# Patient Record
Sex: Female | Born: 1975 | Race: Black or African American | Hispanic: No | Marital: Married | Smoking: Former smoker
Health system: Southern US, Community
[De-identification: ages and names within clinical notes are randomized; demographics above are authoritative.]

## PROBLEM LIST (undated history)

## (undated) DIAGNOSIS — F329 Major depressive disorder, single episode, unspecified: Secondary | ICD-10-CM

## (undated) DIAGNOSIS — Z87440 Personal history of urinary (tract) infections: Secondary | ICD-10-CM

## (undated) DIAGNOSIS — J45909 Unspecified asthma, uncomplicated: Secondary | ICD-10-CM

## (undated) DIAGNOSIS — G4700 Insomnia, unspecified: Secondary | ICD-10-CM

## (undated) DIAGNOSIS — F419 Anxiety disorder, unspecified: Secondary | ICD-10-CM

## (undated) DIAGNOSIS — E559 Vitamin D deficiency, unspecified: Secondary | ICD-10-CM

## (undated) DIAGNOSIS — F411 Generalized anxiety disorder: Secondary | ICD-10-CM

## (undated) DIAGNOSIS — B009 Herpesviral infection, unspecified: Secondary | ICD-10-CM

## (undated) DIAGNOSIS — F32A Depression, unspecified: Secondary | ICD-10-CM

## (undated) DIAGNOSIS — Z8619 Personal history of other infectious and parasitic diseases: Secondary | ICD-10-CM

## (undated) DIAGNOSIS — E611 Iron deficiency: Secondary | ICD-10-CM

## (undated) DIAGNOSIS — D649 Anemia, unspecified: Secondary | ICD-10-CM

## (undated) DIAGNOSIS — E119 Type 2 diabetes mellitus without complications: Secondary | ICD-10-CM

## (undated) DIAGNOSIS — F3160 Bipolar disorder, current episode mixed, unspecified: Secondary | ICD-10-CM

## (undated) HISTORY — DX: Type 2 diabetes mellitus without complications: E11.9

## (undated) HISTORY — DX: Major depressive disorder, single episode, unspecified: F32.9

## (undated) HISTORY — DX: Personal history of other infectious and parasitic diseases: Z86.19

## (undated) HISTORY — DX: Herpesviral infection, unspecified: B00.9

## (undated) HISTORY — DX: Insomnia, unspecified: G47.00

## (undated) HISTORY — PX: BELPHAROPTOSIS REPAIR: SHX369

## (undated) HISTORY — DX: Vitamin D deficiency, unspecified: E55.9

## (undated) HISTORY — DX: Anxiety disorder, unspecified: F41.9

## (undated) HISTORY — DX: Iron deficiency: E61.1

## (undated) HISTORY — DX: Depression, unspecified: F32.A

## (undated) HISTORY — DX: Personal history of urinary (tract) infections: Z87.440

## (undated) HISTORY — DX: Anemia, unspecified: D64.9

---
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
## 1999-01-15 HISTORY — PX: DILATION AND CURETTAGE OF UTERUS: SHX78

## 2004-01-15 HISTORY — PX: TUBAL LIGATION: SHX77

## 2004-01-15 HISTORY — PX: ENDOMETRIAL ABLATION: SHX621

## 2013-05-05 ENCOUNTER — Ambulatory Visit (INDEPENDENT_AMBULATORY_CARE_PROVIDER_SITE_OTHER): Payer: 59 | Admitting: Adult Health

## 2013-05-05 ENCOUNTER — Telehealth: Payer: Self-pay | Admitting: Adult Health

## 2013-05-05 ENCOUNTER — Encounter: Payer: Self-pay | Admitting: Adult Health

## 2013-05-05 VITALS — BP 106/78 | HR 68 | Temp 98.0°F | Resp 14 | Ht 61.8 in | Wt 143.0 lb

## 2013-05-05 DIAGNOSIS — Z716 Tobacco abuse counseling: Secondary | ICD-10-CM | POA: Insufficient documentation

## 2013-05-05 DIAGNOSIS — F17200 Nicotine dependence, unspecified, uncomplicated: Secondary | ICD-10-CM

## 2013-05-05 DIAGNOSIS — Z7189 Other specified counseling: Secondary | ICD-10-CM

## 2013-05-05 DIAGNOSIS — Z8742 Personal history of other diseases of the female genital tract: Secondary | ICD-10-CM | POA: Insufficient documentation

## 2013-05-05 NOTE — Progress Notes (Signed)
Pre visit review using our clinic review tool, if applicable. No additional management support is needed unless otherwise documented below in the visit note. 

## 2013-05-05 NOTE — Progress Notes (Signed)
Patient ID: Jacqueline Padilla, female   DOB: Aug 20, 1975, 38 y.o.   MRN: [id number]   Subjective:    Patient ID: Jacqueline Padilla, female    DOB: Aug 20, 1975, 38 y.o.   MRN: [id number]  HPI Pt is a pleasant 38 y/o female who presents to clinic to establish care. She has moved to the [place] area approximately 1 year ago from [place]. Will request her records. Reports hx of abnormal PAP s/p endometrial ablation. She has had normal PAPs since. Last one was approximately 2012. She will schedule her complete physical exam. Ongoing tobacco abuse and is ready to quit. She is joining a group at her employer to stop smoking.    Past Medical History  Diagnosis Date  . History of chicken pox   . History of urinary tract infection      Past Surgical History  Procedure Laterality Date  . Dilation and curettage of uterus  2001    s/p miscarriage  . Tonsillectomy and adenoidectomy  1994  . Tubal ligation  2006  . Endometrial ablation  2006    hx of abnormal pap     Family History  Problem Relation Age of Onset  . Diabetes Mother   . Hypertension Father   . Stroke Maternal Grandmother   . Cancer Maternal Grandfather     Lung cancer - non smoker  . Cancer Paternal Grandmother     Breast cancer     History   Social History  . Marital Status: Married    Spouse Name: N/A    Number of Children: 1  . Years of Education: 15   Occupational History  . Unit [employer]     [employer]  . Residential Assistant     [employer]   Social History Main Topics  . Smoking status: Light Tobacco Smoker -- 0.25 packs/day for 18 years  . Smokeless tobacco: Not on file     Comment: She is going to [facility] "commit [facility] quit" [facility]  . Alcohol Use: Yes     Comment: Occassional glass of wine  . Drug Use: No  . Sexual Activity: Yes    Birth Control/ Protection: Surgical   Other Topics Concern  . Not on file   Social History Narrative   Jacqueline Padilla grew up in [place]. She lives in  [place] with her Husband and their son. Her husband has 3 daughters that live with them as well. 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 She enjoys spending time with the family, outdoor activities, shopping.      Exercise - not currently   Caffeine - 1 cup daily    Review of Systems  Constitutional: Negative.   HENT: Negative.   Eyes: Negative.   Respiratory: Negative.   Cardiovascular: Negative.   Gastrointestinal: Negative.   Endocrine: Negative.   Genitourinary: Negative.   Musculoskeletal: Negative.   Skin: Negative.   Allergic/Immunologic: Negative.   Neurological: Negative.   Hematological: Negative.   Psychiatric/Behavioral: Negative.        Objective:  BP 106/78  Pulse 68  Temp(Src) 98 F (36.7 C) (Oral)  Resp 14  Ht 5' 1.8" (1.57 m)  Wt 143 lb (64.864 kg)  BMI 26.32 kg/m2  SpO2 99%  LMP 03/25/2013   Physical Exam  Constitutional: She is oriented to person, place, and time. She appears well-developed and well-nourished. No distress.  HENT:  Head: Normocephalic and  atraumatic.  Right Ear: External ear normal.  Left Ear: External ear normal.  Nose: Nose normal.  Mouth/Throat: Oropharynx is clear and moist.  Eyes: Conjunctivae and EOM are normal. Pupils are equal, round, and reactive to light.  Neck: Normal range of motion. Neck supple.  Cardiovascular: Normal rate, regular rhythm, normal heart sounds and intact distal pulses.  Exam reveals no gallop and no friction rub.   No murmur heard. Pulmonary/Chest: Effort normal and breath sounds normal. No respiratory distress. She has no wheezes. She has no rales.  Musculoskeletal: Normal range of motion.  Neurological: She is alert and oriented to person, place, and time. Coordination normal.  Skin: Skin is warm and dry.  Psychiatric: She has a normal mood and affect. Her behavior is normal. Judgment and thought content normal.        Assessment & Plan:   1. Tobacco abuse counseling She has been a light smoker for 18 years. She has considered Chantix in the past but never followed through. Discussed importance of smoking cessation for her overall health and disease prevention. 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 Continue to encourage. She can consider trying chantix if she decides.  2. Hx of abnormal cervical Pap smear Hx of abnormal PAP s/p endometrial ablation. Will request records. She reports normal PAP following diagnosis. Last PAP 2012. Jacqueline Padilla will schedule a complete physical including PAP.

## 2013-05-05 NOTE — Telephone Encounter (Signed)
Relevant patient education mailed to patient.  

## 2013-05-05 NOTE — Patient Instructions (Signed)
   Thank you for choosing [place] [facility].  Please schedule your complete physical including PAP. I will check labs so you will need to be fasting.  Remember to activate your MyChart Account.

## 2013-05-21 ENCOUNTER — Encounter: Payer: 59 | Admitting: Adult Health

## 2013-11-22 ENCOUNTER — Telehealth: Payer: Self-pay

## 2013-11-22 NOTE — Telephone Encounter (Signed)
Called pt to set up with C.Doss as a new pt.  No answer, lvmom.

## 2014-01-28 ENCOUNTER — Encounter: Payer: 59 | Admitting: Nurse Practitioner

## 2014-02-07 ENCOUNTER — Encounter: Payer: 59 | Admitting: Nurse Practitioner

## 2014-02-14 ENCOUNTER — Encounter: Payer: 59 | Admitting: Nurse Practitioner

## 2014-03-07 ENCOUNTER — Other Ambulatory Visit (HOSPITAL_COMMUNITY)
Admission: RE | Admit: 2014-03-07 | Discharge: 2014-03-07 | Disposition: A | Discharge status: Home / Self Care | Payer: 59 | Source: Ambulatory Visit | Attending: Nurse Practitioner | Admitting: Nurse Practitioner

## 2014-03-07 ENCOUNTER — Encounter: Payer: Self-pay | Admitting: Nurse Practitioner

## 2014-03-07 ENCOUNTER — Ambulatory Visit (INDEPENDENT_AMBULATORY_CARE_PROVIDER_SITE_OTHER): Payer: 59 | Admitting: Nurse Practitioner

## 2014-03-07 VITALS — BP 110/80 | HR 63 | Temp 98.3°F | Resp 12 | Ht 61.8 in | Wt 151.8 lb

## 2014-03-07 DIAGNOSIS — Z01419 Encounter for gynecological examination (general) (routine) without abnormal findings: Secondary | ICD-10-CM | POA: Diagnosis not present

## 2014-03-07 DIAGNOSIS — Z716 Tobacco abuse counseling: Secondary | ICD-10-CM

## 2014-03-07 DIAGNOSIS — Z0000 Encounter for general adult medical examination without abnormal findings: Secondary | ICD-10-CM

## 2014-03-07 DIAGNOSIS — Z1151 Encounter for screening for human papillomavirus (HPV): Secondary | ICD-10-CM | POA: Diagnosis present

## 2014-03-07 DIAGNOSIS — N390 Urinary tract infection, site not specified: Secondary | ICD-10-CM

## 2014-03-07 LAB — POCT URINALYSIS DIPSTICK
BILIRUBIN UA: NEGATIVE
Blood, UA: NEGATIVE
Glucose, UA: NEGATIVE
Ketones, UA: NEGATIVE
Nitrite, UA: NEGATIVE
Spec Grav, UA: 1.01
Urobilinogen, UA: 0.2
pH, UA: 6

## 2014-03-07 MED ORDER — SULFAMETHOXAZOLE-TRIMETHOPRIM 800-160 MG PO TABS: 1.0000 | ORAL_TABLET | Freq: Two times a day (BID) | ORAL | Status: DC

## 2014-03-07 NOTE — Patient Instructions (Signed)

## 2014-03-07 NOTE — Progress Notes (Signed)
Subjective:    Patient ID: Jacqueline Padilla, female    DOB: [birth year], 39 y.o.   MRN: [id number]  HPI  Jacqueline Padilla is a 39 yo female here for an annual physical.   1) Health Maintenance-   Diet- No change  Exercise- Have not been to [employer] this month   Immunizations- Up to date  Pap- 4 years ago was normal, will do today   Eye Exam- Last year 2015  Dental Exam- Up to date   LMP- 02/13/2014 5 days   2) Chronic Problems-  Tobacco use- wants to stop smoking completely. Smokes in the car. When she gets really stressed, discussed going cold [place]key or using gum   3) Acute problems- Feels she may have a UTI, frequency and dysuria.   Review of Systems  Constitutional: Negative for fever, chills, diaphoresis, fatigue and unexpected weight change.  HENT: Negative for tinnitus and trouble swallowing.   Eyes: Negative for visual disturbance.  Respiratory: Negative for chest tightness, shortness of breath and wheezing.   Cardiovascular: Negative for chest pain, palpitations and leg swelling.  Gastrointestinal: Negative for nausea, vomiting, abdominal pain, diarrhea, constipation and blood in stool.  Endocrine: Negative for polydipsia, polyphagia and polyuria.  Genitourinary: Positive for dysuria. Negative for hematuria, vaginal discharge and vaginal pain.  Musculoskeletal: Negative for myalgias, back pain, arthralgias and gait problem.  Skin: Negative for color change and rash.  Neurological: Negative for dizziness, weakness, numbness and headaches.  Hematological: Does not bruise/bleed easily.  Psychiatric/Behavioral: Negative for suicidal ideas and sleep disturbance. The patient is not nervous/anxious.    Past Medical History  Diagnosis Date  . History of chicken pox   . History of urinary tract infection     History   Social History  . Marital Status: Married    Spouse Name: N/A  . Number of Children: 1  . Years of Education: 15   Occupational History  . Unit [employer]    [employer]  . Residential Assistant     [employer]   Social History Main Topics  . Smoking status: Light Tobacco Smoker -- 0.25 packs/day for 18 years  . Smokeless tobacco: Not on file     Comment: She is going to join "commit [facility] quit" at [facility]  . Alcohol Use: 0.0 oz/week    0 Standard drinks or equivalent per week     Comment: Occassional glass of wine  . Drug Use: No  . Sexual Activity:    Partners: Male    Birth Control/ Protection: Surgical     Comment: Husband   Other Topics Concern  . Not on file   Social History Narrative   [name] grew up [place]. She lives in [place] with her Husband and their son. Her husband has 3 daughters that live with them as well. 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 She enjoys spending time with the family, outdoor activities, shopping.      Exercise - not currently   Caffeine - 1 cup daily    Past Surgical History  Procedure Laterality Date  . Dilation and curettage of uterus  2001    s/p miscarriage  . Tonsillectomy and adenoidectomy  1994  . Tubal ligation  2006  . Endometrial ablation  2006    hx of abnormal pap    Family History  Problem Relation Age of Onset  . Diabetes Mother   .  Hypertension Father   . Stroke Maternal Grandmother   . Cancer Maternal Grandfather     Lung cancer - non smoker  . Cancer Paternal Grandmother     Breast cancer    No Known Allergies  Current Outpatient Prescriptions on File Prior to Visit  Medication Sig Dispense Refill  . cyclobenzaprine (FLEXERIL) 10 MG tablet Take 10 mg by mouth 3 (three) times daily as needed for muscle spasms.    . Multiple Vitamins-Minerals (MULTIVITAMIN GUMMIES ADULT PO) Take by mouth.     No current facility-administered medications on file prior to visit.      Objective:   Physical Exam  Constitutional: She is oriented to person, place, and time. She appears  well-developed and well-nourished. No distress.  BP 110/80 mmHg  Pulse 63  Temp(Src) 98.3 F (36.8 C) (Oral)  Resp 12  Ht 5' 1.8" (1.57 m)  Wt 151 lb 12.8 oz (68.856 kg)  BMI 27.93 kg/m2  SpO2 98%  LMP  (Approximate)   HENT:  Head: Normocephalic and atraumatic.  Right Ear: External ear normal.  Left Ear: External ear normal.  Nose: Nose normal.  Mouth/Throat: Oropharynx is clear and moist. No oropharyngeal exudate.  TMs and canals clear bilaterally  Eyes: Conjunctivae and EOM are normal. Pupils are equal, round, and reactive to light. Right eye exhibits no discharge. Left eye exhibits no discharge. No scleral icterus.  Neck: Normal range of motion. Neck supple. No thyromegaly present.  Cardiovascular: Normal rate, regular rhythm, normal heart sounds and intact distal pulses.  Exam reveals no gallop and no friction rub.   No murmur heard. Pulmonary/Chest: Effort normal and breath sounds normal. No respiratory distress. She has no wheezes. She has no rales. She exhibits no tenderness.  Abdominal: Soft. Bowel sounds are normal. She exhibits no distension and no mass. There is no tenderness. There is no rebound and no guarding.  Musculoskeletal: Normal range of motion. She exhibits no edema or tenderness.  Lymphadenopathy:    She has no cervical adenopathy.  Neurological: She is alert and oriented to person, place, and time. She has normal reflexes. No cranial nerve deficit. She exhibits normal muscle tone. Coordination normal.  Skin: Skin is warm and dry. No rash noted. She is not diaphoretic. No erythema. No pallor.  Psychiatric: She has a normal mood and affect. Her behavior is normal. Judgment and thought content normal.      Assessment & Plan:

## 2014-03-07 NOTE — Progress Notes (Signed)
Pre visit review using our clinic review tool, if applicable. No additional management support is needed unless otherwise documented below in the visit note. 

## 2014-03-08 DIAGNOSIS — N390 Urinary tract infection, site not specified: Secondary | ICD-10-CM | POA: Insufficient documentation

## 2014-03-08 DIAGNOSIS — Z0000 Encounter for general adult medical examination without abnormal findings: Secondary | ICD-10-CM | POA: Insufficient documentation

## 2014-03-08 DIAGNOSIS — E119 Type 2 diabetes mellitus without complications: Secondary | ICD-10-CM | POA: Insufficient documentation

## 2014-03-08 LAB — CBC WITH DIFFERENTIAL/PLATELET
BASOS PCT: 0.7 % (ref 0.0–3.0)
Basophils Absolute: 0 10*3/uL (ref 0.0–0.1)
Eosinophils Absolute: 0.1 10*3/uL (ref 0.0–0.7)
Eosinophils Relative: 2 % (ref 0.0–5.0)
HEMATOCRIT: 33.7 % — AB (ref 36.0–46.0)
Hemoglobin: 10.7 g/dL — ABNORMAL LOW (ref 12.0–15.0)
Lymphocytes Relative: 32.8 % (ref 12.0–46.0)
Lymphs Abs: 1.7 10*3/uL (ref 0.7–4.0)
MCHC: 31.7 g/dL (ref 30.0–36.0)
MCV: 65.5 fl — ABNORMAL LOW (ref 78.0–100.0)
MONOS PCT: 5.8 % (ref 3.0–12.0)
Monocytes Absolute: 0.3 10*3/uL (ref 0.1–1.0)
NEUTROS ABS: 3.1 10*3/uL (ref 1.4–7.7)
Neutrophils Relative %: 58.7 % (ref 43.0–77.0)
PLATELETS: 263 10*3/uL (ref 150.0–400.0)
RBC: 5.14 Mil/uL — ABNORMAL HIGH (ref 3.87–5.11)
RDW: 19.8 % — ABNORMAL HIGH (ref 11.5–15.5)
WBC: 5.3 10*3/uL (ref 4.0–10.5)

## 2014-03-08 LAB — LIPID PANEL
Cholesterol: 202 mg/dL — ABNORMAL HIGH (ref 0–200)
HDL: 57 mg/dL (ref >39.00)
LDL Cholesterol: 118 mg/dL — ABNORMAL HIGH (ref 0–99)
NONHDL: 145
TRIGLYCERIDES: 134 mg/dL (ref 0.0–149.0)
Total CHOL/HDL Ratio: 4
VLDL: 26.8 mg/dL (ref 0.0–40.0)

## 2014-03-08 LAB — COMPREHENSIVE METABOLIC PANEL
ALBUMIN: 4.4 g/dL (ref 3.5–5.2)
ALT: 13 U/L (ref 0–35)
AST: 22 U/L (ref 0–37)
Alkaline Phosphatase: 49 U/L (ref 39–117)
BUN: 7 mg/dL (ref 6–23)
CHLORIDE: 105 meq/L (ref 96–112)
CO2: 26 mEq/L (ref 19–32)
Calcium: 10.1 mg/dL (ref 8.4–10.5)
Creatinine, Ser: 0.79 mg/dL (ref 0.40–1.20)
GFR: 105.06 mL/min (ref >60.00)
Glucose, Bld: 90 mg/dL (ref 70–99)
POTASSIUM: 4.2 meq/L (ref 3.5–5.1)
SODIUM: 139 meq/L (ref 135–145)
Total Bilirubin: 0.4 mg/dL (ref 0.2–1.2)
Total Protein: 8.1 g/dL (ref 6.0–8.3)

## 2014-03-08 LAB — HEMOGLOBIN A1C: HEMOGLOBIN A1C: 6 % (ref 4.6–6.5)

## 2014-03-08 LAB — TSH: TSH: 1.45 u[IU]/mL (ref 0.35–4.50)

## 2014-03-08 NOTE — Assessment & Plan Note (Signed)
Discussed acute and chronic issues. Reviewed health maintenance measures, PFSHx, and immunizations. Obtain routine labs TSH, Lipid panel, CBC w/ diff, A1c, and CMET. Pap and breast exam performed today.

## 2014-03-08 NOTE — Assessment & Plan Note (Signed)
Pt interested in quitting smoking. Would like to try nicorette gum.

## 2014-03-08 NOTE — Assessment & Plan Note (Signed)
POCT urine probable for infection. Will start Bactrim twice daily for 5 days and obtain culture. Okay to take azo still. FU worsening/failure to improve.

## 2014-03-09 LAB — CULTURE, URINE COMPREHENSIVE
COLONY COUNT: NO GROWTH
Organism ID, Bacteria: NO GROWTH

## 2014-03-10 LAB — CYTOLOGY - PAP

## 2014-03-14 ENCOUNTER — Other Ambulatory Visit: Payer: Self-pay | Admitting: Nurse Practitioner

## 2014-03-14 ENCOUNTER — Telehealth: Payer: Self-pay | Admitting: Nurse Practitioner

## 2014-03-14 MED ORDER — METRONIDAZOLE 500 MG PO TABS: ORAL_TABLET | ORAL | Status: DC

## 2014-03-14 NOTE — Telephone Encounter (Signed)
See telephone note.

## 2015-04-27 ENCOUNTER — Encounter: Payer: 59 | Admitting: Nurse Practitioner

## 2015-05-17 ENCOUNTER — Encounter: Payer: 59 | Admitting: Nurse Practitioner

## 2015-05-25 ENCOUNTER — Encounter: Payer: Self-pay | Admitting: Physician Assistant

## 2015-05-25 ENCOUNTER — Ambulatory Visit: Payer: Self-pay | Admitting: Physician Assistant

## 2015-05-25 VITALS — BP 105/70 | HR 80 | Temp 98.6°F

## 2015-05-25 DIAGNOSIS — J029 Acute pharyngitis, unspecified: Secondary | ICD-10-CM

## 2015-05-25 MED ORDER — AZITHROMYCIN 250 MG PO TABS: ORAL_TABLET | ORAL | Status: DC

## 2015-05-25 NOTE — Progress Notes (Signed)
S: C/o sore throat and congestion for 3 days, no fever, chills, cp/sob, v/d; c/o of facial and dental pain. Mucus is clear, husband had same sx  Using otc meds:   O: PE: vitasl wnl, nad, perrl eomi, normocephalic, tms dull, nasal mucosa boggy and swollen, throat injected, neck supple no lymph, lungs c t a, cv rrr, neuro intact  A:  Acute pnd, pharyngitis   P: drink fluids, continue regular meds , use otc meds of choice, return if not improving in 5 days, return earlier if worsening , zpack if worsening over the weekend

## 2015-06-01 ENCOUNTER — Other Ambulatory Visit (HOSPITAL_COMMUNITY)
Admission: RE | Admit: 2015-06-01 | Discharge: 2015-06-01 | Disposition: A | Discharge status: Home / Self Care | Payer: 59 | Source: Ambulatory Visit | Attending: Family Medicine | Admitting: Family Medicine

## 2015-06-01 ENCOUNTER — Encounter: Payer: Self-pay | Admitting: Family Medicine

## 2015-06-01 ENCOUNTER — Ambulatory Visit (INDEPENDENT_AMBULATORY_CARE_PROVIDER_SITE_OTHER): Payer: 59 | Admitting: Family Medicine

## 2015-06-01 VITALS — BP 128/82 | HR 109 | Temp 98.6°F | Ht 61.8 in | Wt 157.8 lb

## 2015-06-01 DIAGNOSIS — N760 Acute vaginitis: Secondary | ICD-10-CM | POA: Diagnosis not present

## 2015-06-01 DIAGNOSIS — Z01419 Encounter for gynecological examination (general) (routine) without abnormal findings: Secondary | ICD-10-CM | POA: Insufficient documentation

## 2015-06-01 DIAGNOSIS — R7303 Prediabetes: Secondary | ICD-10-CM

## 2015-06-01 DIAGNOSIS — Z113 Encounter for screening for infections with a predominantly sexual mode of transmission: Secondary | ICD-10-CM | POA: Insufficient documentation

## 2015-06-01 DIAGNOSIS — Z1151 Encounter for screening for human papillomavirus (HPV): Secondary | ICD-10-CM | POA: Insufficient documentation

## 2015-06-01 DIAGNOSIS — E119 Type 2 diabetes mellitus without complications: Secondary | ICD-10-CM | POA: Insufficient documentation

## 2015-06-01 DIAGNOSIS — E1169 Type 2 diabetes mellitus with other specified complication: Secondary | ICD-10-CM | POA: Insufficient documentation

## 2015-06-01 DIAGNOSIS — E785 Hyperlipidemia, unspecified: Secondary | ICD-10-CM

## 2015-06-01 DIAGNOSIS — Z130 Encounter for screening for diseases of the blood and blood-forming organs and certain disorders involving the immune mechanism: Secondary | ICD-10-CM | POA: Diagnosis not present

## 2015-06-01 DIAGNOSIS — E782 Mixed hyperlipidemia: Secondary | ICD-10-CM | POA: Insufficient documentation

## 2015-06-01 DIAGNOSIS — Z0000 Encounter for general adult medical examination without abnormal findings: Secondary | ICD-10-CM | POA: Diagnosis not present

## 2015-06-01 DIAGNOSIS — Z124 Encounter for screening for malignant neoplasm of cervix: Secondary | ICD-10-CM | POA: Diagnosis not present

## 2015-06-01 DIAGNOSIS — E663 Overweight: Secondary | ICD-10-CM

## 2015-06-01 DIAGNOSIS — E118 Type 2 diabetes mellitus with unspecified complications: Secondary | ICD-10-CM | POA: Insufficient documentation

## 2015-06-01 MED ORDER — BUPROPION HCL ER (SR) 150 MG PO TB12: ORAL_TABLET | ORAL | Status: DC

## 2015-06-01 NOTE — Progress Notes (Signed)
Subjective:  Patient ID: Jacqueline Padilla, female    DOB: October 11, 1975  Age: 40 y.o. MRN: [id number]  CC: Annual exam w/ pap  HPI Jacqueline Padilla is a 40 y.o. female presents to the clinic today for an annual exam.  Preventative Healthcare  Pap smear: Pap smear last year was inadequate; needs pap today.  Immunizations - Up to date except for pneumovax given that she is a smoker.  Labs: Screening labs today.  Exercise: Exercising regularly.  Alcohol use: Occasional.  Smoking/tobacco use: Current smoker. States that she is ready to quit. Will discuss treatment options today.  STD/HIV testing: She and her husband separated previously and she had trich on pap smear. She is concerned about infidelity and potential STD. She desires screening today.  Regular dental exams: Yes.  PMH, Surgical Hx, Family Hx, Social History reviewed and updated as below.  Past Medical History  Diagnosis Date  . History of chicken pox   . History of urinary tract infection    Past Surgical History  Procedure Laterality Date  . Dilation and curettage of uterus  2001    s/p miscarriage  . Tonsillectomy and adenoidectomy  1994  . Tubal ligation  2006  . Endometrial ablation  2006    hx of abnormal pap   Family History  Problem Relation Age of Onset  . Diabetes Mother   . Hypertension Father   . Stroke Maternal Grandmother   . Cancer Maternal Grandfather     Lung cancer - non smoker  . Cancer Paternal Grandmother     Breast cancer   Social History  Substance Use Topics  . Smoking status: Light Tobacco Smoker -- 0.25 packs/day for 18 years  . Smokeless tobacco: Not on file  . Alcohol Use: 0.0 oz/week    0 Standard drinks or equivalent per week     Comment: Occassional glass of wine   Review of Systems  Genitourinary:       Concern about possible STD.  All other systems reviewed and are negative.  Objective:   Today's Vitals: BP 128/82 mmHg  Pulse 109  Temp(Src) 98.6 F (37  C) (Oral)  Ht 5' 1.8" (1.57 m)  Wt 157 lb 12 oz (71.555 kg)  BMI 29.03 kg/m2  SpO2 98%  Physical Exam  Constitutional: She is oriented to person, place, and time. She appears well-developed and well-nourished. No distress.  HENT:  Head: Normocephalic and atraumatic.  Nose: Nose normal.  Mouth/Throat: Oropharynx is clear and moist. No oropharyngeal exudate.  Normal TM's bilaterally.   Eyes: Conjunctivae are normal. No scleral icterus.  Neck: Neck supple. No thyromegaly present.  Cardiovascular: Normal rate and regular rhythm.   No murmur heard. Pulmonary/Chest: Effort normal and breath sounds normal. She has no wheezes. She has no rales.  Breasts: breasts appear normal, no appreciable masses, no skin or nipple changes or axillary nodes.   Abdominal: Soft. She exhibits no distension. There is no tenderness. There is no rebound and no guarding.  Genitourinary:  Pelvic Exam: External: normal female genitalia without lesions or masses Vagina: no lesions or masses. Discharge noted. Cervix: normal without lesions or masses Pap smear performed.   Musculoskeletal: Normal range of motion. She exhibits no edema.  Lymphadenopathy:    She has no cervical adenopathy.  Neurological: She is alert and oriented to person, place, and time.  Skin: Skin is warm and dry. No rash noted.  Psychiatric: She has a normal mood and affect.  Vitals reviewed.  Assessment &  Plan:   Problem List Items Addressed This Visit    Well woman exam with routine gynecological exam - Primary    Pap smear performed today. STD testing today. Screening labs today. Counseled on smoking cessation briefly (<3 mins). Discuss options and patient elected to start Wellbutrin.       Prediabetes   Relevant Orders   HgB A1c   Hyperlipidemia   Relevant Orders   Lipid Profile    Other Visit Diagnoses    Cervical cancer screening        Relevant Orders    Cytology - PAP    Screening for STD (sexually transmitted  disease)        Relevant Orders    HIV antibody (with reflex)    Hepatitis C Antibody    RPR    Overweight (BMI 25.0-29.9)        Relevant Orders    Comp Met (CMET)    Screening for deficiency anemia        Relevant Orders    CBC       Outpatient Encounter Prescriptions as of 06/01/2015  Medication Sig  . buPROPion (WELLBUTRIN SR) 150 MG 12 hr tablet 1 tablet daily x 3 days. Then increase to 1 tablet twice daily.  . [DISCONTINUED] azithromycin (ZITHROMAX) 250 MG tablet 2 pills today then 1 pill a day for 4 days   No facility-administered encounter medications on file as of 06/01/2015.    Follow-up: Annually or sooner if needed.   [place]

## 2015-06-01 NOTE — Patient Instructions (Addendum)
Take the Wellbutrin as prescribed. Pick a quit day and stop smoking during the second week of treatment.  We will call the laboratory studies.  Follow up annually or sooner if needed.  Take care  Dr. Adriana Simasook

## 2015-06-01 NOTE — Progress Notes (Signed)
Pre visit review using our clinic review tool, if applicable. No additional management support is needed unless otherwise documented below in the visit note. 

## 2015-06-01 NOTE — Assessment & Plan Note (Signed)
Pap smear performed today. STD testing today. Screening labs today. Counseled on smoking cessation briefly (<3 mins). Discuss options and patient elected to start Wellbutrin.

## 2015-06-02 LAB — LIPID PANEL
CHOL/HDL RATIO: 4
Cholesterol: 173 mg/dL (ref 0–200)
HDL: 41.3 mg/dL (ref >39.00)
LDL CALC: 99 mg/dL (ref 0–99)
NonHDL: 131.55
Triglycerides: 162 mg/dL — ABNORMAL HIGH (ref 0.0–149.0)
VLDL: 32.4 mg/dL (ref 0.0–40.0)

## 2015-06-02 LAB — COMPREHENSIVE METABOLIC PANEL
ALBUMIN: 4.3 g/dL (ref 3.5–5.2)
ALT: 19 U/L (ref 0–35)
AST: 25 U/L (ref 0–37)
Alkaline Phosphatase: 43 U/L (ref 39–117)
BUN: 8 mg/dL (ref 6–23)
CHLORIDE: 103 meq/L (ref 96–112)
CO2: 25 meq/L (ref 19–32)
CREATININE: 0.73 mg/dL (ref 0.40–1.20)
Calcium: 9.5 mg/dL (ref 8.4–10.5)
GFR: 114.33 mL/min (ref >60.00)
Glucose, Bld: 118 mg/dL — ABNORMAL HIGH (ref 70–99)
Potassium: 3.5 mEq/L (ref 3.5–5.1)
Sodium: 136 mEq/L (ref 135–145)
Total Bilirubin: 0.3 mg/dL (ref 0.2–1.2)
Total Protein: 7.1 g/dL (ref 6.0–8.3)

## 2015-06-02 LAB — HEPATITIS C ANTIBODY: HCV Ab: NEGATIVE

## 2015-06-02 LAB — CBC
HCT: 31.1 % — ABNORMAL LOW (ref 36.0–46.0)
Hemoglobin: 9.9 g/dL — ABNORMAL LOW (ref 12.0–15.0)
MCHC: 31.7 g/dL (ref 30.0–36.0)
MCV: 67.4 fl — ABNORMAL LOW (ref 78.0–100.0)
PLATELETS: 314 10*3/uL (ref 150.0–400.0)
RBC: 4.62 Mil/uL (ref 3.87–5.11)
RDW: 20.4 % — ABNORMAL HIGH (ref 11.5–15.5)
WBC: 5.4 10*3/uL (ref 4.0–10.5)

## 2015-06-02 LAB — RPR

## 2015-06-02 LAB — HIV ANTIBODY (ROUTINE TESTING W REFLEX): HIV: NONREACTIVE

## 2015-06-02 LAB — HEMOGLOBIN A1C: HEMOGLOBIN A1C: 6 % (ref 4.6–6.5)

## 2015-06-06 ENCOUNTER — Other Ambulatory Visit: Payer: Self-pay | Admitting: Family Medicine

## 2015-06-06 LAB — CYTOLOGY - PAP

## 2015-06-06 MED ORDER — METRONIDAZOLE 500 MG PO TABS: 2000.0000 mg | ORAL_TABLET | Freq: Once | ORAL | Status: DC

## 2015-06-07 ENCOUNTER — Telehealth: Payer: Self-pay | Admitting: *Deleted

## 2015-06-07 ENCOUNTER — Other Ambulatory Visit: Payer: Self-pay | Admitting: Family Medicine

## 2015-06-07 LAB — CERVICOVAGINAL ANCILLARY ONLY
CANDIDA VAGINITIS: NEGATIVE
HERPES (WINDOWPATH): NEGATIVE

## 2015-06-07 MED ORDER — METRONIDAZOLE 500 MG PO TABS: 500.0000 mg | ORAL_TABLET | Freq: Two times a day (BID) | ORAL | Status: DC

## 2015-06-07 NOTE — Telephone Encounter (Signed)
Spoke to patient. Gave lab results and instructions. Patient verbalized understanding.  

## 2015-06-07 NOTE — Telephone Encounter (Signed)
Patient has requested a call in return in regards to labs from 05/18

## 2015-06-29 ENCOUNTER — Ambulatory Visit: Payer: Self-pay | Admitting: Family Medicine

## 2016-06-05 ENCOUNTER — Encounter: Payer: Self-pay | Admitting: Family Medicine

## 2016-06-19 ENCOUNTER — Encounter: Payer: Self-pay | Admitting: Family Medicine

## 2016-10-24 ENCOUNTER — Encounter: Payer: Self-pay | Admitting: Physician Assistant

## 2016-10-24 ENCOUNTER — Ambulatory Visit: Payer: Self-pay | Admitting: Physician Assistant

## 2016-10-24 VITALS — BP 132/80 | HR 76 | Temp 98.3°F

## 2016-10-24 DIAGNOSIS — H6980 Other specified disorders of Eustachian tube, unspecified ear: Secondary | ICD-10-CM

## 2016-10-24 MED ORDER — FLUTICASONE PROPIONATE 50 MCG/ACT NA SUSP
2.0000 | Freq: Every day | NASAL | 6 refills | Status: DC
Start: 2016-10-24 — End: 2017-05-30

## 2016-10-24 NOTE — Progress Notes (Signed)
S:  C/o ears popping and being stopped up, no drainage from ears, no fever/chills, no cough or congestion, some sinus pressure, remainder ros neg, used nyquil Using otc meds without relief  O:  Vitals wnl, nad, tms dull b/l, nasal mucosa swollen, throat wnl, neck supple no lymph, lungs c t a, cv rrr, neuro intact  A: acute eustachean tube dysfunction  P: flonase, sudafed, return if not improving in 3 to 5 days, return earlier if worsening

## 2017-03-26 DIAGNOSIS — J028 Acute pharyngitis due to other specified organisms: Secondary | ICD-10-CM | POA: Diagnosis not present

## 2017-03-26 DIAGNOSIS — J029 Acute pharyngitis, unspecified: Secondary | ICD-10-CM | POA: Diagnosis not present

## 2017-04-01 DIAGNOSIS — J209 Acute bronchitis, unspecified: Secondary | ICD-10-CM | POA: Diagnosis not present

## 2017-04-01 DIAGNOSIS — J0100 Acute maxillary sinusitis, unspecified: Secondary | ICD-10-CM | POA: Diagnosis not present

## 2017-04-23 ENCOUNTER — Ambulatory Visit: Payer: Self-pay | Admitting: Family

## 2017-04-23 ENCOUNTER — Telehealth: Payer: Self-pay | Admitting: *Deleted

## 2017-04-23 DIAGNOSIS — Z0289 Encounter for other administrative examinations: Secondary | ICD-10-CM

## 2017-04-23 NOTE — Telephone Encounter (Signed)
Copied from CRM [phone]. Topic: Quick Communication - Appointment Cancellation >> Apr 23, 2017  8:40 AM Diana EvesHoyt, Maryann B wrote: Patient called to cancel appointment scheduled for 04/23/17. Patient has rescheduled their appointment.  Route to department's PEC pool.

## 2017-04-25 NOTE — Telephone Encounter (Signed)
Noted 04/23/17

## 2017-05-14 ENCOUNTER — Ambulatory Visit: Payer: Self-pay | Admitting: Family

## 2017-05-30 ENCOUNTER — Ambulatory Visit (INDEPENDENT_AMBULATORY_CARE_PROVIDER_SITE_OTHER): Payer: 59 | Admitting: Family

## 2017-05-30 ENCOUNTER — Encounter: Payer: Self-pay | Admitting: Family

## 2017-05-30 VITALS — BP 134/80 | HR 85 | Temp 98.1°F | Resp 18 | Wt 156.6 lb

## 2017-05-30 DIAGNOSIS — Z716 Tobacco abuse counseling: Secondary | ICD-10-CM | POA: Diagnosis not present

## 2017-05-30 DIAGNOSIS — R7303 Prediabetes: Secondary | ICD-10-CM | POA: Diagnosis not present

## 2017-05-30 DIAGNOSIS — Z202 Contact with and (suspected) exposure to infections with a predominantly sexual mode of transmission: Secondary | ICD-10-CM

## 2017-05-30 DIAGNOSIS — E782 Mixed hyperlipidemia: Secondary | ICD-10-CM

## 2017-05-30 DIAGNOSIS — M62838 Other muscle spasm: Secondary | ICD-10-CM | POA: Diagnosis not present

## 2017-05-30 MED ORDER — VARENICLINE TARTRATE 0.5 MG X 11 & 1 MG X 42 PO MISC: ORAL | 0 refills | Status: DC

## 2017-05-30 MED ORDER — CYCLOBENZAPRINE HCL 5 MG PO TABS: 5.0000 mg | ORAL_TABLET | Freq: Every evening | ORAL | 1 refills | Status: DC | PRN

## 2017-05-30 NOTE — Patient Instructions (Addendum)
Come back and see if muscle spasm recurs as would to do further evaluation.   Labs when fasting  Pleasure meeting you!   The below is how you take Chantix.   Select a quit date within 7 days of starting Chantix. Goal is you should stop smoking within 8 to 35 days of starting chantix  Initial: Days 1 to 3: 0.5 mg by mouth once daily Days 4 to 7: 0.5 mg by mouth twice daily   Maintenance (? Day 8): 1 mg by mouth twice daily for 11 weeks. We may consider a temporary or permanent dose reduction if usual dose of 1 mg twice per day is not tolerated.  If you are not able or willing to quit abruptly, begin treatment with vareniciline ( Chantix) and reduce smoking by 50% from baseline within the first 4 weeks, by an additional 50% in the next 4 weeks, and continue reducing with the goal of complete abstinence by 12 weeks. If successfully quits smoking at the end of the 12 weeks, may continue for another 12 weeks to help maintain success. If you are motivated to quit and do not succeed in stopping smoking during prior therapy, or relapse after treatment, I would encourage you to make another attempt with varenicline ( Chantix) once factors contributing to the failed attempt have been identified and addressed.

## 2017-05-30 NOTE — Assessment & Plan Note (Signed)
Has done well on chantix in the past. This was her preferred medication and the strange dreams were not bothersome. No si/hi.  Refilled for patient.

## 2017-05-30 NOTE — Assessment & Plan Note (Signed)
Chronic. Intermittent. Refilled flexeril for patient. No formal work up; advised if recurs to let me know. She verbalized understanding.

## 2017-05-30 NOTE — Progress Notes (Signed)
Subjective:    Patient ID: Jacqueline Padilla, female    DOB: 1975-06-11, 42 y.o.   MRN: [id number]  CC: Jacqueline Padilla is a 42 y.o. female who presents today for follow up.   HPI: Feels well. No complaints today.   Continues to smoke and would like to quit. Tried wellbutrin, chantix in the past.  Preferred chantix however started to get vivid dreams. No si/hi. Would like to try again.   Muscle spasm in upper back and arms from time to time. No injury at work, perhaps typing at computer. No numbness. 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 H/o of spouse having an affair. No change in vaginal discharge or new partner.   Would like CPE labs today including labs for thyroid, anemia, vitamin D      HISTORY:  Past Medical History:  Diagnosis Date  . History of chicken pox   . History of urinary tract infection    Past Surgical History:  Procedure Laterality Date  . DILATION AND CURETTAGE OF UTERUS  2001   s/p miscarriage  . ENDOMETRIAL ABLATION  2006   hx of abnormal pap  . TONSILLECTOMY AND ADENOIDECTOMY  1994  . TUBAL LIGATION  2006   Family History  Problem Relation Age of Onset  . Diabetes Mother   . Hypertension Father   . Cancer Maternal Grandfather        Lung cancer - non smoker  . Cancer Paternal Grandmother        Breast cancer  . Stroke Maternal Grandmother     Allergies: Patient has no known allergies. No current outpatient medications on file prior to visit.   No current facility-administered medications on file prior to visit.     Social History   Tobacco Use  . Smoking status: Light Tobacco Smoker    Packs/day: 0.25    Years: 18.00    Pack years: 4.50  . Smokeless tobacco: Never Used  Substance Use Topics  . Alcohol use: Yes    Alcohol/week: 0.0 oz    Comment: Occassional glass of wine  . Drug use: No    Review of Systems  Constitutional: Negative for chills and fever.  Respiratory:  Negative for cough.   Cardiovascular: Negative for chest pain and palpitations.  Gastrointestinal: Negative for nausea and vomiting.  Musculoskeletal: Positive for myalgias (upper arms and back).      Objective:    BP 134/80 (BP Location: Left Arm, Patient Position: Sitting, Cuff Size: Normal)   Pulse 85   Temp 98.1 F (36.7 C) (Oral)   Resp 18   Wt 156 lb 9.6 oz (71 kg)   SpO2 99%   BMI 28.83 kg/m  BP Readings from Last 3 Encounters:  05/30/17 134/80  10/24/16 132/80  06/01/15 128/82   Wt Readings from Last 3 Encounters:  05/30/17 156 lb 9.6 oz (71 kg)  06/01/15 157 lb 12 oz (71.6 kg)  03/07/14 151 lb 12.8 oz (68.9 kg)    Physical Exam  Constitutional: She appears well-developed and well-nourished.  Eyes: Conjunctivae are normal.  Cardiovascular: Normal rate, regular rhythm, normal heart sounds and normal pulses.  Pulmonary/Chest: Effort normal and breath sounds normal. She has no wheezes. She has no rhonchi. She has no rales.  Neurological: She is alert.  Skin: Skin is warm and dry.  Psychiatric: She has a normal mood and affect. Her speech is normal and behavior is normal. Thought content normal.  Vitals reviewed.      Assessment & Plan:   Problem List Items Addressed This Visit      Other   Encounter for smoking cessation counseling - Primary    Has done well on chantix in the past. This was her preferred medication and the strange dreams were not bothersome. No si/hi.  Refilled for patient.       Relevant Medications   varenicline (CHANTIX STARTING MONTH PAK) 0.5 MG X 11 & 1 MG X 42 tablet   Hyperlipidemia   Prediabetes   Relevant Orders   CBC with Differential/Platelet   Comprehensive metabolic panel   Hemoglobin A1c   Lipid panel   TSH   VITAMIN D 25 Hydroxy (Vit-D Deficiency, Fractures)   STD exposure    Labs ordered.       Relevant Orders   Urine cytology ancillary only   Acute Hep Panel & Hep B Surface Ab   RPR   HIV antibody   Muscle  spasm    Chronic. Intermittent. Refilled flexeril for patient. No formal work up; advised if recurs to let me know. She verbalized understanding.       Relevant Medications   cyclobenzaprine (FLEXERIL) 5 MG tablet       I have discontinued Rhia Rozas's buPROPion, metroNIDAZOLE, metroNIDAZOLE, and fluticasone. I am also having her start on cyclobenzaprine and varenicline.   Meds ordered this encounter  Medications  . cyclobenzaprine (FLEXERIL) 5 MG tablet    Sig: Take 1 tablet (5 mg total) by mouth at bedtime as needed for muscle spasms.    Dispense:  30 tablet    Refill:  1    Order Specific Question:   Supervising Provider    Answer:   Duncan Dull L [[id number]]  . varenicline (CHANTIX STARTING MONTH PAK) 0.5 MG X 11 & 1 MG X 42 tablet    Sig: Take one 0.5 mg tablet by mouth once daily for 3 days, then increase to one 0.5 mg tablet twice daily for 4 days, then increase to one 1 mg tablet twice daily.    Dispense:  53 tablet    Refill:  0    Order Specific Question:   Supervising Provider    Answer:   Sherlene Shams [[id number]]    Return precautions given.   Risks, benefits, and alternatives of the medications and treatment plan prescribed today were discussed, and patient expressed understanding.   Education regarding symptom management and diagnosis given to patient on AVS.  Continue to follow with McLean-Scocuzza, Pasty Spillers, MD for routine health maintenance.   Jacqueline Padilla and I agreed with plan.   Rennie Plowman, FNP

## 2017-05-30 NOTE — Assessment & Plan Note (Signed)
Labs ordered.

## 2017-06-16 ENCOUNTER — Ambulatory Visit: Payer: Self-pay

## 2017-06-16 NOTE — Telephone Encounter (Signed)
Pt c/o anxiety and feeling keyed up, tightness in stomach and trouble concentrating. Her BP was elevated and feels she has been having the sx for the past 6 months but just noted it lately. Pt states she feels like she takes on too much responsibilities at work and at home. Denies suicidal or homicidal ideation. Pt drinks 1 cup of coffee per day and drinks a beer or wine occasionally. Care advice given and appt made for pt tomorrow.  Reason for Disposition . [1] Symptoms of anxiety or panic AND [2] has not been evaluated for this by physician  Answer Assessment - Initial Assessment Questions 1. CONCERN: "What happened that made you call today?"     anxiety 2. ANXIETY SYMPTOM SCREENING: "Can you describe how you have been feeling?"  (e.g., tense, restless, panicky, anxious, keyed up, trouble sleeping, trouble concentrating)     Tense, anxious, keyed up, tightness in stomach, trouble concentrating, eye twitching 3. ONSET: "How long have you been feeling this way?"     6 months or more initially didn't notice sx 4. RECURRENT: "Have you felt this way before?"  If yes: "What happened that time?" "What helped these feelings go away in the past?"      Yes-felt heart rate increasing- pt stated that it would go away 5. RISK OF HARM - SUICIDAL IDEATION:  "Do you ever have thoughts of hurting or killing yourself?"  (e.g., yes, no, no but preoccupation with thoughts about death)   - INTENT:  "Do you have thoughts of hurting or killing yourself right NOW?" (e.g., yes, no, N/A)   - PLAN: "Do you have a specific plan for how you would do this?" (e.g., gun, knife, overdose, no plan, N/A)     no 6. RISK OF HARM - HOMICIDAL IDEATION:  "Do you ever have thoughts of hurting or killing someone else?"  (e.g., yes, no, no but preoccupation with thoughts about death)   - INTENT:  "Do you have thoughts of hurting or killing someone right NOW?" (e.g., yes, no, N/A)   - PLAN: "Do you have a specific plan for how you would  do this?" (e.g., gun, knife, no plan, N/A)      no 7. FUNCTIONAL IMPAIRMENT: "How have things been going for you overall in your life? Have you had any more difficulties than usual doing your normal daily activities?"  (e.g., better, same, worse; self-care, school, work, interactions)     More stress at work and increased responsibilities at work and home--no more difficulties that usual 8. SUPPORT: "Who is with you now?" "Who do you live with?" "Do you have family or friends nearby who you can talk to?"      At work- lives with husband and son  77. THERAPIST: "Do you have a counselor or therapist? Name?"     no 10. STRESSORS: "Has there been any new stress or recent changes in your life?"       no 11. CAFFEINE ABUSE: "Do you drink caffeinated beverages, and how much each day?" (e.g., coffee, tea, colas)       1 cup coffee 12. SUBSTANCE ABUSE: "Do you use any illegal drugs or alcohol?"       No - 1 beer or wine occasionally 13. OTHER SYMPTOMS: "Do you have any other physical symptoms right now?" (e.g., chest pain, palpitations, difficulty breathing, fever)       Elevated BP, feels heart rate increasing and tightness to stomach and heart pounding, feels nervous at the time  14. PREGNANCY: "Is there any chance you are pregnant?" "When was your last menstrual period?"       Tube tied-May 16 th 2019  Protocols used: ANXIETY AND PANIC ATTACK-A-AH

## 2017-06-17 ENCOUNTER — Ambulatory Visit: Payer: Self-pay | Admitting: Family Medicine

## 2017-06-18 ENCOUNTER — Encounter: Payer: Self-pay | Admitting: Internal Medicine

## 2017-06-18 ENCOUNTER — Ambulatory Visit (INDEPENDENT_AMBULATORY_CARE_PROVIDER_SITE_OTHER): Payer: 59 | Admitting: Internal Medicine

## 2017-06-18 VITALS — BP 142/92 | HR 99 | Temp 98.4°F | Ht 61.8 in | Wt 157.0 lb

## 2017-06-18 DIAGNOSIS — F419 Anxiety disorder, unspecified: Secondary | ICD-10-CM | POA: Insufficient documentation

## 2017-06-18 DIAGNOSIS — Z1329 Encounter for screening for other suspected endocrine disorder: Secondary | ICD-10-CM

## 2017-06-18 DIAGNOSIS — L659 Nonscarring hair loss, unspecified: Secondary | ICD-10-CM | POA: Diagnosis not present

## 2017-06-18 DIAGNOSIS — R030 Elevated blood-pressure reading, without diagnosis of hypertension: Secondary | ICD-10-CM

## 2017-06-18 DIAGNOSIS — F329 Major depressive disorder, single episode, unspecified: Secondary | ICD-10-CM

## 2017-06-18 DIAGNOSIS — Z0184 Encounter for antibody response examination: Secondary | ICD-10-CM

## 2017-06-18 DIAGNOSIS — R7303 Prediabetes: Secondary | ICD-10-CM | POA: Diagnosis not present

## 2017-06-18 DIAGNOSIS — E559 Vitamin D deficiency, unspecified: Secondary | ICD-10-CM

## 2017-06-18 DIAGNOSIS — D649 Anemia, unspecified: Secondary | ICD-10-CM | POA: Diagnosis not present

## 2017-06-18 DIAGNOSIS — Z720 Tobacco use: Secondary | ICD-10-CM

## 2017-06-18 DIAGNOSIS — E785 Hyperlipidemia, unspecified: Secondary | ICD-10-CM

## 2017-06-18 DIAGNOSIS — Z1231 Encounter for screening mammogram for malignant neoplasm of breast: Secondary | ICD-10-CM

## 2017-06-18 DIAGNOSIS — Z1159 Encounter for screening for other viral diseases: Secondary | ICD-10-CM

## 2017-06-18 DIAGNOSIS — Z113 Encounter for screening for infections with a predominantly sexual mode of transmission: Secondary | ICD-10-CM | POA: Diagnosis not present

## 2017-06-18 NOTE — Patient Instructions (Addendum)
Mediation apps insight timer, calm or headspace  Next consider therapy or medications  sch fasting labs Try Biotin Iron ferrous sulfate 325 mg 1-2 xper day with stool softner, warm prune juice  Try Giovanni tea tree shampoo  Please sch mammogram    Living With Anxiety After being diagnosed with an anxiety disorder, you may be relieved to know why you have felt or behaved a certain way. It is natural to also feel overwhelmed about the treatment ahead and what it will mean for your life. With care and support, you can manage this condition and recover from it. How to cope with anxiety Dealing with stress Stress is your body's reaction to life changes and events, both good and bad. Stress can last just a few hours or it can be ongoing. Stress can play a major role in anxiety, so it is important to learn both how to cope with stress and how to think about it differently. Talk with your health care provider or a counselor to learn more about stress reduction. He or she may suggest some stress reduction techniques, such as:  Music therapy. This can include creating or listening to music that you enjoy and that inspires you.  Mindfulness-based meditation. This involves being aware of your normal breaths, rather than trying to control your breathing. It can be done while sitting or walking.  Centering prayer. This is a kind of meditation that involves focusing on a word, phrase, or sacred image that is meaningful to you and that brings you peace.  Deep breathing. To do this, expand your stomach and inhale slowly through your nose. Hold your breath for 3-5 seconds. Then exhale slowly, allowing your stomach muscles to relax.  Self-talk. This is a skill where you identify thought patterns that lead to anxiety reactions and correct those thoughts.  Muscle relaxation. This involves tensing muscles then relaxing them.  Choose a stress reduction technique that fits your lifestyle and personality. Stress  reduction techniques take time and practice. Set aside 5-15 minutes a day to do them. Therapists can offer training in these techniques. The training may be covered by some insurance plans. Other things you can do to manage stress include:  Keeping a stress diary. This can help you learn what triggers your stress and ways to control your response.  Thinking about how you respond to certain situations. You may not be able to control everything, but you can control your reaction.  Making time for activities that help you relax, and not feeling guilty about spending your time in this way.  Therapy combined with coping and stress-reduction skills provides the best chance for successful treatment. Medicines Medicines can help ease symptoms. Medicines for anxiety include:  Anti-anxiety drugs.  Antidepressants.  Beta-blockers.  Medicines may be used as the main treatment for anxiety disorder, along with therapy, or if other treatments are not working. Medicines should be prescribed by a health care provider. Relationships Relationships can play a big part in helping you recover. Try to spend more time connecting with trusted friends and family members. Consider going to couples counseling, taking family education classes, or going to family therapy. Therapy can help you and others better understand the condition. How to recognize changes in your condition Everyone has a different response to treatment for anxiety. Recovery from anxiety happens when symptoms decrease and stop interfering with your daily activities at home or work. This may mean that you will start to:  Have better concentration and focus.  Sleep  better.  Be less irritable.  Have more energy.  Have improved memory.  It is important to recognize when your condition is getting worse. Contact your health care provider if your symptoms interfere with home or work and you do not feel like your condition is improving. Where to  find help and support: You can get help and support from these sources:  Self-help groups.  Online and [employer]community organizations.  A trusted spiritual leader.  Couples counseling.  Family education classes.  Family therapy.  Follow these instructions at home:  Eat a healthy diet that includes plenty of vegetables, fruits, whole grains, low-fat dairy products, and lean protein. Do not eat a lot of foods that are high in solid fats, added sugars, or salt.  Exercise. Most adults should do the following: ? Exercise for at least 150 minutes each week. The exercise should increase your heart rate and make you sweat (moderate-intensity exercise). ? Strengthening exercises at least twice a week.  Cut down on caffeine, tobacco, alcohol, and other potentially harmful substances.  Get the right amount and quality of sleep. Most adults need 7-9 hours of sleep each night.  Make choices that simplify your life.  Take over-the-counter and prescription medicines only as told by your health care provider.  Avoid caffeine, alcohol, and certain over-the-counter cold medicines. These may make you feel worse. Ask your pharmacist which medicines to avoid.  Keep all follow-up visits as told by your health care provider. This is important. Questions to ask your health care provider  Would I benefit from therapy?  How often should I follow up with a health care provider?  How long do I need to take medicine?  Are there any long-term side effects of my medicine?  Are there any alternatives to taking medicine? Contact a health care provider if:  You have a hard time staying focused or finishing daily tasks.  You spend many hours a day feeling worried about everyday life.  You become exhausted by worry.  You start to have headaches, feel tense, or have nausea.  You urinate more than normal.  You have diarrhea. Get help right away if:  You have a racing heart and shortness of  breath.  You have thoughts of hurting yourself or others. If you ever feel like you may hurt yourself or others, or have thoughts about taking your own life, get help right away. You can go to your nearest emergency department or call:  Your local emergency services (911 in the [place]  A suicide crisis helpline, such as the National Suicide Prevention Lifeline at 914-814-74681-206-549-4442. This is open 24-hours a day.  Summary  Taking steps to deal with stress can help calm you.  Medicines cannot cure anxiety disorders, but they can help ease symptoms.  Family, friends, and partners can play a big part in helping you recover from an anxiety disorder. This information is not intended to replace advice given to you by your health care provider. Make sure you discuss any questions you have with your health care provider. Document Released: 12/26/2015 Document Revised: 12/26/2015 Document Reviewed: 12/26/2015 Elsevier Interactive Patient Education  [employer]2018 Elsevier Inc.

## 2017-06-18 NOTE — Addendum Note (Signed)
Addended by: Quentin OreMCLEAN-SCOCUZZA, Katherin Ramey on: 06/18/2017 01:34 PM   Modules accepted: Orders

## 2017-06-18 NOTE — Progress Notes (Signed)
Chief Complaint  Patient presents with  . Anxiety   F/u  1. C/o anxiety and depression FH of mood d/o not on meds and wants to think about medications phq 9 score 18 today. C/o reduced focus, panic, being nervous Sunday before visit, SOB and heart racing  2. Elevated BP today checking x 2 weeks and dbp 99 highest. She had cup of coffee qd  3. Tobacco abuse smoking 3 cig qd she has not started Chantix  4. H/o anemia  5. C/o hair loss   Review of Systems  Constitutional: Negative for weight loss.  HENT: Negative.  Negative for hearing loss.   Eyes: Negative for blurred vision.  Respiratory: Negative for shortness of breath.   Cardiovascular: Positive for palpitations.  Gastrointestinal: Negative for abdominal pain.  Musculoskeletal: Negative for falls.  Skin: Negative for rash.       +hair loss  Neurological: Negative for headaches.  Psychiatric/Behavioral: Positive for depression. Negative for suicidal ideas. The patient is nervous/anxious and has insomnia.    Past Medical History:  Diagnosis Date  . History of chicken pox   . History of urinary tract infection    Past Surgical History:  Procedure Laterality Date  . DILATION AND CURETTAGE OF UTERUS  2001   s/p miscarriage  . ENDOMETRIAL ABLATION  2006   hx of abnormal pap  . TONSILLECTOMY AND ADENOIDECTOMY  1994  . TUBAL LIGATION  2006   Family History  Problem Relation Age of Onset  . Diabetes Mother   . Hypertension Father   . Cancer Maternal Grandfather        Lung cancer - non smoker  . Cancer Paternal Grandmother        Breast cancer  . Stroke Maternal Grandmother    Social History   Socioeconomic History  . Marital status: Married    Spouse name: Not on file  . Number of children: 1  . Years of education: 7615  . Highest education level: Not on file  Occupational History  . 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 Occupation: Advertising copywriteresidential Assistant    Comment: [employer]  Social Needs  .  Financial resource strain: Not on file  . Food insecurity:    Worry: Not on file    Inability: Not on file  . Transportation needs:    Medical: Not on file    Non-medical: Not on file  Tobacco Use  . Smoking status: Light Tobacco Smoker    Packs/day: 0.25    Years: 18.00    Pack years: 4.50  . Smokeless tobacco: Never Used  Substance and Sexual Activity  . Alcohol use: Yes    Alcohol/week: 0.0 oz    Comment: Occassional glass of wine  . Drug use: No  . Sexual activity: Yes    Partners: Male    Birth control/protection: Surgical    Comment: Husband  Lifestyle  . Physical activity:    Days per week: Not on file    Minutes per session: Not on file  . Stress: Not on file  Relationships  . Social connections:    Talks on phone: Not on file    Gets together: Not on file    Attends religious service: Not on file    Active member of club or organization: Not on file    Attends meetings of clubs or organizations: Not on file    Relationship status: Not on file  . Intimate partner violence:  Fear of current or ex partner: Not on file    Emotionally abused: Not on file    Physically abused: Not on file    Forced sexual activity: Not on file  Other Topics Concern  . 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 She lives in [place] with her Husband and their son. Her husband has 3 daughters that live with them as well. She works at [employer] as a [employer] on [employer]. 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 She enjoys spending time with the family, outdoor activities, shopping.      Exercise - not currently   Caffeine - 1 cup daily   Current Meds  Medication Sig  . cyclobenzaprine (FLEXERIL) 5 MG tablet Take 1 tablet (5 mg total) by mouth at bedtime as needed for muscle spasms.  . varenicline (CHANTIX STARTING MONTH PAK) 0.5 MG X 11 & 1 MG X 42 tablet Take one 0.5 mg tablet by mouth once daily for 3 days, then increase to  one 0.5 mg tablet twice daily for 4 days, then increase to one 1 mg tablet twice daily.   No Known Allergies No results found for this or any previous visit (from the past 2160 hour(s)). Objective  Body mass index is 28.9 kg/m. Wt Readings from Last 3 Encounters:  06/18/17 157 lb (71.2 kg)  05/30/17 156 lb 9.6 oz (71 kg)  06/01/15 157 lb 12 oz (71.6 kg)   Temp Readings from Last 3 Encounters:  06/18/17 98.4 F (36.9 C) (Oral)  05/30/17 98.1 F (36.7 C) (Oral)  10/24/16 98.3 F (36.8 C)   BP Readings from Last 3 Encounters:  06/18/17 (!) 142/92  05/30/17 134/80  10/24/16 132/80   Pulse Readings from Last 3 Encounters:  06/18/17 99  05/30/17 85  10/24/16 76    Physical Exam  Constitutional: She is oriented to person, place, and time. She appears well-developed and well-nourished. She is cooperative.  HENT:  Head: Normocephalic and atraumatic.  Mouth/Throat: Oropharynx is clear and moist and mucous membranes are normal.  Eyes: Pupils are equal, round, and reactive to light. Conjunctivae are normal.  Cardiovascular: Normal rate, regular rhythm and normal heart sounds.  Pulmonary/Chest: Effort normal and breath sounds normal.  Neurological: She is alert and oriented to person, place, and time. Gait normal.  Skin: Skin is warm, dry and intact.  Psychiatric: She has a normal mood and affect. Her speech is normal and behavior is normal. Judgment and thought content normal. Cognition and memory are normal.  Nursing note and vitals reviewed.   Assessment   1. Anxiety and depression PHq 9 score 18 today 2. Elevated BP  3. Tobacco abuse  4. Anemia  5. Hair loss  6. HM Plan   1.  Referral to [facility] hold on meds for now  2. Recheck at f/u  3. rec cessation smoking 3 cig per day  Has not started chantix  4. Check labs b/f next visit  5. Given supplement recs and shampoo  6.  Had flu shot  Tdap had  Check labs before next visit  Pap at f/u last 06/01/15 neg neg hpv   Referred mammo rec smoking cessation  Records Dr Lenice Pressman pap and endometrial bx   Provider: Dr. French Ana McLean-Scocuzza-[facility]

## 2017-06-18 NOTE — Progress Notes (Signed)
Pre visit review using our clinic review tool, if applicable. No additional management support is needed unless otherwise documented below in the visit note. 

## 2017-06-19 ENCOUNTER — Other Ambulatory Visit (HOSPITAL_COMMUNITY)
Admission: RE | Admit: 2017-06-19 | Discharge: 2017-06-19 | Disposition: A | Discharge status: Home / Self Care | Payer: 59 | Source: Ambulatory Visit | Attending: Internal Medicine | Admitting: Internal Medicine

## 2017-06-19 ENCOUNTER — Other Ambulatory Visit (INDEPENDENT_AMBULATORY_CARE_PROVIDER_SITE_OTHER): Payer: 59

## 2017-06-19 DIAGNOSIS — D649 Anemia, unspecified: Secondary | ICD-10-CM | POA: Diagnosis not present

## 2017-06-19 DIAGNOSIS — Z113 Encounter for screening for infections with a predominantly sexual mode of transmission: Secondary | ICD-10-CM

## 2017-06-19 DIAGNOSIS — R7303 Prediabetes: Secondary | ICD-10-CM

## 2017-06-19 DIAGNOSIS — Z0184 Encounter for antibody response examination: Secondary | ICD-10-CM

## 2017-06-19 DIAGNOSIS — R030 Elevated blood-pressure reading, without diagnosis of hypertension: Secondary | ICD-10-CM | POA: Diagnosis not present

## 2017-06-19 DIAGNOSIS — E785 Hyperlipidemia, unspecified: Secondary | ICD-10-CM

## 2017-06-19 DIAGNOSIS — F329 Major depressive disorder, single episode, unspecified: Secondary | ICD-10-CM

## 2017-06-19 DIAGNOSIS — E559 Vitamin D deficiency, unspecified: Secondary | ICD-10-CM

## 2017-06-19 DIAGNOSIS — Z1159 Encounter for screening for other viral diseases: Secondary | ICD-10-CM

## 2017-06-19 DIAGNOSIS — F419 Anxiety disorder, unspecified: Secondary | ICD-10-CM | POA: Diagnosis not present

## 2017-06-19 DIAGNOSIS — Z1329 Encounter for screening for other suspected endocrine disorder: Secondary | ICD-10-CM

## 2017-06-19 NOTE — Addendum Note (Signed)
Addended by: Penne LashWIGGINS, Dailon Sheeran N on: 06/19/2017 10:07 AM   Modules accepted: Orders

## 2017-06-19 NOTE — Addendum Note (Signed)
Addended by: Penne LashWIGGINS, Karson Chicas N on: 06/19/2017 10:08 AM   Modules accepted: Orders

## 2017-06-20 LAB — URINALYSIS, ROUTINE W REFLEX MICROSCOPIC
BILIRUBIN UA: NEGATIVE
Glucose, UA: NEGATIVE
KETONES UA: NEGATIVE
LEUKOCYTES UA: NEGATIVE
Nitrite, UA: NEGATIVE
PROTEIN UA: NEGATIVE
RBC UA: NEGATIVE
SPEC GRAV UA: 1.008 (ref 1.005–1.030)
Urobilinogen, Ur: 0.2 mg/dL (ref 0.2–1.0)
pH, UA: 7 (ref 5.0–7.5)

## 2017-06-20 LAB — COMPREHENSIVE METABOLIC PANEL
AG Ratio: 1.4 (calc) (ref 1.0–2.5)
ALT: 13 U/L (ref 6–29)
AST: 19 U/L (ref 10–30)
Albumin: 4.2 g/dL (ref 3.6–5.1)
Alkaline phosphatase (APISO): 48 U/L (ref 33–115)
BUN: 7 mg/dL (ref 7–25)
CO2: 24 mmol/L (ref 20–32)
CREATININE: 0.7 mg/dL (ref 0.50–1.10)
Calcium: 8.9 mg/dL (ref 8.6–10.2)
Chloride: 104 mmol/L (ref 98–110)
GLUCOSE: 97 mg/dL (ref 65–99)
Globulin: 3 g/dL (calc) (ref 1.9–3.7)
Potassium: 3.9 mmol/L (ref 3.5–5.3)
Sodium: 137 mmol/L (ref 135–146)
Total Bilirubin: 0.5 mg/dL (ref 0.2–1.2)
Total Protein: 7.2 g/dL (ref 6.1–8.1)

## 2017-06-20 LAB — HEPATITIS C ANTIBODY
HEP C AB: NONREACTIVE
SIGNAL TO CUT-OFF: 0.02 (ref <1.00)

## 2017-06-20 LAB — CBC WITH DIFFERENTIAL/PLATELET
Basophils Absolute: 72 cells/uL (ref 0–200)
Basophils Relative: 1.5 %
Eosinophils Absolute: 163 cells/uL (ref 15–500)
Eosinophils Relative: 3.4 %
HEMATOCRIT: 28.1 % — AB (ref 35.0–45.0)
Hemoglobin: 8.4 g/dL — ABNORMAL LOW (ref 11.7–15.5)
Lymphs Abs: 1642 cells/uL (ref 850–3900)
MCH: 18.3 pg — ABNORMAL LOW (ref 27.0–33.0)
MCHC: 29.9 g/dL — AB (ref 32.0–36.0)
MCV: 61.1 fL — ABNORMAL LOW (ref 80.0–100.0)
MONOS PCT: 6.5 %
MPV: 9.5 fL (ref 7.5–12.5)
NEUTROS PCT: 54.4 %
Neutro Abs: 2611 cells/uL (ref 1500–7800)
Platelets: 375 10*3/uL (ref 140–400)
RBC: 4.6 10*6/uL (ref 3.80–5.10)
RDW: 19.6 % — AB (ref 11.0–15.0)
TOTAL LYMPHOCYTE: 34.2 %
WBC: 4.8 10*3/uL (ref 3.8–10.8)
WBCMIX: 312 {cells}/uL (ref 200–950)

## 2017-06-20 LAB — HEMOGLOBIN A1C
Hgb A1c MFr Bld: 6 % of total Hgb — ABNORMAL HIGH (ref <5.7)
Mean Plasma Glucose: 126 (calc)
eAG (mmol/L): 7 (calc)

## 2017-06-20 LAB — T4, FREE: Free T4: 0.9 ng/dL (ref 0.8–1.8)

## 2017-06-20 LAB — IRON,TIBC AND FERRITIN PANEL
%SAT: 6 % — AB (ref 11–50)
Ferritin: 4 ng/mL — ABNORMAL LOW (ref 10–232)
Iron: 26 ug/dL — ABNORMAL LOW (ref 40–190)
TIBC: 469 mcg/dL (calc) — ABNORMAL HIGH (ref 250–450)

## 2017-06-20 LAB — URINE CYTOLOGY ANCILLARY ONLY
CHLAMYDIA, DNA PROBE: NEGATIVE
NEISSERIA GONORRHEA: NEGATIVE
Trichomonas: NEGATIVE

## 2017-06-20 LAB — LIPID PANEL
CHOL/HDL RATIO: 4 (calc) (ref <5.0)
CHOLESTEROL: 189 mg/dL (ref <200)
HDL: 47 mg/dL — AB (ref >50)
LDL CHOLESTEROL (CALC): 126 mg/dL — AB
Non-HDL Cholesterol (Calc): 142 mg/dL (calc) — ABNORMAL HIGH (ref <130)
Triglycerides: 68 mg/dL (ref <150)

## 2017-06-20 LAB — MEASLES/MUMPS/RUBELLA IMMUNITY
MUMPS IGG: 107 [AU]/ml
RUBELLA: 4.35 {index}
RUBEOLA IGG: 149 [AU]/ml

## 2017-06-20 LAB — VITAMIN D 25 HYDROXY (VIT D DEFICIENCY, FRACTURES): Vit D, 25-Hydroxy: 9 ng/mL — ABNORMAL LOW (ref 30–100)

## 2017-06-20 LAB — RPR: RPR: NONREACTIVE

## 2017-06-20 LAB — HEPATITIS B SURFACE ANTIGEN: Hepatitis B Surface Ag: NONREACTIVE

## 2017-06-20 LAB — HSV 2 ANTIBODY, IGG: HSV 2 Glycoprotein G Ab, IgG: 14.6 index — ABNORMAL HIGH

## 2017-06-20 LAB — TSH: TSH: 1.15 mIU/L

## 2017-06-20 LAB — HSV 1 ANTIBODY, IGG

## 2017-06-20 LAB — HIV ANTIBODY (ROUTINE TESTING W REFLEX): HIV 1&2 Ab, 4th Generation: NONREACTIVE

## 2017-06-20 LAB — HEPATITIS B SURFACE ANTIBODY, QUANTITATIVE: Hep B S AB Quant (Post): 799 m[IU]/mL (ref >=10)

## 2017-06-23 ENCOUNTER — Other Ambulatory Visit: Payer: Self-pay | Admitting: Internal Medicine

## 2017-06-23 DIAGNOSIS — B9689 Other specified bacterial agents as the cause of diseases classified elsewhere: Secondary | ICD-10-CM

## 2017-06-23 DIAGNOSIS — E559 Vitamin D deficiency, unspecified: Secondary | ICD-10-CM | POA: Insufficient documentation

## 2017-06-23 DIAGNOSIS — N760 Acute vaginitis: Secondary | ICD-10-CM

## 2017-06-23 LAB — URINE CYTOLOGY ANCILLARY ONLY: Candida vaginitis: NEGATIVE

## 2017-06-23 MED ORDER — METRONIDAZOLE 500 MG PO TABS
500.0000 mg | ORAL_TABLET | Freq: Two times a day (BID) | ORAL | 0 refills | Status: DC
Start: 2017-06-23 — End: 2017-07-10

## 2017-06-23 MED ORDER — CHOLECALCIFEROL 1.25 MG (50000 UT) PO CAPS: 50000.0000 [IU] | ORAL_CAPSULE | ORAL | 1 refills | Status: DC

## 2017-07-10 ENCOUNTER — Ambulatory Visit (INDEPENDENT_AMBULATORY_CARE_PROVIDER_SITE_OTHER): Payer: 59 | Admitting: Internal Medicine

## 2017-07-10 ENCOUNTER — Encounter: Payer: Self-pay | Admitting: Internal Medicine

## 2017-07-10 VITALS — BP 110/84 | HR 91 | Temp 98.2°F | Ht 61.8 in | Wt 155.8 lb

## 2017-07-10 DIAGNOSIS — E559 Vitamin D deficiency, unspecified: Secondary | ICD-10-CM

## 2017-07-10 DIAGNOSIS — B3731 Acute candidiasis of vulva and vagina: Secondary | ICD-10-CM

## 2017-07-10 DIAGNOSIS — F411 Generalized anxiety disorder: Secondary | ICD-10-CM | POA: Diagnosis not present

## 2017-07-10 DIAGNOSIS — M79671 Pain in right foot: Secondary | ICD-10-CM

## 2017-07-10 DIAGNOSIS — B373 Candidiasis of vulva and vagina: Secondary | ICD-10-CM | POA: Diagnosis not present

## 2017-07-10 DIAGNOSIS — Z0000 Encounter for general adult medical examination without abnormal findings: Secondary | ICD-10-CM | POA: Diagnosis not present

## 2017-07-10 DIAGNOSIS — B009 Herpesviral infection, unspecified: Secondary | ICD-10-CM

## 2017-07-10 DIAGNOSIS — D509 Iron deficiency anemia, unspecified: Secondary | ICD-10-CM | POA: Diagnosis not present

## 2017-07-10 MED ORDER — FLUCONAZOLE 150 MG PO TABS: 150.0000 mg | ORAL_TABLET | Freq: Once | ORAL | 0 refills | Status: AC

## 2017-07-10 MED ORDER — SERTRALINE HCL 25 MG PO TABS: 25.0000 mg | ORAL_TABLET | Freq: Every day | ORAL | 1 refills | Status: DC

## 2017-07-10 NOTE — Progress Notes (Signed)
Pre visit review using our clinic review tool, if applicable. No additional management support is needed unless otherwise documented below in the visit note. 

## 2017-07-10 NOTE — Progress Notes (Signed)
Chief Complaint  Patient presents with  . Annual Exam   Annual  1. Anxiety last visit did not want meds today wants meds and has not made appt with [facility]  2. C/o right heel pain worse wearing flat shoes  3. 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 C/o vaginal itching after taking flagyl and being at the beach  5. She is taking chantix and causing reduced cravings cig   Review of Systems  Constitutional: Negative for weight loss.  HENT: Negative for hearing loss.   Eyes: Negative for blurred vision.  Respiratory: Negative for shortness of breath.   Cardiovascular: Negative for chest pain.  Gastrointestinal: Positive for constipation and diarrhea.  Genitourinary:       +vag itching  Musculoskeletal: Positive for joint pain.       Right heel pain  Skin: Negative for rash.  Neurological: Negative for headaches.  Psychiatric/Behavioral: The patient is nervous/anxious.    Past Medical History:  Diagnosis Date  . Anxiety   . History of chicken pox   . History of urinary tract infection    Past Surgical History:  Procedure Laterality Date  . DILATION AND CURETTAGE OF UTERUS  2001   s/p miscarriage  . ENDOMETRIAL ABLATION  2006   hx of abnormal pap  . TONSILLECTOMY AND ADENOIDECTOMY  1994  . TUBAL LIGATION  2006   Family History  Problem Relation Age of Onset  . Diabetes Mother   . Hypertension Mother   . Hypertension Father   . Heart disease Father        CABG  . Cancer Maternal Grandfather        Lung cancer - non smoker  . Cancer Paternal Grandmother        Breast cancer  . Bipolar disorder Sister   . Stroke Maternal Grandmother   . Cancer Maternal Grandmother        breast cancer   . Bipolar disorder Cousin    Social History   Socioeconomic History  . Marital status: Married    Spouse name: Not on file  . Number of children: 1  . Years of  education: 85  . Highest education level: Not on file  Occupational History  . Occupation: [employer]    Comment: [employer] [employer]  . Occupation: Associate Professor    Comment: [employer]  . Financial resource strain: Not on file  . Food insecurity:    Worry: Not on file    Inability: Not on file  . Transportation needs:    Medical: Not on file    Non-medical: Not on file  Tobacco Use  . Smoking status: Light Tobacco Smoker    Packs/day: 0.25    Years: 18.00    Pack years: 4.50  . Smokeless tobacco: Never Used  Substance and Sexual Activity  . Alcohol use: Yes    Alcohol/week: 0.0 oz    Comment: Occassional glass of wine  . Drug use: No  . Sexual activity: Yes    Partners: Male    Birth control/protection: Surgical    Comment: Husband  Lifestyle  . Physical activity:    Days per week: Not on file    Minutes per session: Not on file  . Stress: Not on file  Relationships  . Social connections:    Talks on phone: Not on file  Gets together: Not on file    Attends religious service: Not on file    Active member of club or organization: Not on file    Attends meetings of clubs or organizations: Not on file    Relationship status: Not on file  . Intimate partner violence:    Fear of current or ex partner: Not on file    Emotionally abused: Not on file    Physically abused: Not on file    Forced sexual activity: Not on file  Other Topics Concern  . Not on file  Social History Narrative   [name] grew up in [place], [place]. She lives in [place] with her Husband and their son. Her husband has 3 daughters that live with them as well. She has 46 y.o son as of 06/2017. 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 She enjoys spending time with the family, outdoor activities, shopping.   Married x 16 years       Exercise - not currently   Caffeine - 1 cup daily    Current Meds  Medication Sig  . Cholecalciferol 50000 units capsule Take 1 capsule (50,000 Units total) by mouth once a week.  . cyclobenzaprine (FLEXERIL) 5 MG tablet Take 1 tablet (5 mg total) by mouth at bedtime as needed for muscle spasms.  . varenicline (CHANTIX STARTING MONTH PAK) 0.5 MG X 11 & 1 MG X 42 tablet Take one 0.5 mg tablet by mouth once daily for 3 days, then increase to one 0.5 mg tablet twice daily for 4 days, then increase to one 1 mg tablet twice daily.   No Known Allergies Recent Results (from the past 2160 hour(s))  Urine cytology ancillary only     Status: None   Collection Time: 06/19/17 12:00 AM  Result Value Ref Range   Chlamydia Negative     Comment: Normal Reference Range - Negative   Neisseria gonorrhea Negative     Comment: Normal Reference Range - Negative   Trichomonas Negative     Comment: Normal Reference Range - Negative  Urine cytology ancillary only     Status: Abnormal   Collection Time: 06/19/17 12:00 AM  Result Value Ref Range   Bacterial vaginitis (A)     **POSITIVE for Atopobium vaginae, POSITIVE for Megasphaera 2, POSITIVE for Megasphaera 1, POSITIVE for Gardnerella vaginalis, POSITIVE for BVAB2**    Comment: Normal Reference Range - Negative   Candida vaginitis Negative for Candida Vaginitis Microorganisms     Comment: Normal Reference Range - Negative  HSV 2 antibody, IgG     Status: Abnormal   Collection Time: 06/19/17 10:08 AM  Result Value Ref Range   HSV 2 Glycoprotein G Ab, IgG 14.60 (H) index    Comment:                           Index          Interpretation                           -----          --------------                           <0.90          Negative  0.90-1.09      Equivocal                           >1.09          Positive . This assay utilizes recombinant type-specific antigens to differentiate HSV-1 from HSV-2 infections. A positive result cannot distinguish between recent and past  infection. If recent HSV infection is suspected but the results are negative or equivocal, the assay should be repeated in 4-6 weeks. The performance characteristics of the assay have not been established for pediatric populations, immunocompromised patients, or neonatal screening.   HSV 1 antibody, IgG     Status: Abnormal   Collection Time: 06/19/17 10:08 AM  Result Value Ref Range   HSV 1 Glycoprotein G Ab, IgG >58.00 (H) index    Comment:                           Index          Interpretation                           -----          --------------                           <0.90          Negative                           0.90-1.09      Equivocal                           >1.09          Positive . This assay utilizes recombinant type-specific antigens to differentiate HSV-1 from HSV-2 infections. A positive result cannot distinguish between recent and past infection. If recent HSV infection is suspected but the results are negative or equivocal, the assay should be repeated in 4-6 weeks. The performance characteristics of the assay have not been established for pediatric populations, immunocompromised patients, or neonatal screening.   Hepatitis B surface antigen     Status: None   Collection Time: 06/19/17 10:08 AM  Result Value Ref Range   Hepatitis B Surface Ag NON-REACTIVE NON-REACTI  Hepatitis C antibody     Status: None   Collection Time: 06/19/17 10:08 AM  Result Value Ref Range   Hepatitis C Ab NON-REACTIVE NON-REACTI   SIGNAL TO CUT-OFF 0.02 <1.00    Comment: . HCV antibody was non-reactive. There is no laboratory  evidence of HCV infection. . In most cases, no further action is required. However, if recent HCV exposure is suspected, a test for HCV RNA (test code [id number]) is suggested. . For additional information please refer to http://education.[facility].com/faq/FAQ22v1 (This link is being provided for informational/ educational purposes only.) .    Hepatitis B surface antibody     Status: None   Collection Time: 06/19/17 10:08 AM  Result Value Ref Range   Hepatitis B-Post 799 > OR = 10 mIU/mL    Comment: . Patient has immunity to hepatitis B virus. . For additional information, please refer to http://education.[facility].com/faq/FAQ105 (This link is being provided for informational/ educational purposes only).   HIV antibody (with reflex)     Status: None   Collection  Time: 06/19/17 10:08 AM  Result Value Ref Range   HIV 1&2 Ab, 4th Generation NON-REACTIVE NON-REACTI    Comment: HIV-1 antigen and HIV-1/HIV-2 antibodies were not detected. There is no laboratory evidence of HIV infection. Marland Kitchen PLEASE NOTE: This information has been disclosed to you from records whose confidentiality may be protected by state law.  If your state requires such protection, then the state law prohibits you from making any further disclosure of the information without the specific written consent of the person to whom it pertains, or as otherwise permitted by law. A general authorization for the release of medical or other information is NOT sufficient for this purpose. . For additional information please refer to http://education.[facility].com/faq/FAQ106 (This link is being provided for informational/ educational purposes only.) . Marland Kitchen The performance of this assay has not been clinically validated in patients less than 77 years old. .   RPR     Status: None   Collection Time: 06/19/17 10:08 AM  Result Value Ref Range   RPR Ser Ql NON-REACTIVE NON-REACTI  Measles/Mumps/Rubella Immunity     Status: None   Collection Time: 06/19/17 10:08 AM  Result Value Ref Range   Rubeola IgG 149.00 AU/mL    Comment: AU/mL            Interpretation -----            -------------- <25.00           Negative 25.00-29.99      Equivocal >29.99           Positive . A positive result indicates that the patient has antibody to measles virus. It  does not differentiate  between an active or past infection. The clinical  diagnosis must be interpreted in conjunction with  clinical signs and symptoms of the patient.    Mumps IgG 107.00 AU/mL    Comment:  AU/mL           Interpretation -------         ---------------- <9.00             Negative 9.00-10.99        Equivocal >10.99            Positive A positive result indicates that the patient has  antibody to mumps virus. It does not differentiate between an  active or past infection. The clinical diagnosis must be interpreted in conjunction with clinical signs and symptoms of the patient. .    Rubella 4.35 index    Comment:     Index            Interpretation     -----            --------------       <0.90            Not consistent with Immunity     0.90-0.99        Equivocal     > or = 1.00      Consistent with Immunity  . The presence of rubella IgG antibody suggests  immunization or past or current infection with rubella virus.   Comprehensive metabolic panel     Status: None   Collection Time: 06/19/17 10:08 AM  Result Value Ref Range   Glucose, Bld 97 65 - 99 mg/dL    Comment: .            Fasting reference interval .    BUN 7 7 - 25 mg/dL   Creat  0.70 0.50 - 1.10 mg/dL   BUN/Creatinine Ratio NOT APPLICABLE 6 - 22 (calc)   Sodium 137 135 - 146 mmol/L   Potassium 3.9 3.5 - 5.3 mmol/L   Chloride 104 98 - 110 mmol/L   CO2 24 20 - 32 mmol/L   Calcium 8.9 8.6 - 10.2 mg/dL   Total Protein 7.2 6.1 - 8.1 g/dL   Albumin 4.2 3.6 - 5.1 g/dL   Globulin 3.0 1.9 - 3.7 g/dL (calc)   AG Ratio 1.4 1.0 - 2.5 (calc)   Total Bilirubin 0.5 0.2 - 1.2 mg/dL   Alkaline phosphatase (APISO) 48 33 - 115 U/L   AST 19 10 - 30 U/L   ALT 13 6 - 29 U/L  CBC with Differential/Platelet     Status: Abnormal   Collection Time: 06/19/17 10:08 AM  Result Value Ref Range   WBC 4.8 3.8 - 10.8 Thousand/uL   RBC 4.60 3.80 - 5.10 Million/uL   Hemoglobin 8.4 (L) 11.7 - 15.5 g/dL   HCT 28.1  (L) 35.0 - 45.0 %   MCV 61.1 (L) 80.0 - 100.0 fL   MCH 18.3 (L) 27.0 - 33.0 pg   MCHC 29.9 (L) 32.0 - 36.0 g/dL   RDW 19.6 (H) 11.0 - 15.0 %   Platelets 375 140 - 400 Thousand/uL   MPV 9.5 7.5 - 12.5 fL   Neutro Abs 2,611 1,500 - 7,800 cells/uL   Lymphs Abs 1,642 850 - 3,900 cells/uL   WBC mixed population 312 200 - 950 cells/uL   Eosinophils Absolute 163 15 - 500 cells/uL   Basophils Absolute 72 0 - 200 cells/uL   Neutrophils Relative % 54.4 %   Total Lymphocyte 34.2 %   Monocytes Relative 6.5 %   Eosinophils Relative 3.4 %   Basophils Relative 1.5 %   Smear Review      Comment: Review of peripheral smear confirms automated results.   Iron, TIBC and Ferritin Panel     Status: Abnormal   Collection Time: 06/19/17 10:08 AM  Result Value Ref Range   Iron 26 (L) 40 - 190 mcg/dL   TIBC 469 (H) 250 - 450 mcg/dL (calc)   %SAT 6 (L) 11 - 50 % (calc)   Ferritin 4 (L) 10 - 232 ng/mL  Lipid panel     Status: Abnormal   Collection Time: 06/19/17 10:08 AM  Result Value Ref Range   Cholesterol 189 <200 mg/dL   HDL 47 (L) >50 mg/dL   Triglycerides 68 <150 mg/dL   LDL Cholesterol (Calc) 126 (H) mg/dL (calc)    Comment: Reference range: <100 . Desirable range <100 mg/dL for primary prevention;   <70 mg/dL for patients with CHD or diabetic patients  with > or = 2 CHD risk factors. Marland Kitchen LDL-C is now calculated using the Martin-Hopkins  calculation, which is a validated novel method providing  better accuracy than the Friedewald equation in the  estimation of LDL-C.  Cresenciano Genre et al. Annamaria Helling. 0174;944(96): 2061-2068  (http://education.[facility].com/faq/FAQ164)    Total CHOL/HDL Ratio 4.0 <5.0 (calc)   Non-HDL Cholesterol (Calc) 142 (H) <130 mg/dL (calc)    Comment: For patients with diabetes plus 1 major ASCVD risk  factor, treating to a non-HDL-C goal of <100 mg/dL  (LDL-C of <70 mg/dL) is considered a therapeutic  option.   T4, free     Status: None   Collection Time: 06/19/17  10:08 AM  Result Value Ref Range   Free T4 0.9 0.8 - 1.8 ng/dL  TSH     Status: None   Collection Time: 06/19/17 10:08 AM  Result Value Ref Range   TSH 1.15 mIU/L    Comment:           Reference Range .           > or = 20 Years  0.40-4.50 .                Pregnancy Ranges           First trimester    0.26-2.66           Second trimester   0.55-2.73           Third trimester    0.43-2.91   Hemoglobin A1c     Status: Abnormal   Collection Time: 06/19/17 10:08 AM  Result Value Ref Range   Hgb A1c MFr Bld 6.0 (H) <5.7 % of total Hgb    Comment: For someone without known diabetes, a hemoglobin  A1c value between 5.7% and 6.4% is consistent with prediabetes and should be confirmed with a  follow-up test. . For someone with known diabetes, a value <7% indicates that their diabetes is well controlled. A1c targets should be individualized based on duration of diabetes, age, comorbid conditions, and other considerations. . This assay result is consistent with an increased risk of diabetes. . Currently, no consensus exists regarding use of hemoglobin A1c for diagnosis of diabetes for children. .    Mean Plasma Glucose 126 (calc)   eAG (mmol/L) 7.0 (calc)  VITAMIN D 25 Hydroxy (Vit-D Deficiency, Fractures)     Status: Abnormal   Collection Time: 06/19/17 10:08 AM  Result Value Ref Range   Vit D, 25-Hydroxy 9 (L) 30 - 100 ng/mL    Comment: Vitamin D Status         25-OH Vitamin D: . Deficiency:                    <20 ng/mL Insufficiency:             20 - 29 ng/mL Optimal:                 > or = 30 ng/mL . For 25-OH Vitamin D testing on patients on  D2-supplementation and patients for whom quantitation  of D2 and D3 fractions is required, the QuestAssureD(TM) 25-OH VIT D, (D2,D3), LC/MS/MS is recommended: order  code [id number] (patients >43yr). . For more information on this test, go to: http://education.[facility].com/faq/FAQ163 (This link is being provided for   informational/educational purposes only.)   Urinalysis, Routine w reflex microscopic     Status: None   Collection Time: 06/19/17 10:08 AM  Result Value Ref Range   Specific Gravity, UA 1.008 1.005 - 1.030   pH, UA 7.0 5.0 - 7.5   Color, UA Yellow Yellow   Appearance Ur Clear Clear   Leukocytes, UA Negative Negative   Protein, UA Negative Negative/Trace   Glucose, UA Negative Negative   Ketones, UA Negative Negative   RBC, UA Negative Negative   Bilirubin, UA Negative Negative   Urobilinogen, Ur 0.2 0.2 - 1.0 mg/dL   Nitrite, UA Negative Negative   Microscopic Examination Comment     Comment: Microscopic not indicated and not performed.   Objective  Body mass index is 28.68 kg/m. Wt Readings from Last 3 Encounters:  07/10/17 155 lb 12.8 oz (70.7 kg)  06/18/17 157 lb (71.2 kg)  05/30/17 156 lb 9.6 oz (71 kg)  Temp Readings from Last 3 Encounters:  07/10/17 98.2 F (36.8 C) (Oral)  06/18/17 98.4 F (36.9 C) (Oral)  05/30/17 98.1 F (36.7 C) (Oral)   BP Readings from Last 3 Encounters:  07/10/17 110/84  06/18/17 (!) 142/92  05/30/17 134/80   Pulse Readings from Last 3 Encounters:  07/10/17 91  06/18/17 99  05/30/17 85    Physical Exam  Constitutional: She is oriented to person, place, and time. Vital signs are normal. She appears well-developed and well-nourished. She is cooperative.  HENT:  Head: Normocephalic and atraumatic.  Mouth/Throat: Oropharynx is clear and moist and mucous membranes are normal.  Eyes: Pupils are equal, round, and reactive to light. Conjunctivae are normal.  Cardiovascular: Normal rate, regular rhythm and normal heart sounds.  Pulmonary/Chest: Effort normal and breath sounds normal.  Abdominal: Soft. Bowel sounds are normal.  Neurological: She is alert and oriented to person, place, and time. Gait normal.  Skin: Skin is warm, dry and intact.  Psychiatric: She has a normal mood and affect. Her speech is normal and behavior is normal.  Judgment and thought content normal. Cognition and memory are normal.  Nursing note and vitals reviewed.   Assessment   1.annual  2. 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 Vit d def  5. Iron def anemia with pica for ice had anemia since younger age denies heavy menses  6. Vaginal itching  7. anxiety Plan  1. Needs to sch mammo  Pap had 06/01/2015 neg neg HPV will repeat in 3 years  Had flu shot  Tdap had  Hep B and MMR immune  Referred mammo pending appt  rec smoking cessation healthy diet and exercise on chantix helping   2. Ice nsaids consider Xray right foot at f/u  3. Never had outbreak monitor  4. On D3 weekly x 6 months 5. On iron 1x per day will try to increase to bid  Repeat CBC and iron studies at f/u  6. Trial of diflucan  7. Start zoloft 25 mg qd  Has still to sch appt with [facility]  Provider: Dr. Olivia Mackie McLean-Scocuzza-[facility]

## 2017-07-10 NOTE — Patient Instructions (Addendum)
F/u in 3 months  Take iron 2x per day  Please sch mammogram    Exercising to Lose Weight Exercising can help you to lose weight. In order to lose weight through exercise, you need to do vigorous-intensity exercise. You can tell that you are exercising with vigorous intensity if you are breathing very hard and fast and cannot hold a conversation while exercising. Moderate-intensity exercise helps to maintain your current weight. You can tell that you are exercising at a moderate level if you have a higher heart rate and faster breathing, but you are still able to hold a conversation. How often should I exercise? Choose an activity that you enjoy and set realistic goals. Your health care provider can help you to make an activity plan that works for you. Exercise regularly as directed by your health care provider. This may include:  Doing resistance training twice each week, such as: ? Push-ups. ? Sit-ups. ? Lifting weights. ? Using resistance bands.  Doing a given intensity of exercise for a given amount of time. Choose from these options: ? 150 minutes of moderate-intensity exercise every week. ? 75 minutes of vigorous-intensity exercise every week. ? A mix of moderate-intensity and vigorous-intensity exercise every week.  Children, pregnant women, people who are out of shape, people who are overweight, and older adults may need to consult a health care provider for individual recommendations. If you have any sort of medical condition, be sure to consult your health care provider before starting a new exercise program. What are some activities that can help me to lose weight?  Walking at a rate of at least 4.5 miles an hour.  Jogging or running at a rate of 5 miles per hour.  Biking at a rate of at least 10 miles per hour.  Lap swimming.  Roller-skating or in-line skating.  Cross-country skiing.  Vigorous competitive sports, such as football, basketball, and soccer.  Jumping  rope.  Aerobic dancing. How can I be more active in my day-to-day activities?  Use the stairs instead of the elevator.  Take a walk during your lunch break.  If you drive, park your car farther away from work or school.  If you take public transportation, get off one stop early and walk the rest of the way.  Make all of your phone calls while standing up and walking around.  Get up, stretch, and walk around every 30 minutes throughout the day. What guidelines should I follow while exercising?  Do not exercise so much that you hurt yourself, feel dizzy, or get very short of breath.  Consult your health care provider prior to starting a new exercise program.  Wear comfortable clothes and shoes with good support.  Drink plenty of water while you exercise to prevent dehydration or heat stroke. Body water is lost during exercise and must be replaced.  Work out until you breathe faster and your heart beats faster. This information is not intended to replace advice given to you by your health care provider. Make sure you discuss any questions you have with your health care provider. Document Released: 02/02/2010 Document Revised: 06/08/2015 Document Reviewed: 06/03/2013 Elsevier Interactive Patient Education  2018 Elsevier Inc.    Plantar Fasciitis Plantar fasciitis is a painful foot condition that affects the heel. It occurs when the band of tissue that connects the toes to the heel bone (plantar fascia) becomes irritated. This can happen after exercising too much or doing other repetitive activities (overuse injury). The pain from plantar  fasciitis can range from mild irritation to severe pain that makes it difficult for you to walk or move. The pain is usually worse in the morning or after you have been sitting or lying down for a while. What are the causes? This condition may be caused by:  Standing for long periods of time.  Wearing shoes that do not fit.  Doing high-impact  activities, including running, aerobics, and ballet.  Being overweight.  Having an abnormal way of walking (gait).  Having tight calf muscles.  Having high arches in your feet.  Starting a new athletic activity.  What are the signs or symptoms? The main symptom of this condition is heel pain. Other symptoms include:  Pain that gets worse after activity or exercise.  Pain that is worse in the morning or after resting.  Pain that goes away after you walk for a few minutes.  How is this diagnosed? This condition may be diagnosed based on your signs and symptoms. Your health care provider will also do a physical exam to check for:  A tender area on the bottom of your foot.  A high arch in your foot.  Pain when you move your foot.  Difficulty moving your foot.  You may also need to have imaging studies to confirm the diagnosis. These can include:  X-rays.  Ultrasound.  MRI.  How is this treated? Treatment for plantar fasciitis depends on the severity of the condition. Your treatment may include:  Rest, ice, and over-the-counter pain medicines to manage your pain.  Exercises to stretch your calves and your plantar fascia.  A splint that holds your foot in a stretched, upward position while you sleep (night splint).  Physical therapy to relieve symptoms and prevent problems in the future.  Cortisone injections to relieve severe pain.  Extracorporeal shock wave therapy (ESWT) to stimulate damaged plantar fascia with electrical impulses. It is often used as a last resort before surgery.  Surgery, if other treatments have not worked after 12 months.  Follow these instructions at home:  Take medicines only as directed by your health care provider.  Avoid activities that cause pain.  Roll the bottom of your foot over a bag of ice or a bottle of cold water. Do this for 20 minutes, 3-4 times a day.  Perform simple stretches as directed by your health care  provider.  Try wearing athletic shoes with air-sole or gel-sole cushions or soft shoe inserts.  Wear a night splint while sleeping, if directed by your health care provider.  Keep all follow-up appointments with your health care provider. How is this prevented?  Do not perform exercises or activities that cause heel pain.  Consider finding low-impact activities if you continue to have problems.  Lose weight if you need to. The best way to prevent plantar fasciitis is to avoid the activities that aggravate your plantar fascia. Contact a health care provider if:  Your symptoms do not go away after treatment with home care measures.  Your pain gets worse.  Your pain affects your ability to move or do your daily activities. This information is not intended to replace advice given to you by your health care provider. Make sure you discuss any questions you have with your health care provider. Document Released: 09/25/2000 Document Revised: 06/05/2015 Document Reviewed: 11/10/2013 Elsevier Interactive Patient Education  2018 Else[employer] Inc.  Heel Spur A heel spur is a bony growth that forms on the bottom of your heel bone (calcaneus). Heel spurs  are common and do not always cause pain. However, heel spurs often cause inflammation in the strong band of tissue that runs underneath the bone of your foot (plantar fascia). When this happens, you may feel pain on the bottom of your foot, near your heel. What are the causes? The cause of heel spurs is not completely understood. They may be caused by pressure on the heel. Or, they may stem from the muscle attachments (tendons) near the spur pulling on the heel. What increases the risk? You may be at risk for a heel spur if you:  Are older than 40.  Are overweight.  Have wear and tear arthritis (osteoarthritis).  Have plantar fascia inflammation.  What are the signs or symptoms? Some people have heel spurs but no symptoms. If you do have  symptoms, they may include:  Pain in the bottom of your heel.  Pain that is worse when you first get out of bed.  Pain that gets worse after walking or standing.  How is this diagnosed? Your health care provider may diagnose a heel spur based on your symptoms and a physical exam. You may also have an X-ray of your foot to check for a bony growth coming from the calcaneus. How is this treated? Treatment aims to relieve the pain from the heel spur. This may include:  Stretching exercises.  Losing weight.  Wearing specific shoes, inserts, or orthotics for comfort and support.  Wearing splints at night to properly position your feet.  Taking over-the-counter medicine to relieve pain.  Being treated with high-intensity sound waves to break up the heel spur (extracorporeal shock wave therapy).  Getting steroid injections in your heel to reduce swelling and ease pain.  Having surgery if your heel spur causes long-term (chronic) pain.  Follow these instructions at home:  Take medicines only as directed by your health care provider.  Ask your health care provider if you should use ice or cold packs on the painful areas of your heel or foot.  Avoid activities that cause you pain until you recover or as directed by your health care provider.  Stretch before exercising or being physically active.  Wear supportive shoes that fit well as directed by your health care provider. You might need to buy new shoes. Wearing old shoes or shoes that do not fit correctly may not provide the support that you need.  Lose weight if your health care provider thinks you should. This can relieve pressure on your foot that may be causing pain and discomfort. Contact a health care provider if:  Your pain continues or gets worse. This information is not intended to replace advice given to you by your health care provider. Make sure you discuss any questions you have with your health care  provider. Document Released: 02/06/2005 Document Revised: 06/08/2015 Document Reviewed: 03/03/2013 Elsevier Interactive Patient Education  2018 [employer].   Iron Deficiency Anemia, Adult Iron-deficiency anemia is when you have a low amount of red blood cells or hemoglobin. This happens because you have too little iron in your body. Hemoglobin carries oxygen to parts of the body. Anemia can cause your body to not get enough oxygen. It may or may not cause symptoms. Follow these instructions at home: Medicines  Take over-the-counter and prescription medicines only as told by your doctor. This includes iron pills (supplements) and vitamins.  If you cannot handle taking iron pills by mouth, ask your doctor about getting iron through: ? A vein (intravenously). ? A  shot (injection) into a muscle.  Take iron pills when your stomach is empty. If you cannot handle this, take them with food.  Do not drink milk or take antacids at the same time as your iron pills.  To prevent trouble pooping (constipation), eat fiber or take medicine (stool softener) as told by your doctor. Eating and drinking  Talk with your doctor before changing the foods you eat. He or she may tell you to eat foods that have a lot of iron, such as: ? Liver. ? Lowfat (lean) beef. ? Breads and cereals that have iron added to them (fortified breads and cereals). ? Eggs. ? Dried fruit. ? Dark green, leafy vegetables.  Drink enough fluid to keep your pee (urine) clear or pale yellow.  Eat fresh fruits and vegetables that are high in vitamin C. They help your body to use iron. Foods with a lot of vitamin C include: ? Oranges. ? Peppers. ? Tomatoes. ? Mangoes. General instructions  Return to your normal activities as told by your doctor. Ask your doctor what activities are safe for you.  Keep yourself clean, and keep things clean around you (your surroundings). Anemia can make you get sick more easily.  Keep all  follow-up visits as told by your doctor. This is important. Contact a doctor if:  You feel sick to your stomach (nauseous).  You throw up (vomit).  You feel weak.  You are sweating for no clear reason.  You have trouble pooping, such as: ? Pooping (having a bowel movement) less than 3 times a week. ? Straining to poop. ? Having poop that is hard, dry, or larger than normal. ? Feeling full or bloated. ? Pain in the lower belly. ? Not feeling better after pooping. Get help right away if:  You pass out (faint). If this happens, do not drive yourself to the hospital. Call your local emergency services (911 in the [place]  You have chest pain.  You have shortness of breath that: ? Is very bad. ? Gets worse with physical activity.  You have a fast heartbeat.  You get light-headed when getting up from sitting or lying down. This information is not intended to replace advice given to you by your health care provider. Make sure you discuss any questions you have with your health care provider. Document Released: 02/02/2010 Document Revised: 09/20/2015 Document Reviewed: 09/20/2015 Elsevier Interactive Patient Education  2017 Elsevier Inc.  Vitamin D Deficiency Vitamin D deficiency is when your body does not have enough vitamin D. Vitamin D is important to your body for many reasons:  It helps the body to absorb two important minerals, called calcium and phosphorus.  It plays a role in bone health.  It may help to prevent some diseases, such as diabetes and multiple sclerosis.  It plays a role in muscle function, including heart function.  You can get vitamin D by:  Eating foods that naturally contain vitamin D.  Eating or drinking milk or other dairy products that have vitamin D added to them.  Taking a vitamin D supplement or a multivitamin supplement that contains vitamin D.  Being in the sun. Your body naturally makes vitamin D when your skin is exposed to sunlight.  Your body changes the sunlight into a form of the vitamin that the body can use.  If vitamin D deficiency is severe, it can cause a condition in which your bones become soft. In adults, this condition is called osteomalacia. In children, this condition  is called rickets. What are the causes? Vitamin D deficiency may be caused by:  Not eating enough foods that contain vitamin D.  Not getting enough sun exposure.  Having certain digestive system diseases that make it difficult for your body to absorb vitamin D. These diseases include Crohn disease, chronic pancreatitis, and cystic fibrosis.  Having a surgery in which a part of the stomach or a part of the small intestine is removed.  Being obese.  Having chronic kidney disease or liver disease.  What increases the risk? This condition is more likely to develop in:  Older people.  People who do not spend much time outdoors.  People who live in a long-term care facility.  People who have had broken bones.  People with weak or thin bones (osteoporosis).  People who have a disease or condition that changes how the body absorbs vitamin D.  People who have dark skin.  People who take certain medicines, such as steroid medicines or certain seizure medicines.  People who are overweight or obese.  What are the signs or symptoms? In mild cases of vitamin D deficiency, there may not be any symptoms. If the condition is severe, symptoms may include:  Bone pain.  Muscle pain.  Falling often.  Broken bones caused by a minor injury.  How is this diagnosed? This condition is usually diagnosed with a blood test. How is this treated? Treatment for this condition may depend on what caused the condition. Treatment options include:  Taking vitamin D supplements.  Taking a calcium supplement. Your health care provider will suggest what dose is best for you.  Follow these instructions at home:  Take medicines and supplements only  as told by your health care provider.  Eat foods that contain vitamin D. Choices include: ? Fortified dairy products, cereals, or juices. Fortified means that vitamin D has been added to the food. Check the label on the package to be sure. ? Fatty fish, such as salmon or trout. ? Eggs. ? Oysters.  Do not use a tanning bed.  Maintain a healthy weight. Lose weight, if needed.  Keep all follow-up visits as told by your health care provider. This is important. Contact a health care provider if:  Your symptoms do not go away.  You feel like throwing up (nausea) or you throw up (vomit).  You have fewer bowel movements than usual or it is difficult for you to have a bowel movement (constipation). This information is not intended to replace advice given to you by your health care provider. Make sure you discuss any questions you have with your health care provider. Document Released: 03/25/2011 Document Revised: 06/14/2015 Document Reviewed: 05/18/2014 Elsevier Interactive Patient Education  2018 [employer]

## 2017-07-23 ENCOUNTER — Ambulatory Visit
Admission: RE | Admit: 2017-07-23 | Discharge: 2017-07-23 | Disposition: A | Discharge status: Home / Self Care | Payer: 59 | Source: Ambulatory Visit | Attending: Internal Medicine | Admitting: Internal Medicine

## 2017-07-23 DIAGNOSIS — Z1231 Encounter for screening mammogram for malignant neoplasm of breast: Secondary | ICD-10-CM | POA: Diagnosis not present

## 2017-09-02 ENCOUNTER — Encounter: Payer: Self-pay | Admitting: Internal Medicine

## 2017-09-02 NOTE — Telephone Encounter (Signed)
I would suggest she try to take zoloft in AM to see if this makes a difference before adding a med to help sleep.

## 2017-09-12 ENCOUNTER — Encounter

## 2017-09-12 ENCOUNTER — Ambulatory Visit: Payer: Self-pay | Admitting: Psychology

## 2017-10-10 ENCOUNTER — Ambulatory Visit: Payer: Self-pay | Admitting: Internal Medicine

## 2017-10-10 DIAGNOSIS — Z0289 Encounter for other administrative examinations: Secondary | ICD-10-CM

## 2017-10-16 ENCOUNTER — Ambulatory Visit (INDEPENDENT_AMBULATORY_CARE_PROVIDER_SITE_OTHER): Payer: 59 | Admitting: Internal Medicine

## 2017-10-16 ENCOUNTER — Encounter: Payer: Self-pay | Admitting: Internal Medicine

## 2017-10-16 VITALS — BP 122/80 | HR 81 | Temp 98.0°F | Ht 61.8 in | Wt 164.2 lb

## 2017-10-16 DIAGNOSIS — D509 Iron deficiency anemia, unspecified: Secondary | ICD-10-CM

## 2017-10-16 DIAGNOSIS — E785 Hyperlipidemia, unspecified: Secondary | ICD-10-CM | POA: Diagnosis not present

## 2017-10-16 DIAGNOSIS — G4700 Insomnia, unspecified: Secondary | ICD-10-CM

## 2017-10-16 DIAGNOSIS — F419 Anxiety disorder, unspecified: Secondary | ICD-10-CM

## 2017-10-16 DIAGNOSIS — E559 Vitamin D deficiency, unspecified: Secondary | ICD-10-CM | POA: Diagnosis not present

## 2017-10-16 DIAGNOSIS — F329 Major depressive disorder, single episode, unspecified: Secondary | ICD-10-CM

## 2017-10-16 DIAGNOSIS — F411 Generalized anxiety disorder: Secondary | ICD-10-CM

## 2017-10-16 MED ORDER — TEMAZEPAM 15 MG PO CAPS: 15.0000 mg | ORAL_CAPSULE | Freq: Every evening | ORAL | 2 refills | Status: DC | PRN

## 2017-10-16 MED ORDER — SERTRALINE HCL 50 MG PO TABS: 50.0000 mg | ORAL_TABLET | Freq: Every day | ORAL | 3 refills | Status: DC

## 2017-10-16 NOTE — Progress Notes (Signed)
Chief Complaint  Patient presents with  . Follow-up   1. Anxiety/depression/insomnia not controlled on zoloft 25 mg qd mood variable cyring and snapping did not see therapy yet due to 41 y.o son seeing therapist. She is waking up at 3-4 am despite turning tv off tried melatonin w/o help  2. Tobacco abuse quit smoking 07/2017  3. Vitamin D def taking weekly D3   Review of Systems  Constitutional: Negative for weight loss.  HENT: Negative for hearing loss.   Eyes: Negative for blurred vision.  Respiratory: Negative for shortness of breath.   Cardiovascular: Negative for chest pain.  Gastrointestinal: Negative for abdominal pain.  Skin: Negative for rash.  Psychiatric/Behavioral: Positive for depression. The patient is nervous/anxious and has insomnia.    Past Medical History:  Diagnosis Date  . Anemia   . Anxiety   . Depression   . Herpes    1/2 +  . History of chicken pox   . History of urinary tract infection    Past Surgical History:  Procedure Laterality Date  . DILATION AND CURETTAGE OF UTERUS  2001   s/p miscarriage  . ENDOMETRIAL ABLATION  2006   hx of abnormal pap  . TONSILLECTOMY AND ADENOIDECTOMY  1994  . TUBAL LIGATION  2006   Family History  Problem Relation Age of Onset  . Diabetes Mother   . Hypertension Mother   . Hypertension Father   . Heart disease Father        CABG  . Cancer Maternal Grandfather        Lung cancer - non smoker  . Breast cancer Paternal Grandmother   . Bipolar disorder Sister   . Stroke Maternal Grandmother   . Bipolar disorder Cousin    Social History   Socioeconomic History  . Marital status: Married    Spouse name: Not on file  . Number of children: 1  . Years of education: 58  . Highest education level: Not on file  Occupational History  . Occupation: [employer]    Comment: [place]  . Occupation: Associate Professor    Comment: [employer]  . Financial resource strain: Not on file  . Food  insecurity:    Worry: Not on file    Inability: Not on file  . Transportation needs:    Medical: Not on file    Non-medical: Not on file  Tobacco Use  . Smoking status: Light Tobacco Smoker    Packs/day: 0.25    Years: 18.00    Pack years: 4.50  . Smokeless tobacco: Never Used  Substance and Sexual Activity  . Alcohol use: Yes    Alcohol/week: 0.0 standard drinks    Comment: Occassional glass of wine  . Drug use: No  . Sexual activity: Yes    Partners: Male    Birth control/protection: Surgical    Comment: Husband  Lifestyle  . Physical activity:    Days per week: Not on file    Minutes per session: Not on file  . Stress: Not on file  Relationships  . Social connections:    Talks on phone: Not on file    Gets together: Not on file    Attends religious service: Not on file    Active member of club or organization: Not on file    Attends meetings of clubs or organizations: Not on file    Relationship status: Not on file  . Intimate partner violence:    Fear of  current or ex partner: Not on file    Emotionally abused: Not on file    Physically abused: Not on file    Forced sexual activity: Not on file  Other Topics Concern  . Not on file  Social History Narrative   [name] grew up in [place], [place]. She lives in [place] with her Husband and their son. Her husband has 3 daughters that live with them as well. She has 67 y.o son as of 06/2017. 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 She enjoys spending time with the family, outdoor activities, shopping.   Married x 16 years       Exercise - not currently   Caffeine - 1 cup daily   Current Meds  Medication Sig  . Cholecalciferol 50000 units capsule Take 1 capsule (50,000 Units total) by mouth once a week.  . cyclobenzaprine (FLEXERIL) 5 MG tablet Take 1 tablet (5 mg total) by mouth at bedtime as needed for muscle spasms.  Marland Kitchen  sertraline (ZOLOFT) 50 MG tablet Take 1 tablet (50 mg total) by mouth daily.  . varenicline (CHANTIX STARTING MONTH PAK) 0.5 MG X 11 & 1 MG X 42 tablet Take one 0.5 mg tablet by mouth once daily for 3 days, then increase to one 0.5 mg tablet twice daily for 4 days, then increase to one 1 mg tablet twice daily.  . [DISCONTINUED] sertraline (ZOLOFT) 25 MG tablet Take 1 tablet (25 mg total) by mouth daily.   No Known Allergies No results found for this or any previous visit (from the past 2160 hour(s)). Objective  Body mass index is 30.23 kg/m. Wt Readings from Last 3 Encounters:  10/16/17 164 lb 3.2 oz (74.5 kg)  07/10/17 155 lb 12.8 oz (70.7 kg)  06/18/17 157 lb (71.2 kg)   Temp Readings from Last 3 Encounters:  10/16/17 98 F (36.7 C) (Oral)  07/10/17 98.2 F (36.8 C) (Oral)  06/18/17 98.4 F (36.9 C) (Oral)   BP Readings from Last 3 Encounters:  10/16/17 122/80  07/10/17 110/84  06/18/17 (!) 142/92   Pulse Readings from Last 3 Encounters:  10/16/17 81  07/10/17 91  06/18/17 99    Physical Exam  Constitutional: She is oriented to person, place, and time. Vital signs are normal. She appears well-developed and well-nourished. She is cooperative.  HENT:  Head: Normocephalic and atraumatic.  Mouth/Throat: Oropharynx is clear and moist and mucous membranes are normal.  Eyes: Pupils are equal, round, and reactive to light. Conjunctivae are normal.  Cardiovascular: Normal rate, regular rhythm and normal heart sounds.  Pulmonary/Chest: Effort normal and breath sounds normal.  Neurological: She is alert and oriented to person, place, and time. Gait normal.  Skin: Skin is warm, dry and intact.  Psychiatric: She has a normal mood and affect. Her speech is normal and behavior is normal. Judgment and thought content normal. Cognition and memory are normal.  Nursing note and vitals reviewed.   Assessment   1. Anxiety/depression/insomnia 2. Tobacco abuse  3. Vit D def  4. HM Plan    1. Increase zoloft 50 mg qd trial of restoril 15 mg qhs Given list of therapist   2. Quit 07/2017 congratulated  3. Weekly D3 x 6 months  Recheck labs 12/19/2017  4.  Flu shot had 09/24/17  Tdap had  MMR immune  Hep B immune   Pap at f/u last 06/01/15  neg neg hpv  mammo 07/23/17 negative  rec smoking cessation    Provider: Dr. Olivia Mackie McLean-Scocuzza-[facility]

## 2017-10-16 NOTE — Progress Notes (Signed)
Pre visit review using our clinic review tool, if applicable. No additional management support is needed unless otherwise documented below in the visit note. 

## 2017-10-16 NOTE — Addendum Note (Signed)
Addended by: Warden Fillers on: 10/16/2017 09:13 AM   Modules accepted: Orders

## 2017-10-16 NOTE — Patient Instructions (Addendum)
Cut restoril in 1/2 if too much   [facility] in [place] [phone] [facility] [place] [phone]    Restoril/Temazepam tablets or capsules What is this medicine? TEMAZEPAM (te MAZ e pam) is a benzodiazepine. It is used to help you to fall asleep and sleep through the night. This medicine may be used for other purposes; ask your health care provider or pharmacist if you have questions. COMMON BRAND NAME(S): Restoril What should I tell my health care provider before I take this medicine? They need to know if you have any of these conditions: -an alcohol or drug abuse problem -bipolar disorder, depression, psychosis or other mental health condition -kidney disease -liver disease -lung or breathing disease -myasthenia gravis -Parkinson's disease -porphyria -seizures or a history of seizures -suicidal thoughts -an unusual or allergic reaction to temazepam, other benzodiazepines, other medicines, foods, dyes, or preservatives -pregnant or trying to get pregnant -breast-feeding How should I use this medicine? Take this medicine by mouth. It is only for use at bedtime. Follow the directions on the prescription label. Swallow the tablets or capsules with a drink of water. If it upsets your stomach, take it with food or milk. Do not take your medicine more often than directed. Do not stop taking except on the advice of your doctor or health care professional. A special MedGuide will be given to you by the pharmacist with each prescription and refill. Be sure to read this information carefully each time. Talk to your pediatrician regarding the use of this medicine in children. Special care may be needed. Overdosage: If you think you have taken too much of this medicine contact a poison control center or emergency room at once. NOTE: This medicine is only for you. Do not share this medicine with others. What if I miss a dose? If you miss a dose, take it as soon as you can. If it is  almost time for your next dose, take only that dose. Do not take double or extra doses. What may interact with this medicine? Do not take this medicine with any of the following medications: -narcotic medicines for cough -sodium oxybate This medicine may also interact with the following medications: -alcohol -antihistamines for allergy, cough and cold -certain medicines for anxiety or sleep -certain medicines for depression, like amitriptyline, fluoxetine, sertraline -certain medicines for seizures like phenobarbital, primidone -general anesthetics like lidocaine, pramoxine, tetracaine -medicines that relax muscles for surgery -narcotic medicines for pain -phenothiazines like chlorpromazine, mesoridazine, prochlorperazine, thioridazine This list may not describe all possible interactions. Give your health care provider a list of all the medicines, herbs, non-prescription drugs, or dietary supplements you use. Also tell them if you smoke, drink alcohol, or use illegal drugs. Some items may interact with your medicine. What should I watch for while using this medicine? Tell your doctor or health care professional if your symptoms do not start to get better or if they get worse. Do not stop taking except on your doctor's advice. You may develop a severe reaction. Your doctor will tell you how much medicine to take. You may get drowsy or dizzy. Do not drive, use machinery, or do anything that needs mental alertness until you know how this medicine affects you. Do not stand or sit up quickly, especially if you are an older patient. This reduces the risk of dizzy or fainting spells. Alcohol may interfere with the effect of this medicine. Avoid alcoholic drinks. If you are taking another medicine that also causes drowsiness, you  may have more side effects. Give your health care provider a list of all medicines you use. Your doctor will tell you how much medicine to take. Do not take more medicine than  directed. Call emergency for help if you have problems breathing or unusual sleepiness. After taking this medicine for sleep, you may get up out of bed while not being fully awake and do an activity that you do not know you are doing. The next morning, you may have no memory of the event. Activities such as driving a car ("sleep-driving"), making and eating food, talking on the phone, sexual activity, and sleep-walking have been reported. Call your doctor right away if you find out you have done any of these activities. Do not take this medicine if you have used alcohol that evening or before bed or taken another medicine for sleep since your risk of doing these sleep-related activities will be increased. Do not take this medicine unless you are able to stay in bed for a full night (7 to 8 hour) before you must be active again. You may have a decrease in mental alertness the day after use, even if you feel that you are fully awake. Tell your doctor if you will need to perform activities requiring full alertness, such as driving, the next day. Do not stand or sit up quickly after taking this medicine, especially if you are an older patient. This reduces the risk of dizzy or fainting spells. If you or your family notice any changes in your behavior, such as new or worsening depression, thoughts of harming yourself, anxiety, other unusual or disturbing thoughts, or memory loss, call your doctor right away. After you stop taking this medicine, you may have trouble falling asleep. This is called rebound insomnia. This problem usually goes away on its own after 1 or 2 nights. Women should inform their doctor if they wish to become pregnant or think they might be pregnant. There is a potential for serious side effects to an unborn child. Talk to your health care professional or pharmacist for more information. What side effects may I notice from receiving this medicine? Side effects that you should report to your  doctor or health care professional as soon as possible: -allergic reactions like skin rash, itching or hives, swelling of the face, lips, or tongue -breathing problems -confusion -loss of balance or coordination -signs and symptoms of low blood pressure like dizziness; feeling faint or lightheaded, falls; unusually weak or tired -suicidal thoughts or other mood changes -unusual activities while asleep like driving, eating, making phone calls Side effects that usually do not require medical attention (report to your doctor or health care professional if they continue or are bothersome): -diarrhea -dizziness -nausea, vomiting -tiredness This list may not describe all possible side effects. Call your doctor for medical advice about side effects. You may report side effects to FDA at 1-800-FDA-1088. Where should I keep my medicine? Keep out of the reach of children. This medicine can be abused. Keep your medicine in a safe place to protect it from theft. Do not share this medicine with anyone. Selling or giving away this medicine is dangerous and against the law. This medicine may cause accidental overdose and death if taken by other adults, children, or pets. Mix any unused medicine with a substance like cat litter or coffee grounds. Then throw the medicine away in a sealed container like a sealed bag or a coffee can with a lid. Do not use the medicine  after the expiration date. Store at room temperature below 30 degrees C (86 degrees F). Protect from light. Keep container tightly closed. NOTE: This sheet is a summary. It may not cover all possible information. If you have questions about this medicine, talk to your doctor, pharmacist, or health care provider.  2018 Elsevier/Gold Standard (2014-09-29 23:44:07)  Insomnia Insomnia is a sleep disorder that makes it difficult to fall asleep or to stay asleep. Insomnia can cause tiredness (fatigue), low energy, difficulty concentrating, mood swings,  and poor performance at work or school. There are three different ways to classify insomnia:  Difficulty falling asleep.  Difficulty staying asleep.  Waking up too early in the morning.  Any type of insomnia can be long-term (chronic) or short-term (acute). Both are common. Short-term insomnia usually lasts for three months or less. Chronic insomnia occurs at least three times a week for longer than three months. What are the causes? Insomnia may be caused by another condition, situation, or substance, such as:  Anxiety.  Certain medicines.  Gastroesophageal reflux disease (GERD) or other gastrointestinal conditions.  Asthma or other breathing conditions.  Restless legs syndrome, sleep apnea, or other sleep disorders.  Chronic pain.  Menopause. This may include hot flashes.  Stroke.  Abuse of alcohol, tobacco, or illegal drugs.  Depression.  Caffeine.  Neurological disorders, such as Alzheimer disease.  An overactive thyroid (hyperthyroidism).  The cause of insomnia may not be known. What increases the risk? Risk factors for insomnia include:  Gender. Women are more commonly affected than men.  Age. Insomnia is more common as you get older.  Stress. This may involve your professional or personal life.  Income. Insomnia is more common in people with lower income.  Lack of exercise.  Irregular work schedule or night shifts.  Traveling between different time zones.  What are the signs or symptoms? If you have insomnia, trouble falling asleep or trouble staying asleep is the main symptom. This may lead to other symptoms, such as:  Feeling fatigued.  Feeling nervous about going to sleep.  Not feeling rested in the morning.  Having trouble concentrating.  Feeling irritable, anxious, or depressed.  How is this treated? Treatment for insomnia depends on the cause. If your insomnia is caused by an underlying condition, treatment will focus on addressing  the condition. Treatment may also include:  Medicines to help you sleep.  Counseling or therapy.  Lifestyle adjustments.  Follow these instructions at home:  Take medicines only as directed by your health care provider.  Keep regular sleeping and waking hours. Avoid naps.  Keep a sleep diary to help you and your health care provider figure out what could be causing your insomnia. Include: ? When you sleep. ? When you wake up during the night. ? How well you sleep. ? How rested you feel the next day. ? Any side effects of medicines you are taking. ? What you eat and drink.  Make your bedroom a comfortable place where it is easy to fall asleep: ? Put up shades or special blackout curtains to block light from outside. ? Use a white noise machine to block noise. ? Keep the temperature cool.  Exercise regularly as directed by your health care provider. Avoid exercising right before bedtime.  Use relaxation techniques to manage stress. Ask your health care provider to suggest some techniques that may work well for you. These may include: ? Breathing exercises. ? Routines to release muscle tension. ? Visualizing peaceful scenes.  Cut  back on alcohol, caffeinated beverages, and cigarettes, especially close to bedtime. These can disrupt your sleep.  Do not overeat or eat spicy foods right before bedtime. This can lead to digestive discomfort that can make it hard for you to sleep.  Limit screen use before bedtime. This includes: ? Watching TV. ? Using your smartphone, tablet, and computer.  Stick to a routine. This can help you fall asleep faster. Try to do a quiet activity, brush your teeth, and go to bed at the same time each night.  Get out of bed if you are still awake after 15 minutes of trying to sleep. Keep the lights down, but try reading or doing a quiet activity. When you feel sleepy, go back to bed.  Make sure that you drive carefully. Avoid driving if you feel very  sleepy.  Keep all follow-up appointments as directed by your health care provider. This is important. Contact a health care provider if:  You are tired throughout the day or have trouble in your daily routine due to sleepiness.  You continue to have sleep problems or your sleep problems get worse. Get help right away if:  You have serious thoughts about hurting yourself or someone else. This information is not intended to replace advice given to you by your health care provider. Make sure you discuss any questions you have with your health care provider. Document Released: 12/29/1999 Document Revised: 06/02/2015 Document Reviewed: 10/01/2013 Elsevier Interactive Patient Education  2018 [employer].   Cholesterol Cholesterol is a white, waxy, fat-like substance that is needed by the human body in small amounts. The liver makes all the cholesterol we need. Cholesterol is carried from the liver by the blood through the blood vessels. Deposits of cholesterol (plaques) may build up on blood vessel (artery) walls. Plaques make the arteries narrower and stiffer. Cholesterol plaques increase the risk for heart attack and stroke. You cannot feel your cholesterol level even if it is very high. The only way to know that it is high is to have a blood test. Once you know your cholesterol levels, you should keep a record of the test results. Work with your health care provider to keep your levels in the desired range. What do the results mean?  Total cholesterol is a rough measure of all the cholesterol in your blood.  LDL (low-density lipoprotein) is the "bad" cholesterol. This is the type that causes plaque to build up on the artery walls. You want this level to be low.  HDL (high-density lipoprotein) is the "good" cholesterol because it cleans the arteries and carries the LDL away. You want this level to be high.  Triglycerides are fat that the body can either burn for energy or store. High levels  are closely linked to heart disease. What are the desired levels of cholesterol?  Total cholesterol below 200.  LDL below 100 for people who are at risk, below 70 for people at very high risk.  HDL above 40 is good. A level of 60 or higher is considered to be protective against heart disease.  Triglycerides below 150. How can I lower my cholesterol? Diet Follow your diet program as told by your health care provider.  Choose fish or white meat chicken and [place], roasted or baked. Limit fatty cuts of red meat, fried foods, and processed meats, such as sausage and lunch meats.  Eat lots of fresh fruits and vegetables.  Choose whole grains, beans, pasta, potatoes, and cereals.  Choose olive oil,  corn oil, or canola oil, and use only small amounts.  Avoid butter, mayonnaise, shortening, or palm kernel oils.  Avoid foods with trans fats.  Drink skim or nonfat milk and eat low-fat or nonfat yogurt and cheeses. Avoid whole milk, cream, ice cream, egg yolks, and full-fat cheeses.  Healthier desserts include angel food cake, ginger snaps, animal crackers, hard candy, popsicles, and low-fat or nonfat frozen yogurt. Avoid pastries, cakes, pies, and cookies.  Exercise  Follow your exercise program as told by your health care provider. A regular program: ? Helps to decrease LDL and raise HDL. ? Helps with weight control.  Do things that increase your activity level, such as gardening, walking, and taking the stairs.  Ask your health care provider about ways that you can be more active in your daily life.  Medicine  Take over-the-counter and prescription medicines only as told by your health care provider. ? Medicine may be prescribed by your health care provider to help lower cholesterol and decrease the risk for heart disease. This is usually done if diet and exercise have failed to bring down cholesterol levels. ? If you have several risk factors, you may need medicine even if your  levels are normal.  This information is not intended to replace advice given to you by your health care provider. Make sure you discuss any questions you have with your health care provider. Document Released: 09/25/2000 Document Revised: 07/29/2015 Document Reviewed: 07/01/2015 Elsevier Interactive Patient Education  [employer].

## 2017-12-19 ENCOUNTER — Other Ambulatory Visit: Payer: Self-pay

## 2018-01-09 ENCOUNTER — Encounter: Payer: 59 | Admitting: Certified Nurse Midwife

## 2018-01-09 ENCOUNTER — Encounter: Payer: Self-pay | Admitting: Certified Nurse Midwife

## 2018-01-13 ENCOUNTER — Other Ambulatory Visit (HOSPITAL_COMMUNITY)
Admission: RE | Admit: 2018-01-13 | Discharge: 2018-01-13 | Disposition: A | Discharge status: Home / Self Care | Payer: 59 | Source: Ambulatory Visit | Attending: Certified Nurse Midwife | Admitting: Certified Nurse Midwife

## 2018-01-13 ENCOUNTER — Ambulatory Visit (INDEPENDENT_AMBULATORY_CARE_PROVIDER_SITE_OTHER): Payer: 59 | Admitting: Certified Nurse Midwife

## 2018-01-13 ENCOUNTER — Encounter: Payer: Self-pay | Admitting: Certified Nurse Midwife

## 2018-01-13 VITALS — BP 135/102 | HR 66 | Ht 61.0 in | Wt 167.4 lb

## 2018-01-13 DIAGNOSIS — F419 Anxiety disorder, unspecified: Secondary | ICD-10-CM

## 2018-01-13 DIAGNOSIS — F329 Major depressive disorder, single episode, unspecified: Secondary | ICD-10-CM

## 2018-01-13 DIAGNOSIS — N939 Abnormal uterine and vaginal bleeding, unspecified: Secondary | ICD-10-CM

## 2018-01-13 NOTE — Patient Instructions (Signed)
Abnormal Uterine Bleeding  Abnormal uterine bleeding is unusual bleeding from the uterus. It includes:   Bleeding or spotting between periods.   Bleeding after sex.   Bleeding that is heavier than normal.   Periods that last longer than usual.   Bleeding after menopause.  Abnormal uterine bleeding can affect women at various stages in life, including teenagers, women in their reproductive years, pregnant women, and women who have reached menopause. Common causes of abnormal uterine bleeding include:   Pregnancy.   Growths of tissue (polyps).   A noncancerous tumor in the uterus (fibroid).   Infection.   Cancer.   Hormonal imbalances.  Any type of abnormal bleeding should be evaluated by a health care provider. Many cases are minor and simple to treat, while others are more serious. Treatment will depend on the cause of the bleeding.  Follow these instructions at home:   Monitor your condition for any changes.   Do not use tampons, douche, or have sex if told by your health care provider.   Change your pads often.   Get regular exams that include pelvic exams and cervical cancer screening.   Keep all follow-up visits as told by your health care provider. This is important.  Contact a health care provider if:   Your bleeding lasts for more than one week.   You feel dizzy at times.   You feel nauseous or you vomit.  Get help right away if:   You pass out.   Your bleeding soaks through a pad every hour.   You have abdominal pain.   You have a fever.   You become sweaty or weak.   You pass large blood clots from your vagina.  Summary   Abnormal uterine bleeding is unusual bleeding from the uterus.   Any type of abnormal bleeding should be evaluated by a health care provider. Many cases are minor and simple to treat, while others are more serious.   Treatment will depend on the cause of the bleeding.  This information is not intended to replace advice given to you by your health care provider.  Make sure you discuss any questions you have with your health care provider.  Document Released: 12/31/2004 Document Revised: 02/02/2016 Document Reviewed: 02/02/2016  Elsevier Interactive Patient Education  2019 [employer] Inc[employer]

## 2018-01-13 NOTE — Progress Notes (Signed)
Patient present today due to long menses, started 12/8 and flow still continues, flow has been moderate to light with clots. Patient stopped Zoloft 2 weeks ago and c/o feeling very anxious, having difficulty concentrating.

## 2018-01-14 LAB — CBC
HEMOGLOBIN: 12.8 g/dL (ref 11.1–15.9)
Hematocrit: 37.5 % (ref 34.0–46.6)
MCH: 26.6 pg (ref 26.6–33.0)
MCHC: 34.1 g/dL (ref 31.5–35.7)
MCV: 78 fL — ABNORMAL LOW (ref 79–97)
Platelets: 376 10*3/uL (ref 150–450)
RBC: 4.82 x10E6/uL (ref 3.77–5.28)
RDW: 14.4 % (ref 12.3–15.4)
WBC: 4.3 10*3/uL (ref 3.4–10.8)

## 2018-01-14 LAB — FSH/LH
FSH: 8.1 m[IU]/mL
LH: 7.6 m[IU]/mL

## 2018-01-14 LAB — TSH: TSH: 1.93 u[IU]/mL (ref 0.450–4.500)

## 2018-01-14 LAB — ESTRADIOL: ESTRADIOL: 50.6 pg/mL

## 2018-01-15 ENCOUNTER — Ambulatory Visit: Payer: Self-pay | Admitting: Internal Medicine

## 2018-01-16 LAB — CERVICOVAGINAL ANCILLARY ONLY
BACTERIAL VAGINITIS: POSITIVE — AB
Candida vaginitis: NEGATIVE
Chlamydia: NEGATIVE
Neisseria Gonorrhea: NEGATIVE
TRICH (WINDOWPATH): NEGATIVE

## 2018-01-23 ENCOUNTER — Other Ambulatory Visit: Payer: Self-pay

## 2018-01-23 ENCOUNTER — Encounter: Payer: Self-pay | Admitting: Certified Nurse Midwife

## 2018-01-23 MED ORDER — METRONIDAZOLE 0.75 % VA GEL: 1.0000 | Freq: Every day | VAGINAL | 0 refills | Status: DC

## 2018-01-25 NOTE — Progress Notes (Addendum)
GYN ENCOUNTER NOTE  Subjective:       Jacqueline Padilla is a 43 y.o. G56P1011 female is here for gynecologic evaluation of the following issues:  1. Abnormal uterine bleeding 2. Anxiety and depression  Reports prolonged menses that started on 12/21/2017. Flow continues and ranges from moderate to light with clots.  Discontinued Zoloft approximately two (2) weeks ago after promotion at work. Notes feeling very anxious and having difficulty concentrating.   No SI/HI. Denies difficulty breathing or respiratory distress, chest pain, abdominal pain, dysuria, and leg pain or swelling.    Gynecologic History  Patient's last menstrual period was 12/21/2017 (exact date).  Period Cycle (Days): 28 Period Duration (Days): 5 Period Pattern: Regular Menstrual Flow: Moderate Menstrual Control: Tampon, Thin pad Dysmenorrhea: None  Contraception: tubal ligation   Last Pap: 05/2015. Results were: Negative/Negative  Last mammogram: 07/2017. Results were: normal  Obstetric History  OB History  Gravida Para Term Preterm AB Living  2 1 1   1 1   SAB TAB Ectopic Multiple Live Births  1       1    # Outcome Date GA Lbr Len/2nd Weight Sex Delivery Anes PTL Lv  2 Term 2003   7 lb 2.6 oz (3.248 kg) M Vag-Spont   LIV  1 SAB 2001            Past Medical History:  Diagnosis Date  . Anemia   . Anxiety   . Depression   . Herpes    1/2 +  . History of chicken pox   . History of urinary tract infection     Past Surgical History:  Procedure Laterality Date  . DILATION AND CURETTAGE OF UTERUS  2001   s/p miscarriage  . ENDOMETRIAL ABLATION  2006   hx of abnormal pap  . TONSILLECTOMY AND ADENOIDECTOMY  1994  . TUBAL LIGATION  2006    Current Outpatient Medications on File Prior to Visit  Medication Sig Dispense Refill  . Biotin 1 MG CAPS Take by mouth.    . cyclobenzaprine (FLEXERIL) 5 MG tablet Take 1 tablet (5 mg total) by mouth at bedtime as needed for muscle spasms. 30 tablet 1  .  ferrous sulfate 325 (65 FE) MG EC tablet Take 325 mg by mouth once a week.    . temazepam (RESTORIL) 15 MG capsule Take 1 capsule (15 mg total) by mouth at bedtime as needed for sleep. 1 hour before bed 30 capsule 2  . sertraline (ZOLOFT) 50 MG tablet Take 1 tablet (50 mg total) by mouth daily. (Patient not taking: Reported on 01/13/2018) 90 tablet 3   No current facility-administered medications on file prior to visit.     No Known Allergies  Social History   Socioeconomic History  . Marital status: Married    Spouse name: Not on file  . Number of children: 1  . Years of education: 8  . Highest education level: Not on file  Occupational History  . Occupation: [place] [employer]    Comment: [employer]  . Occupation: Advertising copywriter    Comment: [employer]  Social Needs  . Financial resource strain: Not on file  . Food insecurity:    Worry: Not on file    Inability: Not on file  . Transportation needs:    Medical: Not on file    Non-medical: Not on file  Tobacco Use  . Smoking status: Former Smoker    Packs/day: 0.25    Years: 18.00  Pack years: 4.50    Start date: 07/14/2017  . Smokeless tobacco: Never Used  Substance and Sexual Activity  . Alcohol use: Yes    Alcohol/week: 0.0 standard drinks    Comment: Occassional glass of wine  . Drug use: No  . Sexual activity: Yes    Partners: Male    Birth control/protection: Surgical  Lifestyle  . Physical activity:    Days per week: Not on file    Minutes per session: Not on file  . Stress: Not on file  Relationships  . Social connections:    Talks on phone: Not on file    Gets together: Not on file    Attends religious service: Not on file    Active member of club or organization: Not on file    Attends meetings of clubs or organizations: Not on file    Relationship status: Not on file  . Intimate partner violence:    Fear of current or ex partner: Not on file    Emotionally abused: Not on file     Physically abused: Not on file    Forced sexual activity: Not on file  Other Topics Concern  . Not on file  Social History Narrative   [place] in [place] her Husband and their son. Her husband has 3 daughters that live with them as well. She has 42 y.o son as of 06/2017. 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 She also works in an Adult [employer] for [employer]. She enjoys spending time with the family, outdoor activities, shopping.   Married x 16 years       Exercise - not currently   Caffeine - 1 cup daily    Family History  Problem Relation Age of Onset  . Diabetes Mother   . Hypertension Mother   . Hypertension Father   . Heart disease Father        CABG  . Cancer Maternal Grandfather        Lung cancer - non smoker  . Breast cancer Paternal Grandmother   . Bipolar disorder Sister   . Stroke Maternal Grandmother   . Bipolar disorder Cousin   . Ovarian cancer Neg Hx   . Colon cancer Neg Hx     The following portions of the patient's history were reviewed and updated as appropriate: allergies, current medications, past family history, past medical history, past social history, past surgical history and problem list.  Review of Systems  ROS negative except as noted above. Information obtained from patient.   Objective:   BP (!) 135/102   Pulse 66   Ht 5\' 1"  (1.549 m)   Wt 167 lb 6.4 oz (75.9 kg)   LMP 12/21/2017 (Exact Date)   BMI 31.63 kg/m    CONSTITUTIONAL: Well-developed, well-nourished female in no acute distress.   PHYSICAL EXAM: Not indicated.   Recent Results (from the past 2160 hour(s))  Cervicovaginal ancillary only     Status: Abnormal   Collection Time: 01/13/18 12:00 AM  Result Value Ref Range   Bacterial vaginitis **POSITIVE for Gardnerella vaginalis** (A)     Comment: Normal Reference Range - Negative   Candida vaginitis Negative for Candida species      Comment: Normal Reference Range - Negative   Chlamydia Negative     Comment: Normal Reference Range - Negative   Neisseria gonorrhea Negative     Comment: Normal  Reference Range - Negative   Trichomonas Negative     Comment: Normal Reference Range - Negative  CBC     Status: Abnormal   Collection Time: 01/13/18  3:57 PM  Result Value Ref Range   WBC 4.3 3.4 - 10.8 x10E3/uL   RBC 4.82 3.77 - 5.28 x10E6/uL   Hemoglobin 12.8 11.1 - 15.9 g/dL   Hematocrit 40.937.5 81.134.0 - 46.6 %   MCV 78 (L) 79 - 97 fL   MCH 26.6 26.6 - 33.0 pg   MCHC 34.1 31.5 - 35.7 g/dL   RDW 91.414.4 78.212.3 - 95.615.4 %    Comment: **Effective January 19, 2018, the RDW pediatric reference**   interval will be removed and the adult reference interval   will be changing to:                             Female 11.7 - 15.4                                                      Female 11.6 - 15.4    Platelets 376 150 - 450 x10E3/uL  Estradiol     Status: None   Collection Time: 01/13/18  3:57 PM  Result Value Ref Range   Estradiol 50.6 pg/mL    Comment:                     Adult Female:                       Follicular phase   12.5 -   166.0                       Ovulation phase    85.8 -   498.0                       Luteal phase       43.8 -   211.0                       Postmenopausal     <6.0 -    54.7                     Pregnancy                       1st trimester     215.0 - >4300.0                     Girls (1-10 years)    6.0 -    27.0 Roche ECLIA methodology   FSH/LH     Status: None   Collection Time: 01/13/18  3:57 PM  Result Value Ref Range   LH 7.6 mIU/mL    Comment:                     Adult Female:                       Follicular phase      2.4 -  12.6  Ovulation phase      14.0 -  95.6                       Luteal phase          1.0 -  11.4                       Postmenopausal        7.7 -  58.5    FSH 8.1 mIU/mL    Comment:                     Adult Female:                        Follicular phase      3.5 -  12.5                       Ovulation phase       4.7 -  21.5                       Luteal phase          1.7 -   7.7                       Postmenopausal       25.8 - 134.8   TSH     Status: None   Collection Time: 01/13/18  3:57 PM  Result Value Ref Range   TSH 1.930 0.450 - 4.500 uIU/mL   GAD 7 : Generalized Anxiety Score 01/13/2018  Nervous, Anxious, on Edge 3  Control/stop worrying 1  Worry too much - different things 3  Trouble relaxing 3  Restless 3  Easily annoyed or irritable 1  Afraid - awful might happen 1  Total GAD 7 Score 15  Anxiety Difficulty Somewhat difficult    Depression screen [facility] 2/9 01/13/2018 10/16/2017 06/18/2017  Decreased Interest 1 0 1  Down, Depressed, Hopeless 1 0 1  PHQ - 2 Score 2 0 2  Altered sleeping 3 - 3  Tired, decreased energy 3 - 3  Change in appetite 1 - 3  Feeling bad or failure about yourself  0 - 1  Trouble concentrating 3 - 3  Moving slowly or fidgety/restless 3 - 3  Suicidal thoughts 0 - 0  PHQ-9 Score 15 - 18  Difficult doing work/chores Somewhat difficult - Somewhat difficult     Assessment:   1. Abnormal uterine bleeding  - CBC - Estradiol - FSH/LH - TSH - Cervicovaginal ancillary only  2. Anxiety and depression      Plan:   Labs: see orders. Will contact patient with results.   Encouraged patient to restart Zoloft and not to stop medication without a plan with PCP.   Discussed management and treatment options. Sample of Slynd given.   Reviewed red flag symptoms and when to call.   RTC as needed.    Gunnar Bulla, CNM [facility], [facility]

## 2018-01-27 ENCOUNTER — Other Ambulatory Visit: Payer: Self-pay

## 2018-01-27 MED ORDER — METRONIDAZOLE 500 MG PO TABS: 500.0000 mg | ORAL_TABLET | Freq: Two times a day (BID) | ORAL | 0 refills | Status: DC

## 2018-02-03 ENCOUNTER — Ambulatory Visit (INDEPENDENT_AMBULATORY_CARE_PROVIDER_SITE_OTHER): Payer: 59 | Admitting: Internal Medicine

## 2018-02-03 ENCOUNTER — Encounter: Payer: Self-pay | Admitting: Internal Medicine

## 2018-02-03 VITALS — BP 118/64 | HR 74 | Temp 98.3°F | Ht 61.0 in | Wt 170.2 lb

## 2018-02-03 DIAGNOSIS — F411 Generalized anxiety disorder: Secondary | ICD-10-CM

## 2018-02-03 DIAGNOSIS — N926 Irregular menstruation, unspecified: Secondary | ICD-10-CM

## 2018-02-03 DIAGNOSIS — D509 Iron deficiency anemia, unspecified: Secondary | ICD-10-CM | POA: Diagnosis not present

## 2018-02-03 DIAGNOSIS — E559 Vitamin D deficiency, unspecified: Secondary | ICD-10-CM | POA: Diagnosis not present

## 2018-02-03 DIAGNOSIS — G4700 Insomnia, unspecified: Secondary | ICD-10-CM

## 2018-02-03 DIAGNOSIS — R0683 Snoring: Secondary | ICD-10-CM

## 2018-02-03 MED ORDER — TEMAZEPAM 15 MG PO CAPS: 15.0000 mg | ORAL_CAPSULE | Freq: Every evening | ORAL | 2 refills | Status: DC | PRN

## 2018-02-03 MED ORDER — SERTRALINE HCL 50 MG PO TABS: 75.0000 mg | ORAL_TABLET | Freq: Every day | ORAL | 3 refills | Status: DC

## 2018-02-03 NOTE — Progress Notes (Signed)
Pre visit review using our clinic review tool, if applicable. No additional management support is needed unless otherwise documented below in the visit note. 

## 2018-02-03 NOTE — Patient Instructions (Addendum)
Ferrous gluconate try this iron 1x per day with vitamin C  L theanine for sleep, anxiety/stress chewable  D3 5000 IU daily vitamin D    Sleep Apnea Sleep apnea is a condition in which breathing pauses or becomes shallow during sleep. Episodes of sleep apnea usually last 10 seconds or longer, and they may occur as many as 20 times an hour. Sleep apnea disrupts your sleep and keeps your body from getting the rest that it needs. This condition can increase your risk of certain health problems, including:  Heart attack.  Stroke.  Obesity.  Diabetes.  Heart failure.  Irregular heartbeat. There are three kinds of sleep apnea:  Obstructive sleep apnea. This kind is caused by a blocked or collapsed airway.  Central sleep apnea. This kind happens when the part of the brain that controls breathing does not send the correct signals to the muscles that control breathing.  Mixed sleep apnea. This is a combination of obstructive and central sleep apnea. What are the causes? The most common cause of this condition is a collapsed or blocked airway. An airway can collapse or become blocked if:  Your throat muscles are abnormally relaxed.  Your tongue and tonsils are larger than normal.  You are overweight.  Your airway is smaller than normal. What increases the risk? This condition is more likely to develop in people who:  Are overweight.  Smoke.  Have a smaller than normal airway.  Are elderly.  Are female.  Drink alcohol.  Take sedatives or tranquilizers.  Have a family history of sleep apnea. What are the signs or symptoms? Symptoms of this condition include:  Trouble staying asleep.  Daytime sleepiness and tiredness.  Irritability.  Loud snoring.  Morning headaches.  Trouble concentrating.  Forgetfulness.  Decreased interest in sex.  Unexplained sleepiness.  Mood swings.  Personality changes.  Feelings of depression.  Waking up often during the night  to urinate.  Dry mouth.  Sore throat. How is this diagnosed? This condition may be diagnosed with:  A medical history.  A physical exam.  A series of tests that are done while you are sleeping (sleep study). These tests are usually done in a sleep lab, but they may also be done at home. How is this treated? Treatment for this condition aims to restore normal breathing and to ease symptoms during sleep. It may involve managing health issues that can affect breathing, such as high blood pressure or obesity. Treatment may include:  Sleeping on your side.  Using a decongestant if you have nasal congestion.  Avoiding the use of depressants, including alcohol, sedatives, and narcotics.  Losing weight if you are overweight.  Making changes to your diet.  Quitting smoking.  Using a device to open your airway while you sleep, such as: ? An oral appliance. This is a custom-made mouthpiece that shifts your lower jaw forward. ? A continuous positive airway pressure (CPAP) device. This device delivers oxygen to your airway through a mask. ? A nasal expiratory positive airway pressure (EPAP) device. This device has valves that you put into each nostril. ? A bi-level positive airway pressure (BPAP) device. This device delivers oxygen to your airway through a mask.  Surgery if other treatments do not work. During surgery, excess tissue is removed to create a wider airway. It is important to get treatment for sleep apnea. Without treatment, this condition can lead to:  High blood pressure.  Coronary artery disease.  (Men) An inability to achieve or maintain  an erection (impotence).  Reduced thinking abilities. Follow these instructions at home:  Make any lifestyle changes that your health care provider recommends.  Eat a healthy, well-balanced diet.  Take over-the-counter and prescription medicines only as told by your health care provider.  Avoid using depressants, including  alcohol, sedatives, and narcotics.  Take steps to lose weight if you are overweight.  If you were given a device to open your airway while you sleep, use it only as told by your health care provider.  Do not use any tobacco products, such as cigarettes, chewing tobacco, and e-cigarettes. If you need help quitting, ask your health care provider.  Keep all follow-up visits as told by your health care provider. This is important. Contact a health care provider if:  The device that you received to open your airway during sleep is uncomfortable or does not seem to be working.  Your symptoms do not improve.  Your symptoms get worse. Get help right away if:  You develop chest pain.  You develop shortness of breath.  You develop discomfort in your back, arms, or stomach.  You have trouble speaking.  You have weakness on one side of your body.  You have drooping in your face. These symptoms may represent a serious problem that is an emergency. Do not wait to see if the symptoms will go away. Get medical help right away. Call your local emergency services (911 in the [place] Do not drive yourself to the hospital. This information is not intended to replace advice given to you by your health care provider. Make sure you discuss any questions you have with your health care provider. Document Released: 12/21/2001 Document Revised: 07/29/2016 Document Reviewed: 10/10/2014 Elsevier Interactive Patient Education  [address]2019 [employer]   Results for Jacqueline Padilla, Jacqueline (MRN [id number]) as of 02/03/2018 14:59  Ref. Range 06/19/2017 10:08 01/13/2018 15:57  WBC Latest Ref Range: 3.4 - 10.8 x10E3/uL 4.8 4.3  WBC mixed population Latest Ref Range: 200 - 950 cells/uL 312   RBC Latest Ref Range: 3.77 - 5.28 x10E6/uL 4.60 4.82  Hemoglobin Latest Ref Range: 11.1 - 15.9 g/dL 8.4 (L) 40.912.8  HCT Latest Ref Range: 34.0 - 46.6 % 28.1 (L) 37.5  MCV Latest Ref Range: 79 - 97 fL 61.1 (L) 78 (L)  MCH Latest Ref  Range: 26.6 - 33.0 pg 18.3 (L) 26.6  MCHC Latest Ref Range: 31.5 - 35.7 g/dL 81.129.9 (L) 91.434.1  RDW Latest Ref Range: 12.3 - 15.4 % 19.6 (H) 14.4  Platelets Latest Ref Range: 150 - 450 x10E3/uL 375 376  MPV Latest Ref Range: 7.5 - 12.5 fL 9.5

## 2018-02-03 NOTE — Progress Notes (Signed)
Chief Complaint  Patient presents with  . Follow-up   F/u  1. Anxiety uncontrolled on 50 mg zoloft she stopped zoloft 50 cold [place] but anxiety flared and resumed. Anxiety still not controlled.  2. Insomnia restoril 15 mg qhs is helping but waking up at night  3. Irregular menses on and off since on new OCP wit Ob/gyn she is seeing heavy bleeding with clots disc call OB/GYN back to consider [place] r/o fibroids and w/u heavy menses as she also has iron def  4. Vitamin D def no longer taking vitamin D  5. Former smoker no longer smoking x 6 months  6. C/w snoring will refer pulm sleep study    Review of Systems  Constitutional: Negative for weight loss.  HENT: Negative for hearing loss.   Eyes: Negative for blurred vision.  Respiratory: Negative for shortness of breath.   Cardiovascular: Negative for chest pain.  Gastrointestinal: Negative for abdominal pain.  Genitourinary:       Abnormal vag bleeding    Skin: Negative for rash.  Neurological: Negative for headaches.  Psychiatric/Behavioral: The patient is nervous/anxious and has insomnia.    Past Medical History:  Diagnosis Date  . Anemia   . Anxiety   . Depression   . Herpes    1/2 +  . History of chicken pox   . History of urinary tract infection    Past Surgical History:  Procedure Laterality Date  . DILATION AND CURETTAGE OF UTERUS  2001   s/p miscarriage  . ENDOMETRIAL ABLATION  2006   hx of abnormal pap  . TONSILLECTOMY AND ADENOIDECTOMY  1994  . TUBAL LIGATION  2006   Family History  Problem Relation Age of Onset  . Diabetes Mother   . Hypertension Mother   . Hypertension Father   . Heart disease Father        CABG  . Cancer Maternal Grandfather        Lung cancer - non smoker  . Breast cancer Paternal Grandmother   . Bipolar disorder Sister   . Stroke Maternal Grandmother   . Bipolar disorder Cousin   . Ovarian cancer Neg Hx   . Colon cancer Neg Hx    Social History   Socioeconomic History  .  Marital status: Married    Spouse name: Not on file  . Number of children: 1  . Years of education: 13  . Highest education level: Not on file  Occupational History  . Occupation: [employer]    Comment: [employer]  . Occupation: Associate Professor    Comment: [employer]  . Financial resource strain: Not on file  . Food insecurity:    Worry: Not on file    Inability: Not on file  . Transportation needs:    Medical: Not on file    Non-medical: Not on file  Tobacco Use  . Smoking status: Former Smoker    Packs/day: 0.25    Years: 18.00    Pack years: 4.50    Start date: 07/14/2017  . Smokeless tobacco: Never Used  Substance and Sexual Activity  . Alcohol use: Yes    Alcohol/week: 0.0 standard drinks    Comment: Occassional glass of wine  . Drug use: No  . Sexual activity: Yes    Partners: Male    Birth control/protection: Surgical  Lifestyle  . Physical activity:    Days per week: Not on file    Minutes per session: Not on file  .  Stress: Not on file  Relationships  . Social connections:    Talks on phone: Not on file    Gets together: Not on file    Attends religious service: Not on file    Active member of club or organization: Not on file    Attends meetings of clubs or organizations: Not on file    Relationship status: Not on file  . Intimate partner violence:    Fear of current or ex partner: Not on file    Emotionally abused: Not on file    Physically abused: Not on file    Forced sexual activity: Not on file  Other Topics Concern  . Not on file  Social History Narrative   [name] grew up in [place], [place]. She lives in [place] with her Husband and their son. Her husband has 3 daughters that live with them as well. She has 12 y.o son as of 06/2017. 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 She enjoys spending time with the family, outdoor  activities, shopping.   Married x 16 years       Exercise - not currently   Caffeine - 1 cup daily   Current Meds  Medication Sig  . Biotin 1 MG CAPS Take by mouth.  . cyclobenzaprine (FLEXERIL) 5 MG tablet Take 1 tablet (5 mg total) by mouth at bedtime as needed for muscle spasms.  . ferrous sulfate 325 (65 FE) MG EC tablet Take 325 mg by mouth once a week.  . metroNIDAZOLE (FLAGYL) 500 MG tablet Take 1 tablet (500 mg total) by mouth 2 (two) times daily.  . metroNIDAZOLE (METROGEL VAGINAL) 0.75 % vaginal gel Place 1 Applicatorful vaginally at bedtime.  . sertraline (ZOLOFT) 50 MG tablet Take 1 tablet (50 mg total) by mouth daily.  . temazepam (RESTORIL) 15 MG capsule Take 1 capsule (15 mg total) by mouth at bedtime as needed for sleep. 1 hour before bed   No Known Allergies Recent Results (from the past 2160 hour(s))  Cervicovaginal ancillary only     Status: Abnormal   Collection Time: 01/13/18 12:00 AM  Result Value Ref Range   Bacterial vaginitis **POSITIVE for Gardnerella vaginalis** (A)     Comment: Normal Reference Range - Negative   Candida vaginitis Negative for Candida species     Comment: Normal Reference Range - Negative   Chlamydia Negative     Comment: Normal Reference Range - Negative   Neisseria gonorrhea Negative     Comment: Normal Reference Range - Negative   Trichomonas Negative     Comment: Normal Reference Range - Negative  CBC     Status: Abnormal   Collection Time: 01/13/18  3:57 PM  Result Value Ref Range   WBC 4.3 3.4 - 10.8 x10E3/uL   RBC 4.82 3.77 - 5.28 x10E6/uL   Hemoglobin 12.8 11.1 - 15.9 g/dL   Hematocrit 37.5 34.0 - 46.6 %   MCV 78 (L) 79 - 97 fL   MCH 26.6 26.6 - 33.0 pg   MCHC 34.1 31.5 - 35.7 g/dL   RDW 14.4 12.3 - 15.4 %    Comment: **Effective January 19, 2018, the RDW pediatric reference**   interval will be removed and the adult reference interval   will be changing to:  Female 11.7 - 15.4                                                       Female 11.6 - 15.4    Platelets 376 150 - 450 x10E3/uL  Estradiol     Status: None   Collection Time: 01/13/18  3:57 PM  Result Value Ref Range   Estradiol 50.6 pg/mL    Comment:                     Adult Female:                       Follicular phase   16.1 -   166.0                       Ovulation phase    85.8 -   498.0                       Luteal phase       43.8 -   211.0                       Postmenopausal     <6.0 -    54.7                     Pregnancy                       1st trimester     215.0 - >4300.0                     Girls (1-10 years)    6.0 -    27.0 Roche ECLIA methodology   FSH/LH     Status: None   Collection Time: 01/13/18  3:57 PM  Result Value Ref Range   LH 7.6 mIU/mL    Comment:                     Adult Female:                       Follicular phase      2.4 -  12.6                       Ovulation phase      14.0 -  95.6                       Luteal phase          1.0 -  11.4                       Postmenopausal        7.7 -  58.5    FSH 8.1 mIU/mL    Comment:                     Adult Female:                       Follicular phase      3.5 -  12.5  Ovulation phase       4.7 -  21.5                       Luteal phase          1.7 -   7.7                       Postmenopausal       25.8 - 134.8   TSH     Status: None   Collection Time: 01/13/18  3:57 PM  Result Value Ref Range   TSH 1.930 0.450 - 4.500 uIU/mL   Objective  Body mass index is 32.16 kg/m. Wt Readings from Last 3 Encounters:  02/03/18 170 lb 3.2 oz (77.2 kg)  01/13/18 167 lb 6.4 oz (75.9 kg)  01/09/18 167 lb 3.2 oz (75.8 kg)   Temp Readings from Last 3 Encounters:  02/03/18 98.3 F (36.8 C) (Oral)  10/16/17 98 F (36.7 C) (Oral)  07/10/17 98.2 F (36.8 C) (Oral)   BP Readings from Last 3 Encounters:  02/03/18 118/64  01/13/18 (!) 135/102  01/09/18 (!) 136/99   Pulse Readings from Last 3 Encounters:  02/03/18 74   01/13/18 66  01/09/18 79    Physical Exam Vitals signs reviewed.  Constitutional:      Appearance: Normal appearance. She is well-developed and well-groomed. She is obese.  HENT:     Head: Normocephalic and atraumatic.     Nose: Nose normal.     Mouth/Throat:     Mouth: Mucous membranes are moist.     Pharynx: Oropharynx is clear.  Eyes:     Conjunctiva/sclera: Conjunctivae normal.     Pupils: Pupils are equal, round, and reactive to light.  Cardiovascular:     Rate and Rhythm: Normal rate and regular rhythm.     Pulses: Normal pulses.     Heart sounds: Normal heart sounds. No murmur.  Pulmonary:     Effort: Pulmonary effort is normal.     Breath sounds: Normal breath sounds.  Skin:    General: Skin is warm and dry.  Neurological:     General: No focal deficit present.     Mental Status: She is alert and oriented to person, place, and time. Mental status is at baseline.     Gait: Gait normal.  Psychiatric:        Attention and Perception: Attention and perception normal.        Mood and Affect: Mood and affect normal.        Speech: Speech normal.        Behavior: Behavior normal. Behavior is cooperative.        Thought Content: Thought content normal.        Cognition and Memory: Cognition and memory normal.        Judgment: Judgment normal.     Assessment   1. Anxiety/insomnia  2. Irregular menses on OCP  3. Vitamin d def  4. Iron def  5. Former smoker no smoking since 07/2017  6. HM 7. snoring Plan   1. Inc zoloft to 75 mg qd same dose restoril consider increase in future  2. rec call [facility] and consider [place] to w/u fibroids  3. D3 5000 IU qd  4. rec take ferrous gluconate instead of sulfate causing diarrhea 1x per day if can tolerate 2x per day  5. Congratulated  6.  Flu shot had 09/24/17  Tdap had  due at f/u  MMR immune  Hep B immune   Pap at f/u last 06/01/15 neg neg hpv -will rec OB/GYN do pap [facility]  mammo 07/23/17 negative  rec smoking  cessation Do physical with labs at f/u  7. Refer pulm for sleep study  Provider: Dr. Olivia Mackie [facility]

## 2018-02-04 ENCOUNTER — Encounter: Payer: Self-pay | Admitting: Certified Nurse Midwife

## 2018-02-04 ENCOUNTER — Encounter: Payer: Self-pay | Admitting: Internal Medicine

## 2018-02-04 DIAGNOSIS — G4700 Insomnia, unspecified: Secondary | ICD-10-CM | POA: Insufficient documentation

## 2018-02-04 DIAGNOSIS — R0683 Snoring: Secondary | ICD-10-CM | POA: Insufficient documentation

## 2018-02-04 LAB — VITAMIN D 25 HYDROXY (VIT D DEFICIENCY, FRACTURES): Vit D, 25-Hydroxy: 47 ng/mL (ref 30–100)

## 2018-02-04 LAB — IRON,TIBC AND FERRITIN PANEL
%SAT: 8 % (calc) — ABNORMAL LOW (ref 16–45)
Ferritin: 10 ng/mL — ABNORMAL LOW (ref 16–232)
Iron: 33 ug/dL — ABNORMAL LOW (ref 40–190)
TIBC: 392 mcg/dL (calc) (ref 250–450)

## 2018-02-18 ENCOUNTER — Encounter: Payer: Self-pay | Admitting: Certified Nurse Midwife

## 2018-02-18 ENCOUNTER — Encounter: Payer: Self-pay | Admitting: Internal Medicine

## 2018-02-19 ENCOUNTER — Other Ambulatory Visit: Payer: Self-pay | Admitting: Internal Medicine

## 2018-02-19 DIAGNOSIS — F411 Generalized anxiety disorder: Secondary | ICD-10-CM

## 2018-02-19 DIAGNOSIS — G4700 Insomnia, unspecified: Secondary | ICD-10-CM

## 2018-02-19 MED ORDER — TEMAZEPAM 30 MG PO CAPS: 30.0000 mg | ORAL_CAPSULE | Freq: Every evening | ORAL | 5 refills | Status: DC | PRN

## 2018-02-19 MED ORDER — SERTRALINE HCL 50 MG PO TABS: 75.0000 mg | ORAL_TABLET | Freq: Every day | ORAL | 3 refills | Status: DC

## 2018-03-23 ENCOUNTER — Encounter: Payer: Self-pay | Admitting: Family Medicine

## 2018-03-23 ENCOUNTER — Ambulatory Visit (INDEPENDENT_AMBULATORY_CARE_PROVIDER_SITE_OTHER): Payer: 59 | Admitting: Family Medicine

## 2018-03-23 ENCOUNTER — Institutional Professional Consult (permissible substitution): Payer: Self-pay | Admitting: Internal Medicine

## 2018-03-23 VITALS — BP 110/80 | HR 104 | Temp 99.9°F | Resp 16 | Ht 61.0 in | Wt 165.8 lb

## 2018-03-23 DIAGNOSIS — B9789 Other viral agents as the cause of diseases classified elsewhere: Secondary | ICD-10-CM

## 2018-03-23 DIAGNOSIS — J069 Acute upper respiratory infection, unspecified: Secondary | ICD-10-CM

## 2018-03-23 LAB — POC INFLUENZA A&B (BINAX/QUICKVUE)
INFLUENZA B, POC: NEGATIVE
Influenza A, POC: NEGATIVE

## 2018-03-23 MED ORDER — ALBUTEROL SULFATE HFA 108 (90 BASE) MCG/ACT IN AERS: 2.0000 | INHALATION_SPRAY | Freq: Four times a day (QID) | RESPIRATORY_TRACT | 0 refills | Status: DC | PRN

## 2018-03-23 MED ORDER — BENZONATATE 100 MG PO CAPS: 100.0000 mg | ORAL_CAPSULE | Freq: Three times a day (TID) | ORAL | 0 refills | Status: DC | PRN

## 2018-03-23 MED ORDER — HYDROCOD POLST-CPM POLST ER 10-8 MG/5ML PO SUER: 5.0000 mL | Freq: Two times a day (BID) | ORAL | 0 refills | Status: DC | PRN

## 2018-03-23 MED ORDER — METHYLPREDNISOLONE 4 MG PO TBPK: ORAL_TABLET | ORAL | 0 refills | Status: DC

## 2018-03-23 NOTE — Patient Instructions (Signed)

## 2018-03-23 NOTE — Progress Notes (Signed)
Subjective:    Patient ID: Jacqueline Padilla, female    DOB: [birth year], 43 y.o.   MRN: [id number]  HPI   Patient presents to clinic complaining of cough, body aches, chest congestion for 1 day.  Denies any recent travel.  Has used NyQuil cold and flu with minimal help and reducing symptoms.  Denies phlegm production with cough.  Denies nausea, vomiting or diarrhea.  Denies fever or chills.  Patient Active Problem List   Diagnosis Date Noted  . Insomnia 02/04/2018  . Snoring 02/04/2018  . GAD (generalized anxiety disorder) 07/10/2017  . Iron deficiency anemia 07/10/2017  . Vitamin D deficiency 06/23/2017  . Tobacco abuse 06/18/2017  . Anxiety and depression 06/18/2017  . Anemia 06/18/2017  . STD exposure 05/30/2017  . Muscle spasm 05/30/2017  . Hyperlipidemia 06/01/2015  . Prediabetes 06/01/2015  . Annual physical exam 03/08/2014  . Hx of abnormal cervical Pap smear 05/05/2013   Social History   Tobacco Use  . Smoking status: Former Smoker    Packs/day: 0.25    Years: 18.00    Pack years: 4.50    Start date: 07/14/2017  . Smokeless tobacco: Never Used  Substance Use Topics  . Alcohol use: Yes    Alcohol/week: 0.0 standard drinks    Comment: Occassional glass of wine   Review of Systems  Constitutional: Negative for chills, and fever. +fatigue HENT: +some nasal congestion. Negative ear pain, sinus pain and sore throat.   Eyes: Negative.   Respiratory: +cough, some wheeze at night. No SOB Cardiovascular: Negative for chest pain, palpitations and leg swelling.  Gastrointestinal: Negative for abdominal pain, diarrhea, nausea and vomiting.  Genitourinary: Negative for dysuria, frequency and urgency.  Musculoskeletal: +body aches Skin: Negative for color change, pallor and rash.  Neurological: Negative for syncope, light-headedness and headaches.  Psychiatric/Behavioral: The patient is not nervous/anxious.       Objective:   Physical Exam Vitals signs and nursing  note reviewed.  Constitutional:      General: She is not in acute distress.    Appearance: She is not toxic-appearing.     Comments: Appears tired  HENT:     Head: Normocephalic and atraumatic.     Ears:     Comments: Mild fullness bilat TMs    Nose: Congestion and rhinorrhea present.     Mouth/Throat:     Mouth: Mucous membranes are moist.     Pharynx: Posterior oropharyngeal erythema present. No oropharyngeal exudate.  Eyes:     General: No scleral icterus.       Right eye: No discharge.        Left eye: No discharge.     Extraocular Movements: Extraocular movements intact.     Conjunctiva/sclera: Conjunctivae normal.  Neck:     Musculoskeletal: Neck supple. No neck rigidity.  Cardiovascular:     Rate and Rhythm: Tachycardia present.  Pulmonary:     Effort: Pulmonary effort is normal. No respiratory distress.     Breath sounds: Normal breath sounds. No wheezing, rhonchi or rales.     Comments: +moist cough present Abdominal:     General: Abdomen is flat. Bowel sounds are normal.     Palpations: Abdomen is soft.  Lymphadenopathy:     Cervical: No cervical adenopathy.  Skin:    General: Skin is warm and dry.  Neurological:     Mental Status: She is alert and oriented to person, place, and time.  Psychiatric:        Mood and  Affect: Mood normal.        Behavior: Behavior normal.     Vitals:   03/23/18 1023  BP: 110/80  Pulse: (!) 104  Resp: 16  Temp: 99.9 F (37.7 C)  SpO2: 97%       Assessment & Plan:   Viral URI with cough - patient's point-of-care flu swab is negative in clinic.  Suspect patient's symptoms are related to a viral upper respiratory illness that is flulike in nature.  She has not traveled anywhere, so I do not suspect her for coronavirus.  She will use albuterol inhaler as needed for any shortness of breath or wheezing, she was take steroid taper to reduce inflammation.  She will alternate Tylenol and Motrin if needed, prescribed Tessalon Perles  as a nondrowsy cough suppression option and Tussionex as a cough syrup to use prior to sleep.  Out of work note given over the next 3 days to allow time to rest and recover.  Strict return precautions given, patient advised that if symptoms worsen, she is to call office and or go to ER right away for evaluation.

## 2018-04-01 ENCOUNTER — Encounter: Payer: Self-pay | Admitting: Certified Nurse Midwife

## 2018-04-12 ENCOUNTER — Encounter: Payer: Self-pay | Admitting: Internal Medicine

## 2018-04-13 ENCOUNTER — Telehealth: Payer: Self-pay | Admitting: Internal Medicine

## 2018-04-13 ENCOUNTER — Ambulatory Visit: Payer: Self-pay

## 2018-04-13 ENCOUNTER — Other Ambulatory Visit: Payer: Self-pay | Admitting: Internal Medicine

## 2018-04-13 ENCOUNTER — Telehealth: Payer: Self-pay

## 2018-04-13 DIAGNOSIS — E119 Type 2 diabetes mellitus without complications: Secondary | ICD-10-CM

## 2018-04-13 DIAGNOSIS — R739 Hyperglycemia, unspecified: Secondary | ICD-10-CM | POA: Diagnosis not present

## 2018-04-13 NOTE — Telephone Encounter (Signed)
Spoken to patient, [facility] placed pt on metformin for the first three days taken 500mg  once aday then after the third day she will be taking 1000mg  of metformin daily.  Pt would like referral to Connye Burkitt at nutritionist.

## 2018-04-13 NOTE — Telephone Encounter (Signed)
Left message for patient to return call back. [facility] may give and obtain information. Jacqueline Padilla is willing to see patient for High BS if patient does not go to ED.

## 2018-04-13 NOTE — Telephone Encounter (Signed)
Has pt gone to the ED  Is another provider willing or available to see her in office today?  See phone call and my chart sugars elevated   TMS

## 2018-04-13 NOTE — Telephone Encounter (Signed)
Pt. Called to report feeling poorly over the past 2 weeks.  C/o dry mouth, feeling sluggish, having increased frequency of urination, and intermittent light headed feeling.  Reported she has been "borderline diabetic".  Due to her symptoms, she purchased a blood sugar test kit on Saturday.  Reported readings of 436 and 437 on 3/28.  Reported readings ranging 262-465 on 3/29.  Stated she only had a spinach salad on Sunday eve at 6:30 PM, and got reading of 465 at 7:05 PM.   Reported blood sugar, fasting at 7:30 AM of 420.   Noted pt. Was on a Medrol dose pack 3/9-3/14.  Stated that she has felt bad the past 2 weeks, after being on the Medrol dose pack.  Is at work at present time; c/o feeling "a little light-headed, sluggish, and a little clammy."  Called FC at office.  Stated that Dr. Olivia Mackie was not in today, and to send to [facility] or ER.    Pt. Advised to go to ER at this time.  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              Reason for Disposition . Blood glucose > 400 mg/dL (22.2 mmol/L)  Answer Assessment - Initial Assessment Questions 1. BLOOD GLUCOSE: "What is your blood glucose level?"     7:30 AM 420 today; 3/28 436, 437; 3/29 freq. Checks [phone]  2. ONSET: "When did you check the blood glucose?"     See above  3. USUAL RANGE: "What is your glucose level usually?" (e.g., usual fasting morning value, usual evening value)     *No Answer* 4. KETONES: "Do you check for ketones (urine or blood test strips)?" If yes, ask: "What does the test show now?"      no 5. TYPE 1 or 2:  "Do you know what type of diabetes you have?"  (e.g., Type 1, Type 2, Gestational; doesn't know)      "borderline"  6. INSULIN: "Do you take insulin?" "What type of insulin(s) do you use? What is the mode of delivery? (syringe, pen (e.g., injection or  pump)?"      No  7. DIABETES PILLS: "Do you take any pills for your diabetes?" If yes, ask: "Have you missed taking any pills recently?"    No  8.  OTHER SYMPTOMS: "Do you have any symptoms?" (e.g., fever, frequent urination, difficulty breathing, dizziness, weakness, vomiting)     Dry mouth, sluggish, intermittent dizziness, frequent urination 9. PREGNANCY: "Is there any chance you are pregnant?" "When was your last menstrual period?"     LMP: irregular cycles; light period 3/2-3/15; heavy perior 316-3/20 with  Protocols used: DIABETES - HIGH BLOOD SUGAR-A-AH

## 2018-04-13 NOTE — Telephone Encounter (Signed)
Noted this is probably best  Last A1C 6.0 06/2017 but with elevated blood sugars to the 260s to 460s she could be diabetic now  ER is best for now or urgent care to work up if not feeling well today and for the last 2 weeks   TMS

## 2018-04-14 ENCOUNTER — Other Ambulatory Visit: Payer: Self-pay | Admitting: Internal Medicine

## 2018-04-14 ENCOUNTER — Other Ambulatory Visit: Payer: Self-pay

## 2018-04-14 ENCOUNTER — Encounter: Payer: 59 | Attending: Internal Medicine | Admitting: Dietician

## 2018-04-14 ENCOUNTER — Encounter: Payer: Self-pay | Admitting: Dietician

## 2018-04-14 VITALS — BP 120/88 | Ht 60.0 in | Wt 159.8 lb

## 2018-04-14 DIAGNOSIS — Z713 Dietary counseling and surveillance: Secondary | ICD-10-CM | POA: Diagnosis not present

## 2018-04-14 DIAGNOSIS — Z6831 Body mass index (BMI) 31.0-31.9, adult: Secondary | ICD-10-CM | POA: Insufficient documentation

## 2018-04-14 DIAGNOSIS — E119 Type 2 diabetes mellitus without complications: Secondary | ICD-10-CM | POA: Diagnosis not present

## 2018-04-14 DIAGNOSIS — E1165 Type 2 diabetes mellitus with hyperglycemia: Secondary | ICD-10-CM

## 2018-04-14 DIAGNOSIS — R7303 Prediabetes: Secondary | ICD-10-CM

## 2018-04-14 DIAGNOSIS — R739 Hyperglycemia, unspecified: Secondary | ICD-10-CM

## 2018-04-14 MED ORDER — BLOOD GLUCOSE METER KIT: PACK | 0 refills | Status: DC

## 2018-04-14 NOTE — Progress Notes (Signed)
Diabetes Self-Management Education  Visit Type: Comprehensive  Appt. Start Time: 0910 Appt. End Time: 1030  04/14/2018  [name], identified by name and date of birth, is a 43 y.o. female with a diagnosis of Diabetes: Type 2  .   ASSESSMENT  Blood pressure 120/88, height 5' (1.524 m), weight 159 lb 12.8 oz (72.5 kg). Body mass index is 31.21 kg/m.  Diabetes Self-Management Education - 04/14/18 0938      Visit Information   Visit Type  Comprehensive      Health Coping   How would you rate your overall health?  Fair      Pre-Education Assessment   Patient understands the diabetes disease and treatment process.  Needs Review    Patient understands incorporating nutritional management into lifestyle.  Needs Instruction    Patient undertands incorporating physical activity into lifestyle.  Needs Instruction    Patient understands using medications safely.  Needs Review    Patient understands monitoring blood glucose, interpreting and using results  Needs Review    Patient understands prevention, detection, and treatment of acute complications.  Needs Review    Patient understands prevention, detection, and treatment of chronic complications.  Needs Instruction    Patient understands how to develop strategies to address psychosocial issues.  Needs Instruction    Patient understands how to develop strategies to promote health/change behavior.  Needs Review      Complications   How often do you check your blood sugar?  1-2 times/day    Fasting Blood glucose range (mg/dL)  >944    Have you had a dilated eye exam in the past 12 months?  Yes    Have you had a dental exam in the past 12 months?  Yes    Are you checking your feet?  No      Dietary Intake   Breakfast  none or banana    Snack (morning)  Nekot peanut butter cookies    Lunch  pre-made peanut butter + honey/ strawberry sandwich, 1-2    Snack (afternoon)  fruit and/or nuts    Dinner  chicken/ shrimp/ lean  beef + pasta often + veg; sometimes skips or eats snacks    Snack (evening)  hot tamales candy    Beverage(s)  water, occasional green tea      Exercise   Exercise Type  ADL's      Patient Education   Previous Diabetes Education  No    Disease state   Factors that contribute to the development of diabetes    Nutrition management   Role of diet in the treatment of diabetes and the relationship between the three main macronutrients and blood glucose level;Meal timing in regards to the patients' current diabetes medication.    Physical activity and exercise   Role of exercise on diabetes management, blood pressure control and cardiac health.;Helped patient identify appropriate exercises in relation to his/her diabetes, diabetes complications and other health issue.    Medications  Reviewed patients medication for diabetes, action, purpose, timing of dose and side effects.    Monitoring  Taught/discussed recording of test results and interpretation of SMBG.;Daily foot exams    Acute complications  Other (comment)   symptoms of hyper & hypoglycemia, info on proper treatment   Chronic complications  Relationship between chronic complications and blood glucose control    Psychosocial adjustment  Role of stress on diabetes    Personal strategies to promote health  Lifestyle issues that need to be  addressed for better diabetes care      Outcomes   Expected Outcomes  Demonstrated interest in learning. Expect positive outcomes    Future DMSE  Other (comment)   class 1 in 3 weeks     Subsequent Visit   Since your last visit have you continued or begun to take your medications as prescribed?  Yes       Individualized Plan for Diabetes Self-Management Training:   Learning Objective:  Patient will have a greater understanding of diabetes self-management. Patient education plan is to attend individual and/or group sessions per assessed needs and concerns.   Plan:   There are no Patient  Instructions on file for this visit.  Expected Outcomes:  Demonstrated interest in learning. Expect positive outcomes  Education material provided: General diet guidelines for diabetes; Plate planner with food lists; blood glucose record; initial goals  If problems or questions, patient to contact team via:  Phone and Email  Future DSME appointment: Other (comment)(class 1 in 3 weeks)

## 2018-04-14 NOTE — Patient Instructions (Signed)
   Check blood sugars 2x a day before breakfast and 2hrs. after supper, every day.  Eat three meals a day, 1-2 snacks a day.  Space meals 4-6 hours apart.  Bring blood sugar record to next visit (class 1)  Return for classes on:  Mondays, 05/04/18, 05/11/18. And 05/18/18.

## 2018-04-15 ENCOUNTER — Other Ambulatory Visit: Payer: Self-pay | Admitting: Internal Medicine

## 2018-04-15 DIAGNOSIS — E1165 Type 2 diabetes mellitus with hyperglycemia: Secondary | ICD-10-CM

## 2018-04-15 DIAGNOSIS — Z794 Long term (current) use of insulin: Principal | ICD-10-CM

## 2018-04-15 DIAGNOSIS — E119 Type 2 diabetes mellitus without complications: Secondary | ICD-10-CM

## 2018-04-15 MED ORDER — EMPAGLIFLOZIN 10 MG PO TABS: 10.0000 mg | ORAL_TABLET | Freq: Every day | ORAL | 0 refills | Status: DC

## 2018-04-17 ENCOUNTER — Telehealth: Payer: Self-pay | Admitting: Internal Medicine

## 2018-04-17 ENCOUNTER — Other Ambulatory Visit: Payer: Self-pay

## 2018-04-17 ENCOUNTER — Ambulatory Visit (INDEPENDENT_AMBULATORY_CARE_PROVIDER_SITE_OTHER): Payer: 59 | Admitting: Certified Nurse Midwife

## 2018-04-17 ENCOUNTER — Encounter: Payer: Self-pay | Admitting: Certified Nurse Midwife

## 2018-04-17 DIAGNOSIS — N939 Abnormal uterine and vaginal bleeding, unspecified: Secondary | ICD-10-CM | POA: Diagnosis not present

## 2018-04-17 DIAGNOSIS — Z794 Long term (current) use of insulin: Secondary | ICD-10-CM | POA: Diagnosis not present

## 2018-04-17 DIAGNOSIS — E559 Vitamin D deficiency, unspecified: Secondary | ICD-10-CM | POA: Diagnosis not present

## 2018-04-17 DIAGNOSIS — E1165 Type 2 diabetes mellitus with hyperglycemia: Secondary | ICD-10-CM | POA: Diagnosis not present

## 2018-04-17 DIAGNOSIS — E119 Type 2 diabetes mellitus without complications: Secondary | ICD-10-CM | POA: Diagnosis not present

## 2018-04-17 NOTE — Progress Notes (Signed)
Virtual Visit via Telephone Note  I connected with Jacqueline Padilla on 04/17/18 at 2:39 PM by telephone and verified that I am speaking with the correct person using two identifiers.   I discussed the limitations, risks, security and privacy concerns of performing an evaluation and management service by telephone and the availability of in person appointments. I also discussed with the patient that there may be a patient responsible charge related to this service. The patient expressed understanding and agreed to proceed.   History of Present Illness:  Patient previously seen in office for abnormal uterine bleeding and started on Slynd. Labs were within normal limits.    Recently, diagnosed with diabetes, see chart.   Observations/Objective:  Over the month of March while taking birth control pill daily, patient reports spotting x 1 week followed by light bleeding requiring a thin pad. The next week she had bleeding with clotting that required the use of tampon and thin pad. Last week, she only noticed spotting with wiping during bathroom visit.   Denies difficulty breathing or respiratory distress, chest pain, abdominal pain, excessive vaginal bleeding, dysuria, and leg pain or swelling.   Assessment:  Abnormal uterine bleeding Perimenopause POP usage  Plan:  Discussed treatment options including continued POP use, Depo-provera and MIrena placement.   Patient would like to proceed with IUD placement.   Education regarding IUD insertion; see AVS.   Follow Up Instructions:  Patient scheduled for Mirena IUD placement next Thursday, April 23, 2018.    I discussed the assessment and treatment plan with the patient. The patient was provided an opportunity to ask questions and all were answered. The patient agreed with the plan and demonstrated an understanding of the instructions.   The patient was advised to call back or seek an in-person evaluation if the symptoms worsen or if  the condition fails to improve as anticipated.  I provided 10 minutes of non-face-to-face time during this encounter.   Gunnar Bulla, CNM [facility], [facility] 04/17/18 1449

## 2018-04-17 NOTE — Progress Notes (Signed)
Received call from [facility] for a tele visit with this pt who would like to review her options. Allergies, meds, history all reviewed and up dated. Call transferred to ML.

## 2018-04-17 NOTE — Patient Instructions (Addendum)
Abnormal Uterine Bleeding Abnormal uterine bleeding means bleeding more than usual from your uterus. It can include:  Bleeding between periods.  Bleeding after sex.  Bleeding that is heavier than normal.  Periods that last longer than usual.  Bleeding after you have stopped having your period (menopause). There are many problems that may cause this. You should see a doctor for any kind of bleeding that is not normal. Treatment depends on the cause of the bleeding. Follow these instructions at home:  Watch your condition for any changes.  Do not use tampons, douche, or have sex, if your doctor tells you not to.  Change your pads often.  Get regular well-woman exams. Make sure they include a pelvic exam and cervical cancer screening.  Keep all follow-up visits as told by your doctor. This is important. Contact a doctor if:  The bleeding lasts more than one week.  You feel dizzy at times.  You feel like you are going to throw up (nauseous).  You throw up. Get help right away if:  You pass out.  You have to change pads every hour.  You have belly (abdominal) pain.  You have a fever.  You get sweaty.  You get weak.  You passing large blood clots from your vagina. Summary  Abnormal uterine bleeding means bleeding more than usual from your uterus.  There are many problems that may cause this. You should see a doctor for any kind of bleeding that is not normal.  Treatment depends on the cause of the bleeding. This information is not intended to replace advice given to you by your health care provider. Make sure you discuss any questions you have with your health care provider. Document Released: 10/28/2008 Document Revised: 12/26/2015 Document Reviewed: 12/26/2015 Elsevier Interactive Patient Education  2019 [employer].  Perimenopause  Perimenopause is the normal time of life before and after menstrual periods stop completely (menopause). Perimenopause can  begin 2-8 years before menopause, and it usually lasts for 1 year after menopause. During perimenopause, the ovaries may or may not produce an egg. What are the causes? This condition is caused by a natural change in hormone levels that happens as you get older. What increases the risk? This condition is more likely to start at an earlier age if you have certain medical conditions or treatments, including:  A tumor of the pituitary gland in the brain.  A disease that affects the ovaries and hormone production.  Radiation treatment for cancer.  Certain cancer treatments, such as chemotherapy or hormone (anti-estrogen) therapy.  Heavy smoking and excessive alcohol use.  Family history of early menopause. What are the signs or symptoms? Perimenopausal changes affect each woman differently. Symptoms of this condition may include:  Hot flashes.  Night sweats.  Irregular menstrual periods.  Decreased sex drive.  Vaginal dryness.  Headaches.  Mood swings.  Depression.  Memory problems or trouble concentrating.  Irritability.  Tiredness.  Weight gain.  Anxiety.  Trouble getting pregnant. How is this diagnosed? This condition is diagnosed based on your medical history, a physical exam, your age, your menstrual history, and your symptoms. Hormone tests may also be done. How is this treated? In some cases, no treatment is needed. You and your health care provider should make a decision together about whether treatment is necessary. Treatment will be based on your individual condition and preferences. Various treatments are available, such as:  Menopausal hormone therapy (MHT).  Medicines to treat specific symptoms.  Acupuncture.  Vitamin or  herbal supplements. Before starting treatment, make sure to let your health care provider know if you have a personal or family history of:  Heart disease.  Breast cancer.  Blood clots.  Diabetes.  Osteoporosis. Follow  these instructions at home: Lifestyle  Do not use any products that contain nicotine or tobacco, such as cigarettes and e-cigarettes. If you need help quitting, ask your health care provider.  Eat a balanced diet that includes fresh fruits and vegetables, whole grains, soybeans, eggs, lean meat, and low-fat dairy.  Get at least 30 minutes of physical activity on 5 or more days each week.  Avoid alcoholic and caffeinated beverages, as well as spicy foods. This may help prevent hot flashes.  Get 7-8 hours of sleep each night.  Dress in layers that can be removed to help you manage hot flashes.  Find ways to manage stress, such as deep breathing, meditation, or journaling. General instructions  Keep track of your menstrual periods, including: ? When they occur. ? How heavy they are and how long they last. ? How much time passes between periods.  Keep track of your symptoms, noting when they start, how often you have them, and how long they last.  Take over-the-counter and prescription medicines only as told by your health care provider.  Take vitamin supplements only as told by your health care provider. These may include calcium, vitamin E, and vitamin D.  Use vaginal lubricants or moisturizers to help with vaginal dryness and improve comfort during sex.  Talk with your health care provider before starting any herbal supplements.  Keep all follow-up visits as told by your health care provider. This is important. This includes any group therapy or counseling. Contact a health care provider if:  You have heavy vaginal bleeding or pass blood clots.  Your period lasts more than 2 days longer than normal.  Your periods are recurring sooner than 21 days.  You bleed after having sex. Get help right away if:  You have chest pain, trouble breathing, or trouble talking.  You have severe depression.  You have pain when you urinate.  You have severe headaches.  You have vision  problems. Summary  Perimenopause is the time when a woman's body begins to move into menopause. This may happen naturally or as a result of other health problems or medical treatments.  Perimenopause can begin 2-8 years before menopause, and it usually lasts for 1 year after menopause.  Perimenopausal symptoms can be managed through medicines, lifestyle changes, and complementary therapies such as acupuncture. This information is not intended to replace advice given to you by your health care provider. Make sure you discuss any questions you have with your health care provider. Document Released: 02/08/2004 Document Revised: 02/06/2016 Document Reviewed: 02/06/2016 Elsevier Interactive Patient Education  2019 [employer].  Levonorgestrel intrauterine device (IUD) What is this medicine? LEVONORGESTREL IUD (LEE voe nor jes trel) is a contraceptive (birth control) device. The device is placed inside the uterus by a healthcare professional. It is used to prevent pregnancy. This device can also be used to treat heavy bleeding that occurs during your period. This medicine may be used for other purposes; ask your health care provider or pharmacist if you have questions. COMMON BRAND NAME(S): Cameron Ali What should I tell my health care provider before I take this medicine? They need to know if you have any of these conditions: -abnormal Pap smear -cancer of the breast, uterus, or cervix -diabetes -endometritis -genital or  pelvic infection now or in the past -have more than one sexual partner or your partner has more than one partner -heart disease -history of an ectopic or tubal pregnancy -immune system problems -IUD in place -liver disease or tumor -problems with blood clots or take blood-thinners -seizures -use intravenous drugs -uterus of unusual shape -vaginal bleeding that has not been explained -an unusual or allergic reaction to levonorgestrel, other  hormones, silicone, or polyethylene, medicines, foods, dyes, or preservatives -pregnant or trying to get pregnant -breast-feeding How should I use this medicine? This device is placed inside the uterus by a health care professional. Talk to your pediatrician regarding the use of this medicine in children. Special care may be needed. Overdosage: If you think you have taken too much of this medicine contact a poison control center or emergency room at once. NOTE: This medicine is only for you. Do not share this medicine with others. What if I miss a dose? This does not apply. Depending on the brand of device you have inserted, the device will need to be replaced every 3 to 5 years if you wish to continue using this type of birth control. What may interact with this medicine? Do not take this medicine with any of the following medications: -amprenavir -bosentan -fosamprenavir This medicine may also interact with the following medications: -aprepitant -armodafinil -barbiturate medicines for inducing sleep or treating seizures -bexarotene -boceprevir -griseofulvin -medicines to treat seizures like carbamazepine, ethotoin, felbamate, oxcarbazepine, phenytoin, topiramate -modafinil -pioglitazone -rifabutin -rifampin -rifapentine -some medicines to treat HIV infection like atazanavir, efavirenz, indinavir, lopinavir, nelfinavir, tipranavir, ritonavir -St. John's wort -warfarin This list may not describe all possible interactions. Give your health care provider a list of all the medicines, herbs, non-prescription drugs, or dietary supplements you use. Also tell them if you smoke, drink alcohol, or use illegal drugs. Some items may interact with your medicine. What should I watch for while using this medicine? Visit your doctor or health care professional for regular check ups. See your doctor if you or your partner has sexual contact with others, becomes HIV positive, or gets a sexual  transmitted disease. This product does not protect you against HIV infection (AIDS) or other sexually transmitted diseases. You can check the placement of the IUD yourself by reaching up to the top of your vagina with clean fingers to feel the threads. Do not pull on the threads. It is a good habit to check placement after each menstrual period. Call your doctor right away if you feel more of the IUD than just the threads or if you cannot feel the threads at all. The IUD may come out by itself. You may become pregnant if the device comes out. If you notice that the IUD has come out use a backup birth control method like condoms and call your health care provider. Using tampons will not change the position of the IUD and are okay to use during your period. This IUD can be safely scanned with magnetic resonance imaging (MRI) only under specific conditions. Before you have an MRI, tell your healthcare provider that you have an IUD in place, and which type of IUD you have in place. What side effects may I notice from receiving this medicine? Side effects that you should report to your doctor or health care professional as soon as possible: -allergic reactions like skin rash, itching or hives, swelling of the face, lips, or tongue -fever, flu-like symptoms -genital sores -high blood pressure -no menstrual period for 6  weeks during use -pain, swelling, warmth in the leg -pelvic pain or tenderness -severe or sudden headache -signs of pregnancy -stomach cramping -sudden shortness of breath -trouble with balance, talking, or walking -unusual vaginal bleeding, discharge -yellowing of the eyes or skin Side effects that usually do not require medical attention (report to your doctor or health care professional if they continue or are bothersome): -acne -breast pain -change in sex drive or performance -changes in weight -cramping, dizziness, or faintness while the device is being inserted -headache  -irregular menstrual bleeding within first 3 to 6 months of use -nausea This list may not describe all possible side effects. Call your doctor for medical advice about side effects. You may report side effects to FDA at 1-800-FDA-1088. Where should I keep my medicine? This does not apply. NOTE: This sheet is a summary. It may not cover all possible information. If you have questions about this medicine, talk to your doctor, pharmacist, or health care provider.  2019 Elsevier/Gold Standard (2015-10-13 14:14:56)

## 2018-04-17 NOTE — Telephone Encounter (Signed)
Patient informed. She will come pick up samples this afternoon.

## 2018-04-17 NOTE — Telephone Encounter (Signed)
Call pt we had to do prior authorization for Jardiance so there is a wait time  She can come pick up sample Jardiance 10 mg 1 pill in the am 7 pills  Januvia 100 mg 1x per day x 2 week supply   Thanks TMS

## 2018-04-22 ENCOUNTER — Telehealth: Payer: Self-pay

## 2018-04-22 NOTE — Telephone Encounter (Signed)
Coronavirus (COVID-19) Are you at risk?  Are you at risk for the Coronavirus (COVID-19)?  To be considered HIGH RISK for Coronavirus (COVID-19), you have to meet the following criteria:  . Traveled to [place], [place], [place], [place] or [place]; or in the [place] to [place], [place], [place], or [place]; and have fever, cough, and shortness of breath within the last 2 weeks of travel OR . Been in close contact with a person diagnosed with COVID-19 within the last 2 weeks and have fever, cough, and shortness of breath . IF YOU DO NOT MEET THESE CRITERIA, YOU ARE CONSIDERED LOW RISK FOR COVID-19.  What to do if you are HIGH RISK for COVID-19?  . If you are having a medical emergency, call 911. . Seek medical care right away. Before you go to a doctor's office, urgent care or emergency department, call ahead and tell them about your recent travel, contact with someone diagnosed with COVID-19, and your symptoms. You should receive instructions from your physician's office regarding next steps of care.  . When you arrive at healthcare provider, tell the healthcare staff immediately you have returned from visiting [place], [place], [place], [place] or [place]; or traveled in the [place] to [place], [place], [place], or [place]; in the last two weeks or you have been in close contact with a person diagnosed with COVID-19 in the last 2 weeks.   . Tell the health care staff about your symptoms: fever, cough and shortness of breath. . After you have been seen by a medical provider, you will be either: o Tested for (COVID-19) and discharged home on quarantine except to seek medical care if symptoms worsen, and asked to  - Stay home and avoid contact with others until you get your results (4-5 days)  - Avoid travel on public transportation if possible (such as bus, train, or airplane) or o Sent to the Emergency Department by EMS for evaluation, COVID-19 testing, and possible  admission depending on your condition and test results.  What to do if you are LOW RISK for COVID-19?  Reduce your risk of any infection by using the same precautions used for avoiding the common cold or flu:  . Wash your hands often with soap and warm water for at least 20 seconds.  If soap and water are not readily available, use an alcohol-based hand sanitizer with at least 60% alcohol.  . If coughing or sneezing, cover your mouth and nose by coughing or sneezing into the elbow areas of your shirt or coat, into a tissue or into your sleeve (not your hands). . Avoid shaking hands with others and consider head nods or verbal greetings only. . Avoid touching your eyes, nose, or mouth with unwashed hands.  . Avoid close contact with people who are [place]. . Avoid places or events with large numbers of people in one location, like concerts or sporting events. . Carefully consider travel plans you have or are making. . If you are planning any travel outside or inside the [place], visit the CDC's Travelers' Health webpage for the latest health notices. . If you have some symptoms but not all symptoms, continue to monitor at home and seek medical attention if your symptoms worsen. . If you are having a medical emergency, call 911.  04/22/18 SCREENING NEG SLS ADDITIONAL HEALTHCARE OPTIONS FOR PATIENTS  [facility] / e-Visit: [email or web address]         [facility]: [phone]    [facility]: [phone]                   [facility]: [phone]  

## 2018-04-23 ENCOUNTER — Other Ambulatory Visit: Payer: Self-pay

## 2018-04-23 ENCOUNTER — Ambulatory Visit (INDEPENDENT_AMBULATORY_CARE_PROVIDER_SITE_OTHER): Payer: 59 | Admitting: Certified Nurse Midwife

## 2018-04-23 ENCOUNTER — Encounter: Payer: Self-pay | Admitting: Certified Nurse Midwife

## 2018-04-23 VITALS — BP 116/84 | HR 90 | Ht 61.0 in | Wt 161.9 lb

## 2018-04-23 DIAGNOSIS — Z30430 Encounter for insertion of intrauterine contraceptive device: Secondary | ICD-10-CM

## 2018-04-23 NOTE — Progress Notes (Signed)
Jacqueline Padilla is a 43 y.o. year old [id number] African American female who presents for placement of a Mirena IUD for management of abnormal uterine bleeding.    BP 116/84   Pulse 90   Ht 5\' 1"  (1.549 m)   Wt 161 lb 14.4 oz (73.4 kg)   LMP 04/20/2018   BMI 30.59 kg/m    The risks and benefits of the method and placement have been thouroughly reviewed with the patient and all questions were answered.  Specifically the patient is aware of failure rate of 01/998, expulsion of the IUD and of possible perforation.  The patient is aware of irregular bleeding due to the method and understands the incidence of irregular bleeding diminishes with time.  Signed copy of informed consent in chart.   Time out was performed.  A medium plastic speculum was placed in the vagina.  The cervix was visualized, prepped using Betadine, and grasped with a single tooth tenaculum. The uterus was sounded to 7 cm.  Mirena IUD placed per manufacturer's recommendations.   The strings were trimmed to 3 cm.  The patient was given post procedure instructions, including signs and symptoms of infection and to check for the strings after each menses or each month, and refraining from intercourse or anything in the vagina for 3 days.  She was given a Mirena care card with date Mirena placed, and date Mirena to be removed.  Reviewed red flag symptoms and when to call.   RTC x 4-8 weeks for IUD string check or sooner if needed.    Gunnar Bulla, CNM [facility], [facility] 04/23/18 8:40 AM   NDC: 22297-989-21 Lot: [id number] Exp: 06/2020

## 2018-04-23 NOTE — Patient Instructions (Signed)
IUD PLACEMENT POST-PROCEDURE INSTRUCTIONS  1. You may take Ibuprofen, Aleve or Tylenol for pain if needed.  Cramping should resolve within in 24 hours.  2. You may have a small amount of spotting.  You should wear a mini pad for the next few days.  3. You may have intercourse after 24 hours.  If you using this for birth control, it is effective immediately.  4. You need to call if you have any pelvic pain, fever, heavy bleeding or foul smelling vaginal discharge.  Irregular bleeding is common the first several months after having an IUD placed. You do not need to call for this reason unless you are concerned.  5. Shower or bathe as normal  6. You should have a follow-up appointment in 4-8 weeks for a re-check to make sure you are not having any problems.  Levonorgestrel intrauterine device (IUD) What is this medicine? LEVONORGESTREL IUD (LEE voe nor jes trel) is a contraceptive (birth control) device. The device is placed inside the uterus by a healthcare professional. It is used to prevent pregnancy. This device can also be used to treat heavy bleeding that occurs during your period. This medicine may be used for other purposes; ask your health care provider or pharmacist if you have questions. COMMON BRAND NAME(S): Kyleena, LILETTA, Mirena, Skyla What should I tell my health care provider before I take this medicine? They need to know if you have any of these conditions: -abnormal Pap smear -cancer of the breast, uterus, or cervix -diabetes -endometritis -genital or pelvic infection now or in the past -have more than one sexual partner or your partner has more than one partner -heart disease -history of an ectopic or tubal pregnancy -immune system problems -IUD in place -liver disease or tumor -problems with blood clots or take blood-thinners -seizures -use intravenous drugs -uterus of unusual shape -vaginal bleeding that has not been explained -an unusual or allergic reaction  to levonorgestrel, other hormones, silicone, or polyethylene, medicines, foods, dyes, or preservatives -pregnant or trying to get pregnant -breast-feeding How should I use this medicine? This device is placed inside the uterus by a health care professional. Talk to your pediatrician regarding the use of this medicine in children. Special care may be needed. Overdosage: If you think you have taken too much of this medicine contact a poison control center or emergency room at once. NOTE: This medicine is only for you. Do not share this medicine with others. What if I miss a dose? This does not apply. Depending on the brand of device you have inserted, the device will need to be replaced every 3 to 5 years if you wish to continue using this type of birth control. What may interact with this medicine? Do not take this medicine with any of the following medications: -amprenavir -bosentan -fosamprenavir This medicine may also interact with the following medications: -aprepitant -armodafinil -barbiturate medicines for inducing sleep or treating seizures -bexarotene -boceprevir -griseofulvin -medicines to treat seizures like carbamazepine, ethotoin, felbamate, oxcarbazepine, phenytoin, topiramate -modafinil -pioglitazone -rifabutin -rifampin -rifapentine -some medicines to treat HIV infection like atazanavir, efavirenz, indinavir, lopinavir, nelfinavir, tipranavir, ritonavir -St. John's wort -warfarin This list may not describe all possible interactions. Give your health care provider a list of all the medicines, herbs, non-prescription drugs, or dietary supplements you use. Also tell them if you smoke, drink alcohol, or use illegal drugs. Some items may interact with your medicine. What should I watch for while using this medicine? Visit your doctor or health care   professional for regular check ups. See your doctor if you or your partner has sexual contact with others, becomes HIV positive,  or gets a sexual transmitted disease. This product does not protect you against HIV infection (AIDS) or other sexually transmitted diseases. You can check the placement of the IUD yourself by reaching up to the top of your vagina with clean fingers to feel the threads. Do not pull on the threads. It is a good habit to check placement after each menstrual period. Call your doctor right away if you feel more of the IUD than just the threads or if you cannot feel the threads at all. The IUD may come out by itself. You may become pregnant if the device comes out. If you notice that the IUD has come out use a backup birth control method like condoms and call your health care provider. Using tampons will not change the position of the IUD and are okay to use during your period. This IUD can be safely scanned with magnetic resonance imaging (MRI) only under specific conditions. Before you have an MRI, tell your healthcare provider that you have an IUD in place, and which type of IUD you have in place. What side effects may I notice from receiving this medicine? Side effects that you should report to your doctor or health care professional as soon as possible: -allergic reactions like skin rash, itching or hives, swelling of the face, lips, or tongue -fever, flu-like symptoms -genital sores -high blood pressure -no menstrual period for 6 weeks during use -pain, swelling, warmth in the leg -pelvic pain or tenderness -severe or sudden headache -signs of pregnancy -stomach cramping -sudden shortness of breath -trouble with balance, talking, or walking -unusual vaginal bleeding, discharge -yellowing of the eyes or skin Side effects that usually do not require medical attention (report to your doctor or health care professional if they continue or are bothersome): -acne -breast pain -change in sex drive or performance -changes in weight -cramping, dizziness, or faintness while the device is being  inserted -headache -irregular menstrual bleeding within first 3 to 6 months of use -nausea This list may not describe all possible side effects. Call your doctor for medical advice about side effects. You may report side effects to FDA at 1-800-FDA-1088. Where should I keep my medicine? This does not apply. NOTE: This sheet is a summary. It may not cover all possible information. If you have questions about this medicine, talk to your doctor, pharmacist, or health care provider.  2019 Elsevier/Gold Standard (2015-10-13 14:14:56)  

## 2018-04-29 ENCOUNTER — Encounter: Payer: Self-pay | Admitting: Certified Nurse Midwife

## 2018-04-29 ENCOUNTER — Telehealth: Payer: Self-pay | Admitting: Dietician

## 2018-04-29 DIAGNOSIS — E785 Hyperlipidemia, unspecified: Secondary | ICD-10-CM | POA: Diagnosis not present

## 2018-04-29 DIAGNOSIS — Z794 Long term (current) use of insulin: Secondary | ICD-10-CM | POA: Diagnosis not present

## 2018-04-29 DIAGNOSIS — E1165 Type 2 diabetes mellitus with hyperglycemia: Secondary | ICD-10-CM | POA: Diagnosis not present

## 2018-04-29 DIAGNOSIS — E1169 Type 2 diabetes mellitus with other specified complication: Secondary | ICD-10-CM | POA: Diagnosis not present

## 2018-04-29 NOTE — Telephone Encounter (Signed)
Called patient to determine her preference for attending diabetes class 1 scheduled for 05/04/18. Requested a call back.

## 2018-04-30 NOTE — Telephone Encounter (Signed)
Patient returned message; she does plan to keep her appointment for class 1 at 5:30pm on 05/04/18. Returned her phone call to confirm.

## 2018-05-04 ENCOUNTER — Encounter: Payer: 59 | Attending: Internal Medicine | Admitting: Dietician

## 2018-05-04 ENCOUNTER — Encounter: Payer: Self-pay | Admitting: Dietician

## 2018-05-04 ENCOUNTER — Other Ambulatory Visit: Payer: Self-pay

## 2018-05-04 VITALS — Ht 60.0 in | Wt 163.2 lb

## 2018-05-04 DIAGNOSIS — Z713 Dietary counseling and surveillance: Secondary | ICD-10-CM | POA: Insufficient documentation

## 2018-05-04 DIAGNOSIS — Z6831 Body mass index (BMI) 31.0-31.9, adult: Secondary | ICD-10-CM | POA: Diagnosis not present

## 2018-05-04 DIAGNOSIS — E119 Type 2 diabetes mellitus without complications: Secondary | ICD-10-CM | POA: Diagnosis not present

## 2018-05-04 NOTE — Progress Notes (Signed)

## 2018-05-07 ENCOUNTER — Other Ambulatory Visit: Payer: Self-pay

## 2018-05-11 ENCOUNTER — Encounter: Payer: Self-pay | Admitting: Dietician

## 2018-05-11 ENCOUNTER — Other Ambulatory Visit: Payer: Self-pay

## 2018-05-11 NOTE — Progress Notes (Signed)
Patient cancelled her attendance at class 2 for today. She will come 05/18/18 for class 3 and reschedule class 2 at that time.

## 2018-05-18 ENCOUNTER — Other Ambulatory Visit: Payer: Self-pay

## 2018-05-18 ENCOUNTER — Encounter: Payer: 59 | Attending: Internal Medicine | Admitting: Dietician

## 2018-05-18 ENCOUNTER — Encounter: Payer: Self-pay | Admitting: Dietician

## 2018-05-18 VITALS — BP 124/84 | Ht 60.0 in | Wt 164.6 lb

## 2018-05-18 DIAGNOSIS — Z713 Dietary counseling and surveillance: Secondary | ICD-10-CM | POA: Diagnosis not present

## 2018-05-18 DIAGNOSIS — Z6831 Body mass index (BMI) 31.0-31.9, adult: Secondary | ICD-10-CM | POA: Diagnosis not present

## 2018-05-18 DIAGNOSIS — E119 Type 2 diabetes mellitus without complications: Secondary | ICD-10-CM | POA: Insufficient documentation

## 2018-05-18 NOTE — Progress Notes (Signed)

## 2018-05-19 ENCOUNTER — Other Ambulatory Visit: Payer: Self-pay

## 2018-06-01 ENCOUNTER — Encounter: Payer: Self-pay | Admitting: Dietician

## 2018-06-01 ENCOUNTER — Encounter: Payer: 59 | Admitting: Dietician

## 2018-06-01 ENCOUNTER — Other Ambulatory Visit: Payer: Self-pay

## 2018-06-01 VITALS — Wt 161.7 lb

## 2018-06-01 DIAGNOSIS — Z6831 Body mass index (BMI) 31.0-31.9, adult: Secondary | ICD-10-CM | POA: Diagnosis not present

## 2018-06-01 DIAGNOSIS — Z713 Dietary counseling and surveillance: Secondary | ICD-10-CM | POA: Diagnosis not present

## 2018-06-01 DIAGNOSIS — E119 Type 2 diabetes mellitus without complications: Secondary | ICD-10-CM | POA: Diagnosis not present

## 2018-06-01 NOTE — Progress Notes (Signed)

## 2018-06-05 ENCOUNTER — Other Ambulatory Visit: Payer: Self-pay

## 2018-06-10 ENCOUNTER — Encounter: Payer: Self-pay | Admitting: Certified Nurse Midwife

## 2018-06-11 ENCOUNTER — Encounter: Payer: 59 | Admitting: Certified Nurse Midwife

## 2018-06-12 ENCOUNTER — Encounter: Payer: Self-pay | Admitting: Dietician

## 2018-06-12 NOTE — Progress Notes (Signed)
Sent letter of completion to referring provider.

## 2018-06-18 ENCOUNTER — Encounter: Payer: Self-pay | Admitting: Internal Medicine

## 2018-06-18 ENCOUNTER — Other Ambulatory Visit: Payer: Self-pay | Admitting: Internal Medicine

## 2018-06-18 DIAGNOSIS — E119 Type 2 diabetes mellitus without complications: Secondary | ICD-10-CM

## 2018-06-18 MED ORDER — INSULIN DEGLUDEC 100 UNIT/ML ~~LOC~~ SOPN: 10.0000 [IU] | PEN_INJECTOR | Freq: Every day | SUBCUTANEOUS | Status: DC

## 2018-06-19 ENCOUNTER — Encounter: Payer: Self-pay | Admitting: Internal Medicine

## 2018-06-22 ENCOUNTER — Other Ambulatory Visit: Payer: Self-pay | Admitting: Family

## 2018-06-22 ENCOUNTER — Encounter: Payer: Self-pay | Admitting: Certified Nurse Midwife

## 2018-06-22 ENCOUNTER — Encounter: Payer: 59 | Admitting: Certified Nurse Midwife

## 2018-06-22 DIAGNOSIS — M62838 Other muscle spasm: Secondary | ICD-10-CM

## 2018-06-23 ENCOUNTER — Encounter: Payer: Self-pay | Admitting: Certified Nurse Midwife

## 2018-06-24 ENCOUNTER — Other Ambulatory Visit: Payer: Self-pay | Admitting: Internal Medicine

## 2018-06-24 ENCOUNTER — Encounter: Payer: Self-pay | Admitting: Internal Medicine

## 2018-06-25 ENCOUNTER — Encounter: Payer: Self-pay | Admitting: Internal Medicine

## 2018-07-13 ENCOUNTER — Encounter: Payer: Self-pay | Admitting: Internal Medicine

## 2018-08-04 ENCOUNTER — Encounter: Payer: Self-pay | Admitting: Internal Medicine

## 2018-08-27 DIAGNOSIS — E1169 Type 2 diabetes mellitus with other specified complication: Secondary | ICD-10-CM | POA: Diagnosis not present

## 2018-08-27 DIAGNOSIS — E1165 Type 2 diabetes mellitus with hyperglycemia: Secondary | ICD-10-CM | POA: Diagnosis not present

## 2018-08-27 DIAGNOSIS — E559 Vitamin D deficiency, unspecified: Secondary | ICD-10-CM | POA: Diagnosis not present

## 2018-08-27 DIAGNOSIS — Z794 Long term (current) use of insulin: Secondary | ICD-10-CM | POA: Diagnosis not present

## 2018-09-09 ENCOUNTER — Encounter: Payer: 59 | Admitting: Internal Medicine

## 2018-09-15 ENCOUNTER — Other Ambulatory Visit: Payer: Self-pay | Admitting: Internal Medicine

## 2018-09-15 ENCOUNTER — Other Ambulatory Visit: Payer: Self-pay

## 2018-09-15 DIAGNOSIS — G4700 Insomnia, unspecified: Secondary | ICD-10-CM

## 2018-09-15 MED ORDER — TEMAZEPAM 30 MG PO CAPS: 30.0000 mg | ORAL_CAPSULE | Freq: Every evening | ORAL | 5 refills | Status: DC | PRN

## 2018-09-17 ENCOUNTER — Other Ambulatory Visit: Payer: Self-pay

## 2018-09-17 ENCOUNTER — Ambulatory Visit (INDEPENDENT_AMBULATORY_CARE_PROVIDER_SITE_OTHER): Payer: 59 | Admitting: Internal Medicine

## 2018-09-17 VITALS — Ht 60.0 in | Wt 154.0 lb

## 2018-09-17 DIAGNOSIS — Z1231 Encounter for screening mammogram for malignant neoplasm of breast: Secondary | ICD-10-CM | POA: Diagnosis not present

## 2018-09-17 DIAGNOSIS — Z0001 Encounter for general adult medical examination with abnormal findings: Secondary | ICD-10-CM | POA: Diagnosis not present

## 2018-09-17 DIAGNOSIS — Z30431 Encounter for routine checking of intrauterine contraceptive device: Secondary | ICD-10-CM | POA: Diagnosis not present

## 2018-09-17 DIAGNOSIS — F411 Generalized anxiety disorder: Secondary | ICD-10-CM

## 2018-09-17 DIAGNOSIS — L659 Nonscarring hair loss, unspecified: Secondary | ICD-10-CM | POA: Diagnosis not present

## 2018-09-17 DIAGNOSIS — Z124 Encounter for screening for malignant neoplasm of cervix: Secondary | ICD-10-CM | POA: Diagnosis not present

## 2018-09-17 DIAGNOSIS — R319 Hematuria, unspecified: Secondary | ICD-10-CM

## 2018-09-17 DIAGNOSIS — D509 Iron deficiency anemia, unspecified: Secondary | ICD-10-CM

## 2018-09-17 DIAGNOSIS — N938 Other specified abnormal uterine and vaginal bleeding: Secondary | ICD-10-CM | POA: Diagnosis not present

## 2018-09-17 DIAGNOSIS — E119 Type 2 diabetes mellitus without complications: Secondary | ICD-10-CM | POA: Diagnosis not present

## 2018-09-17 DIAGNOSIS — G4700 Insomnia, unspecified: Secondary | ICD-10-CM | POA: Diagnosis not present

## 2018-09-17 DIAGNOSIS — E559 Vitamin D deficiency, unspecified: Secondary | ICD-10-CM

## 2018-09-17 DIAGNOSIS — Z0000 Encounter for general adult medical examination without abnormal findings: Secondary | ICD-10-CM

## 2018-09-17 MED ORDER — SERTRALINE HCL 100 MG PO TABS: 100.0000 mg | ORAL_TABLET | Freq: Every day | ORAL | 3 refills | Status: DC

## 2018-09-17 NOTE — Progress Notes (Signed)
Virtual Visit via Video Note  I connected with Jacqueline Padilla  on 09/18/18 at  1:10 PM EDT by a video enabled telemedicine application and verified that I am speaking with the correct person using two identifiers.  Location patient: home Location provider:work or home office Persons participating in the virtual visit: patient, provider, pts husband   I discussed the limitations of evaluation and management by telemedicine and the availability of in person appointments. The patient expressed understanding and agreed to proceed.   HPI: 1. DM 2 A1C 5.7 08/27/18 from 10.2 [facility] on metformin 500 mg bid and ozempic 0.5 Q sat with zofran due to nausea. She was stopped on tresiba 10 units due to improvement in A1C and not taking Jardiance  cbg in the am 96 to M 130 and check also checks 2 hrs after dinner she has had some cbgs in the 70s but not less than 70 she thinks but has felt like low cbgs have woken her up at night   GAD7 antibodies negative 04/17/18   appt with My eye doc 09/2018 DM eye exam   Foot exam done 04/17/2018 Dr. Hoy Finlay  Bilateral feet were examined with socks and shoes removed. Her 10g filament is normal bilaterally. She has no ulcers. Distal pulses intact. 2. C/o increased anxiety on zoloft 75 mg qd restoril 30 mg qhs is helping her get a good sleep. She tries breathing techniques but finds herself pacing at work and home due to anxiety   She is agreeable to therapy referral   3. DUB due to fibroids f/u Dr. Verdene Rio has Mirena IUD which she needs to get checked and no more DUB she will call and schedule f/u  And she is also due for pap   4. Iron def anemia she is taking iron qod due to constipation   5.c/o hair loss and hair thinning notes FH dad is bald and aunt has same grade of hair and her hair is also thinning she does not chemically process she is taking biotin but we also disc banyan hair oil with amla and bhringaraj today otc    6. Annual    ROS: See  pertinent positives and negatives per HPI. General: down 10 lbs  HEENT: no sore throat  CV: no chest pain  Lungs: no sob  GI: no ab pain  GU: DUB improved  MSK: no joint pain  Skin: +hair loss/thinning  Neuro: no h/a  Psych: increased anxiety    Past Medical History:  Diagnosis Date  . Anemia   . Anxiety   . Depression   . Diabetes mellitus without complication ([facility])   . Herpes    1/2 +  . History of chicken pox   . History of urinary tract infection   . Insomnia   . Iron deficiency   . Vitamin D deficiency     Past Surgical History:  Procedure Laterality Date  . DILATION AND CURETTAGE OF UTERUS  2001   s/p miscarriage  . ENDOMETRIAL ABLATION  2006   hx of abnormal pap  . TONSILLECTOMY AND ADENOIDECTOMY  1994  . TUBAL LIGATION  2006    Family History  Problem Relation Age of Onset  . Diabetes Mother   . Hypertension Mother   . Hypertension Father   . Heart disease Father        CABG  . Cancer Maternal Grandfather        Lung cancer - non smoker  . Breast cancer Paternal Grandmother   .  Bipolar disorder Sister   . Stroke Maternal Grandmother   . Bipolar disorder Cousin   . Ovarian cancer Neg Hx   . Colon cancer Neg Hx     SOCIAL HX:   Jacqueline Padilla grew up in [place], [place]. She lives in [place] with her Husband and their son. Her husband has 3 daughters that live with them as well. She has 32 y.o son as of 06/2017. 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 She enjoys spending time with the family, outdoor activities, shopping. Married x 16 years   Exercise - not currently Caffeine - 1 cup daily  Current Outpatient Medications:  .  albuterol (PROVENTIL HFA;VENTOLIN HFA) 108 (90 Base) MCG/ACT inhaler, Inhale 2 puffs into the lungs every 6 (six) hours as needed for wheezing or shortness of breath., Disp: 1 Inhaler, Rfl: 0 .  Biotin 1 MG CAPS, Take by mouth., Disp: ,  Rfl:  .  blood glucose meter kit and supplies, Dispense based on patient and insurance preference. Use up to three times daily as directed. ( E11.9). one touch, Disp: 1 each, Rfl: 0 .  Blood Glucose Monitoring Suppl (FREESTYLE LITE) DEVI, , Disp: , Rfl:  .  Cholecalciferol (VITAMIN D) 125 MCG (5000 UT) CAPS, Take by mouth., Disp: , Rfl:  .  cyclobenzaprine (FLEXERIL) 5 MG tablet, TAKE 1 TABLET (5 MG TOTAL) BY MOUTH AT BEDTIME AS NEEDED FOR MUSCLE SPASMS., Disp: 30 tablet, Rfl: 2 .  empagliflozin (JARDIANCE) 10 MG TABS tablet, Take 10 mg by mouth daily. In am, Disp: 30 tablet, Rfl: 0 .  ferrous sulfate 325 (65 FE) MG EC tablet, Take 325 mg by mouth once a week., Disp: , Rfl:  .  FREESTYLE LITE test strip, , Disp: , Rfl:  .  Insulin Pen Needle (FIFTY50 PEN NEEDLES) 32G X 4 MM MISC, Use 1 each once daily, Disp: , Rfl:  .  Lancets (FREESTYLE) lancets, , Disp: , Rfl:  .  metFORMIN (GLUCOPHAGE) 500 MG tablet, Take 500 mg by mouth 2 (two) times a day., Disp: , Rfl:  .  ondansetron (ZOFRAN) 4 MG tablet, Take 4 mg by mouth once a week. With ozempic on Saturday, Disp: , Rfl:  .  Semaglutide,0.25 or 0.5MG/DOS, 2 MG/1.5ML SOPN, Inject 0.5 mg into the skin once a week., Disp: , Rfl:  .  sertraline (ZOLOFT) 100 MG tablet, Take 1 tablet (100 mg total) by mouth daily., Disp: 90 tablet, Rfl: 3 .  temazepam (RESTORIL) 30 MG capsule, Take 1 capsule (30 mg total) by mouth at bedtime as needed for sleep. 1 hour before bed, Disp: 30 capsule, Rfl: 5  EXAM:  VITALS per patient if applicable:  GENERAL: alert, oriented, appears well and in no acute distress  HEENT: atraumatic, conjunttiva clear, no obvious abnormalities on inspection of external nose and ears  NECK: normal movements of the head and neck  LUNGS: on inspection no signs of respiratory distress, breathing rate appears normal, no obvious gross SOB, gasping or wheezing  CV: no obvious cyanosis  MS: moves all visible extremities without noticeable  abnormality  PSYCH/NEURO: pleasant and cooperative, no obvious depression or anxiety, speech and thought processing grossly intact  ASSESSMENT AND PLAN:  Discussed the following assessment and plan:  Annual physical exam Fasting labs at hospital orders in as of 09/17/2018  Flu shot had 9/11/19will get at work  -consider pna 23 vaccine in future  Tdap haddue at f/u  MMR immune  Hep B immune  Pap at will let ob/gyn do last 06/01/15 neg neg hpv  -will rec OB/GYN do pap [facility] and due for IUD check IUD placed 04/23/18 Mackie Pai 04/23/2018 referral placed    mammo7/10/19 negativeordered new mammo pt to call and schedule   rec smoking cessation Healthy diet and exercise   GAD (generalized anxiety disorder) - Plan: sertraline (ZOLOFT) 100 MG tablet increased from 75 mg qd, Ambulatory referral to Psychology Alene Mires Also consider L theanine capsules 100-200 mg daily otc for anxiety   Insomnia-cont restoril   Hair loss-disc biotin she is taking and disc otc hair oil. She is not chemically processing may be 2/2 stress vs genetic   DUB (dysfunctional uterine bleeding) improved   Type 2 diabetes mellitus without complication, without long-term current use of insulin (HCC) -cont ozempic 0.5 Q sat with zofran  Metformin 500 mg bid  appt with My eye doc 09/2018 DM eye exam   Foot exam done 04/17/2018 Dr. Hoy Finlay  Bilateral feet were examined with socks and shoes removed. Her 10g filament is normal bilaterally. She has no ulcers. Distal pulses intact.    Iron deficiency anemia, unspecified iron deficiency anemia type Taking iron qod   Vitamin D deficiency rec D3 5000 IU qd   Of note pts husband Nashalie Sallis on visit today with bald spot in scalp w/o hair growth at all and wants to know what is causing this  -rec he schedule with PCP could be Alopecia areata other things to consider would be consider check RPR but pts checked 06/19/17 negative and in 2017 so less likely   -treatments could be topical steroids I.e clobetasol/betamethasone 0.05 ointment x 2-6 weeks topically    Or refer to dermatology for intralesional injection of steroids     I discussed the assessment and treatment plan with the patient. The patient was provided an opportunity to ask questions and all were answered. The patient agreed with the plan and demonstrated an understanding of the instructions.   The patient was advised to call back or seek an in-person evaluation if the symptoms worsen or if the condition fails to improve as anticipated.  Time spent 20 minutes  Delorise Jackson, MD

## 2018-09-18 ENCOUNTER — Encounter: Payer: Self-pay | Admitting: Internal Medicine

## 2018-09-18 NOTE — Patient Instructions (Addendum)
Hypoglycemia  Hypoglycemia occurs when the level of sugar (glucose) in the blood is too low. Hypoglycemia can happen in people who do or do not have diabetes. It can develop quickly, and it can be a medical emergency. For most people with diabetes, a blood glucose level below 70 mg/dL (3.9 mmol/L) is considered hypoglycemia.  Glucose is a type of sugar that provides the body's main source of energy. Certain hormones (insulin and glucagon) control the level of glucose in the blood. Insulin lowers blood glucose, and glucagon raises blood glucose. Hypoglycemia can result from having too much insulin in the bloodstream, or from not eating enough food that contains glucose. You may also have reactive hypoglycemia, which happens within 4 hours after eating a meal.  What are the causes?  Hypoglycemia occurs most often in people who have diabetes and may be caused by:   Diabetes medicine.   Not eating enough, or not eating often enough.   Increased physical activity.   Drinking alcohol on an empty stomach.  If you do not have diabetes, hypoglycemia may be caused by:   A tumor in the pancreas.   Not eating enough, or not eating for long periods at a time (fasting).   A severe infection or illness.   Certain medicines.  What increases the risk?  Hypoglycemia is more likely to develop in:   People who have diabetes and take medicines to lower blood glucose.   People who abuse alcohol.   People who have a severe illness.  What are the signs or symptoms?  Mild symptoms  Mild hypoglycemia may not cause any symptoms. If you do have symptoms, they may include:   Hunger.   Anxiety.   Sweating and feeling clammy.   Dizziness or feeling light-headed.   Sleepiness.   Nausea.   Increased heart rate.   Headache.   Blurry vision.   Irritability.   Tingling or numbness around the mouth, lips, or tongue.   A change in coordination.   Restless sleep.  Moderate symptoms  Moderate hypoglycemia can cause:   Mental  confusion and poor judgment.   Behavior changes.   Weakness.   Irregular heartbeat.  Severe symptoms  Severe hypoglycemia is a medical emergency. It can cause:   Fainting.   Seizures.   Loss of consciousness (coma).   Death.  How is this diagnosed?  Hypoglycemia is diagnosed with a blood test to measure your blood glucose level. This blood test is done while you are having symptoms. Your health care provider may also do a physical exam and review your medical history.  How is this treated?  This condition can often be treated by immediately eating or drinking something that contains sugar, such as:   Fruit juice, 4-6 oz (120-150 mL).   Regular soda (not diet soda), 4-6 oz (120-150 mL).   Low-fat milk, 4 oz (120 mL).   Several pieces of hard candy.   Sugar or honey, 1 Tbsp (15 mL).  Treating hypoglycemia if you have diabetes  If you are alert and able to swallow safely, follow the 15:15 rule:   Take 15 grams of a rapid-acting carbohydrate. Talk with your health care provider about how much you should take.   Rapid-acting options include:  ? Glucose pills (take 15 grams).  ? 6-8 pieces of hard candy.  ? 4-6 oz (120-150 mL) of fruit juice.  ? 4-6 oz (120-150 mL) of regular (not diet) soda.  ? 1 Tbsp (15 mL) honey or   to normal, eat a meal or a snack within 1 hour.  Treating severe hypoglycemia Severe hypoglycemia is when your blood glucose level is at or below 54 mg/dL (3 mmol/L). Severe hypoglycemia is a medical emergency. Get medical help right away. If you have severe hypoglycemia and you cannot eat or drink, you may need an injection of  glucagon. A family member or close friend should learn how to check your blood glucose and how to give you a glucagon injection. Ask your health care provider if you need to have an emergency glucagon injection kit available. Severe hypoglycemia may need to be treated in a hospital. The treatment may include getting glucose through an IV. You may also need treatment for the cause of your hypoglycemia. Follow these instructions at home:  General instructions  Take over-the-counter and prescription medicines only as told by your health care provider.  Monitor your blood glucose as told by your health care provider.  Limit alcohol intake to no more than 1 drink a day for nonpregnant women and 2 drinks a day for men. One drink equals 12 oz of beer (355 mL), 5 oz of wine (148 mL), or 1 oz of hard liquor (44 mL).  Keep all follow-up visits as told by your health care provider. This is important. If you have diabetes:  Always have a rapid-acting carbohydrate snack with you to treat low blood glucose.  Follow your diabetes management plan as directed. Make sure you: ? Know the symptoms of hypoglycemia. It is important to treat it right away to prevent it from becoming severe. ? Take your medicines as directed. ? Follow your exercise plan. ? Follow your meal plan. Eat on time, and do not skip meals. ? Check your blood glucose as often as directed. Always check before and after exercise. ? Follow your sick day plan whenever you cannot eat or drink normally. Make this plan in advance with your health care provider.  Share your diabetes management plan with people in your workplace, school, and household.  Check your urine for ketones when you are ill and as told by your health care provider.  Carry a medical alert card or wear medical alert jewelry. Contact a health care provider if:  You have problems keeping your blood glucose in your target range.  You have frequent episodes of  hypoglycemia. Get help right away if:  You continue to have hypoglycemia symptoms after eating or drinking something containing glucose.  Your blood glucose is at or below 54 mg/dL (3 mmol/L).  You have a seizure.  You faint. These symptoms may represent a serious problem that is an emergency. Do not wait to see if the symptoms will go away. Get medical help right away. Call your local emergency services (911 in the [place] Summary  Hypoglycemia occurs when the level of sugar (glucose) in the blood is too low.  Hypoglycemia can happen in people who do or do not have diabetes. It can develop quickly, and it can be a medical emergency.  Make sure you know the symptoms of hypoglycemia and how to treat it.  Always have a rapid-acting carbohydrate snack with you to treat low blood sugar. This information is not intended to replace advice given to you by your health care provider. Make sure you discuss any questions you have with your health care provider. Document Released: 12/31/2004 Document Revised: 06/23/2017 Document Reviewed: 02/03/2015 Elsevier Patient Education  [place].   Alopecia Areata, Adult  Alopecia areata is  a condition that causes you to lose hair. You may lose hair on your scalp in patches. In some cases, you may lose all the hair on your scalp (alopecia totalis) or all the hair from your face and body (alopecia universalis). Alopecia areata is an autoimmune disease. This means that your body's defense system (immune system) mistakes normal parts of the body for germs or other things that can make you sick. When you have alopecia areata, the immune system attacks the hair follicles. Alopecia areata usually develops in childhood, but it can develop at any age. For some people, their hair grows back on its own and hair loss does not happen again. For others, their hair may fall out and grow back in cycles. The hair loss may last many years. Having this condition can  be emotionally difficult, but it is not dangerous. What are the causes? The cause of this condition is not known. What increases the risk? This condition is more likely to develop in people who have:  A family history of alopecia.  A family history of another autoimmune disease, including type 1 diabetes and rheumatoid arthritis.  Asthma and allergies.  Down syndrome. What are the signs or symptoms? Round spots of patchy hair loss on the scalp is the main symptom of this condition. The spots may be mildly itchy. Other symptoms include:  Short dark hairs in the bald patches that are wider at the top (exclamation point hairs).  Dents, white spots, or lines in the fingernails or toenails.  Balding and body hair loss. This is rare. How is this diagnosed? This condition is diagnosed based on your symptoms and family history. Your health care provider will also check your scalp skin, teeth, and nails. Your health care provider may refer you to a specialist in hair and skin disorders (dermatologist). You may also have tests, including:  A hair pull test.  Blood tests or other screening tests to check for autoimmune diseases, such as thyroid disease or diabetes.  Skin biopsy to confirm the diagnosis.  A procedure to examine the skin with a lighted magnifying instrument (dermoscopy). How is this treated? There is no cure for alopecia areata. Treatment is aimed at promoting the regrowth of hair and preventing the immune system from overreacting. No single treatment is right for all people with alopecia areata. It depends on the type of hair loss you have and how severe it is. Work with your health care provider to find the best treatment for you. Treatment may include:  Having regular checkups to make sure the condition is not getting worse (watchful waiting).  Steroid creams or pills for 6-8 weeks to stop the immune reaction and help hair to regrow more quickly.  Other topical medicines  to alter the immune system response and support the hair growth cycle.  Steroid injections.  Therapy and counseling with a support group or therapist if you are having trouble coping with hair loss. Follow these instructions at home:  Learn as much as you can about your condition.  Apply topical creams only as told by your health care provider.  Take over-the-counter and prescription medicines only as told by your health care provider.  Consider getting a wig or products to make hair look fuller or to cover bald spots, if you feel uncomfortable with your appearance.  Get therapy or counseling if you are having a hard time coping with hair loss. Ask your health care provider to recommend a counselor or support group.  Keep all follow-up visits as told by your health care provider. This is important. Contact a health care provider if:  Your hair loss gets worse, even with treatment.  You have new symptoms.  You are struggling emotionally. Summary  Alopecia areata is an autoimmune condition that makes your body's defense system (immune system) attack the hair follicles. This causes you to lose hair.  Treatments may include regular checkups to make sure that the condition is not getting worse (watchful waiting), medicines, and steroid injections. This information is not intended to replace advice given to you by your health care provider. Make sure you discuss any questions you have with your health care provider. Document Released: 08/05/2003 Document Revised: 12/13/2016 Document Reviewed: 01/19/2016 Elsevier Patient Education  2020 [employer].

## 2018-10-02 ENCOUNTER — Encounter: Payer: 59 | Admitting: Certified Nurse Midwife

## 2018-10-15 ENCOUNTER — Encounter: Payer: Self-pay | Admitting: Internal Medicine

## 2018-10-15 ENCOUNTER — Ambulatory Visit (INDEPENDENT_AMBULATORY_CARE_PROVIDER_SITE_OTHER): Payer: 59 | Admitting: Psychology

## 2018-10-15 DIAGNOSIS — F411 Generalized anxiety disorder: Secondary | ICD-10-CM

## 2018-10-20 ENCOUNTER — Encounter: Payer: Self-pay | Admitting: Internal Medicine

## 2018-10-22 ENCOUNTER — Encounter: Payer: 59 | Admitting: Certified Nurse Midwife

## 2018-10-26 ENCOUNTER — Other Ambulatory Visit: Payer: Self-pay | Admitting: Internal Medicine

## 2018-10-26 DIAGNOSIS — F411 Generalized anxiety disorder: Secondary | ICD-10-CM

## 2018-10-26 MED ORDER — SERTRALINE HCL 100 MG PO TABS: 150.0000 mg | ORAL_TABLET | Freq: Every day | ORAL | 3 refills | Status: DC

## 2018-10-26 MED ORDER — ALPRAZOLAM 0.25 MG PO TABS: 0.2500 mg | ORAL_TABLET | Freq: Two times a day (BID) | ORAL | 2 refills | Status: DC | PRN

## 2018-10-29 ENCOUNTER — Ambulatory Visit: Payer: 59 | Admitting: Psychology

## 2018-10-29 ENCOUNTER — Encounter: Payer: 59 | Admitting: Certified Nurse Midwife

## 2018-11-04 ENCOUNTER — Other Ambulatory Visit: Payer: Self-pay | Admitting: Internal Medicine

## 2018-11-04 ENCOUNTER — Encounter: Payer: Self-pay | Admitting: Internal Medicine

## 2018-11-04 DIAGNOSIS — G4700 Insomnia, unspecified: Secondary | ICD-10-CM

## 2018-11-04 MED ORDER — TEMAZEPAM 15 MG PO CAPS: 15.0000 mg | ORAL_CAPSULE | Freq: Every evening | ORAL | 2 refills | Status: DC | PRN

## 2018-12-03 ENCOUNTER — Other Ambulatory Visit: Payer: Self-pay

## 2018-12-03 ENCOUNTER — Encounter: Payer: Self-pay | Admitting: Internal Medicine

## 2018-12-03 ENCOUNTER — Ambulatory Visit (INDEPENDENT_AMBULATORY_CARE_PROVIDER_SITE_OTHER): Payer: 59 | Admitting: Internal Medicine

## 2018-12-03 VITALS — BP 126/80 | HR 93 | Temp 97.3°F | Ht 60.0 in | Wt 152.8 lb

## 2018-12-03 DIAGNOSIS — M79605 Pain in left leg: Secondary | ICD-10-CM

## 2018-12-03 DIAGNOSIS — M79604 Pain in right leg: Secondary | ICD-10-CM | POA: Diagnosis not present

## 2018-12-03 DIAGNOSIS — E611 Iron deficiency: Secondary | ICD-10-CM

## 2018-12-03 DIAGNOSIS — E119 Type 2 diabetes mellitus without complications: Secondary | ICD-10-CM | POA: Diagnosis not present

## 2018-12-03 DIAGNOSIS — Z23 Encounter for immunization: Secondary | ICD-10-CM

## 2018-12-03 DIAGNOSIS — R0602 Shortness of breath: Secondary | ICD-10-CM

## 2018-12-03 LAB — D-DIMER, QUANTITATIVE: D-Dimer, Quant: 0.59 mcg/mL FEU — ABNORMAL HIGH (ref <0.50)

## 2018-12-03 NOTE — Progress Notes (Signed)
Chief Complaint  Patient presents with  . Annual Exam   F/u  1. Sob with exertion with IUD will check  D dimer today and if elevated CTA chest and b/l [place] legs to r/o DVT/PE. She does c/o intermittent b/l calf pain.  2. Anxiety better using xanax 0.25 and taking restoril 15 mg qhs for sleep and zoloft 150 mg qd she had to reschedule appt with [facility]  3. DM2 A1C [facility] endo 5.7 08/27/2018 and had foot exam 04/17/2018 on metformin and ozempic 0.5 1x per week  appt [facility] endocrine 12/29/18  Eye exam sch 12/29/2018  Review of Systems  Constitutional: Negative for weight loss.  HENT: Negative for hearing loss.   Eyes: Negative for blurred vision.  Respiratory: Positive for shortness of breath.   Cardiovascular:       +b/l leg pain    Gastrointestinal: Negative for abdominal pain.  Musculoskeletal: Negative for falls.  Skin: Negative for rash.  Neurological: Negative for headaches.  Psychiatric/Behavioral:       +anxiety improved  Sleep improved and mood improved with increased zoloft     Past Medical History:  Diagnosis Date  . Anemia   . Anxiety   . Depression   . Diabetes mellitus without complication ([place])   . Herpes    1/2 +  . History of chicken pox   . History of urinary tract infection   . Insomnia   . Iron deficiency   . Vitamin D deficiency    Past Surgical History:  Procedure Laterality Date  . DILATION AND CURETTAGE OF UTERUS  2001   s/p miscarriage  . ENDOMETRIAL ABLATION  2006   hx of abnormal pap  . TONSILLECTOMY AND ADENOIDECTOMY  1994  . TUBAL LIGATION  2006   Family History  Problem Relation Age of Onset  . Diabetes Mother   . Hypertension Mother   . Hypertension Father   . Heart disease Father        CABG  . Cancer Maternal Grandfather        Lung cancer - non smoker  . Breast cancer Paternal Grandmother   . Bipolar disorder Sister   . Stroke Maternal Grandmother   . Bipolar disorder Cousin   . Stomach cancer Other        maternal uncle  . Ovarian  cancer Neg Hx   . Colon cancer Neg Hx    Social History   Socioeconomic History  . Marital status: Married    Spouse name: Not on file  . Number of children: 1  . Years of education: 22  . Highest education level: Not on file  Occupational History  . Occupation: [employer]    Comment: [place]  . Occupation: Associate Professor    Comment: [place]  . Financial resource strain: Not on file  . Food insecurity    Worry: Not on file    Inability: Not on file  . Transportation needs    Medical: Not on file    Non-medical: Not on file  Tobacco Use  . Smoking status: Former Smoker    Packs/day: 0.25    Years: 18.00    Pack years: 4.50    Start date: 07/14/2017  . Smokeless tobacco: Never Used  Substance and Sexual Activity  . Alcohol use: Yes    Alcohol/week: 0.0 standard drinks    Comment: Occassional glass of wine  . Drug use: No  . Sexual activity: Yes    Partners:  Male    Birth control/protection: Surgical  Lifestyle  . Physical activity    Days per week: Not on file    Minutes per session: Not on file  . Stress: Not on file  Relationships  . Social Herbalist on phone: Not on file    Gets together: Not on file    Attends religious service: Not on file    Active member of club or organization: Not on file    Attends meetings of clubs or organizations: Not on file    Relationship status: Not on file  . Intimate partner violence    Fear of current or ex partner: Not on file    Emotionally abused: Not on file    Physically abused: Not on file    Forced sexual activity: Not on file  Other Topics Concern  . Not on file  Social History Narrative   Jacqueline Padilla grew up in [place], [place]. She lives in [place] with her Husband and their son. Her husband has 3 daughters that live with them as well. She has 48 y.o son as of 06/2017. 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 She enjoys spending time with the family, outdoor activities, shopping.   Married x 16 years       Exercise - not currently   Caffeine - 1 cup daily   Current Meds  Medication Sig  . albuterol (PROVENTIL HFA;VENTOLIN HFA) 108 (90 Base) MCG/ACT inhaler Inhale 2 puffs into the lungs every 6 (six) hours as needed for wheezing or shortness of breath.  . ALPRAZolam (XANAX) 0.25 MG tablet Take 1 tablet (0.25 mg total) by mouth 2 (two) times daily as needed for anxiety.  . Biotin 1 MG CAPS Take by mouth.  . blood glucose meter kit and supplies Dispense based on patient and insurance preference. Use up to three times daily as directed. ( E11.9). one touch  . Blood Glucose Monitoring Suppl (FREESTYLE LITE) DEVI   . Cholecalciferol (VITAMIN D) 125 MCG (5000 UT) CAPS Take by mouth.  . cyclobenzaprine (FLEXERIL) 5 MG tablet TAKE 1 TABLET (5 MG TOTAL) BY MOUTH AT BEDTIME AS NEEDED FOR MUSCLE SPASMS.  . ferrous sulfate 325 (65 FE) MG EC tablet Take 325 mg by mouth once a week.  Marland Kitchen FREESTYLE LITE test strip   . Insulin Pen Needle (FIFTY50 PEN NEEDLES) 32G X 4 MM MISC Use 1 each once daily  . Lancets (FREESTYLE) lancets   . metFORMIN (GLUCOPHAGE) 500 MG tablet Take 500 mg by mouth 2 (two) times a day.  . ondansetron (ZOFRAN) 4 MG tablet Take 4 mg by mouth once a week. With ozempic on Saturday  . Semaglutide,0.25 or 0.5MG/DOS, 2 MG/1.5ML SOPN Inject 0.5 mg into the skin once a week.  . sertraline (ZOLOFT) 100 MG tablet Take 1.5 tablets (150 mg total) by mouth daily.  . temazepam (RESTORIL) 15 MG capsule Take 1 capsule (15 mg total) by mouth at bedtime as needed for sleep. 1 hour before bed  . [DISCONTINUED] empagliflozin (JARDIANCE) 10 MG TABS tablet Take 10 mg by mouth daily. In am   No Known Allergies Recent Results (from the past 2160 hour(s))  D-Dimer, Quantitative     Status: Abnormal   Collection Time: 12/03/18  3:34 PM  Result Value Ref Range   D-Dimer, Quant 0.59  (H) <0.50 mcg/mL FEU    Comment: .  The D-Dimer test is used frequently to exclude an acute PE or DVT. In patients with a low to moderate clinical risk assessment and a D-Dimer result <0.50 mcg/mL FEU, the likelihood of a PE or DVT is very low. However, a thromboembolic event should not be excluded solely on the basis of the D-Dimer level. Increased levels of D-Dimer are associated with a PE, DVT, DIC, malignancies, inflammation, sepsis, surgery, trauma, pregnancy, and advancing patient age. [Jama 2006 11:295(2):199-207] . For additional information, please refer to: http://education.questdiagnostics.com/faq/FAQ149 (This link is being provided for informational/ educational purposes only) .    Objective  Body mass index is 29.84 kg/m. Wt Readings from Last 3 Encounters:  12/03/18 152 lb 12.8 oz (69.3 kg)  09/17/18 154 lb (69.9 kg)  06/01/18 161 lb 11.2 oz (73.3 kg)   Temp Readings from Last 3 Encounters:  12/03/18 (!) 97.3 F (36.3 C) (Skin)  03/23/18 99.9 F (37.7 C) (Oral)  02/03/18 98.3 F (36.8 C) (Oral)   BP Readings from Last 3 Encounters:  12/03/18 126/80  05/18/18 124/84  04/23/18 116/84   Pulse Readings from Last 3 Encounters:  12/03/18 93  04/23/18 90  03/23/18 (!) 104    Physical Exam Vitals signs and nursing note reviewed.  Constitutional:      Appearance: Normal appearance. She is well-developed, well-groomed and overweight.     Comments: +mask on    HENT:     Head: Normocephalic and atraumatic.  Eyes:     Conjunctiva/sclera: Conjunctivae normal.     Pupils: Pupils are equal, round, and reactive to light.  Cardiovascular:     Rate and Rhythm: Normal rate and regular rhythm.     Heart sounds: Normal heart sounds. No murmur.  Pulmonary:     Effort: Pulmonary effort is normal.     Breath sounds: Normal breath sounds.  Abdominal:     General: Abdomen is flat. Bowel sounds are normal.     Tenderness: There is no abdominal tenderness.   Musculoskeletal:     Right lower leg: No edema.     Left lower leg: No edema.  Skin:    General: Skin is warm and dry.  Neurological:     General: No focal deficit present.     Mental Status: She is alert and oriented to person, place, and time. Mental status is at baseline.     Gait: Gait normal.  Psychiatric:        Attention and Perception: Attention and perception normal.        Mood and Affect: Mood and affect normal.        Speech: Speech normal.        Behavior: Behavior normal. Behavior is cooperative.        Thought Content: Thought content normal.        Cognition and Memory: Cognition and memory normal.        Judgment: Judgment normal.     Assessment  Plan  SOB (shortness of breath) with b/l leg pain r/o PE/DVT with IUD intact - Plan: D-Dimer, Quantitative CTA chest and [place] leg b/l [place]   Type 2 diabetes mellitus A1C 5.7 08/27/18 - Plan: Comprehensive metabolic panel, CBC with Differential/Platelet, Lipid panel, Urinalysis, Routine w reflex microscopic, Microalbumin / creatinine urine ratio given lab order  [facility] endo 12/29/18 appt sch  Eye 12/29/18 my eye doctor   Foot exam done 04/17/2018 endocrine office  Foot exam done 04/17/2018 Dr. Hoy Finlay  Bilateral feet were examined with socks and  shoes removed. Her 10g filament is normal bilaterally. She has no ulcers. Distal pulses intact.  Cont ozempic 0.5 mg 1x per week and metfomin 500 mg bid  Iron deficiency - Plan: Iron, TIBC and Ferritin Panel  Iron qod   GAD (generalized anxiety disorder)/insomnia - Plan: sertraline (ZOLOFT) 150 MG tablet Ambulatory referral to Psychology Alene Mires pt needs to reschedule f/u Also consider L theanine capsules 100-200 mg daily otc for anxiety  restoril 15 mg qhs  Prn xanax 0.25 prn use sparingly    HM Fasting labs at hospital orders in or [facility] endocrine lab orders given today 12/03/18  Flu shot had 09/28/2018 at work  -consider pna 23 vaccine in future   Tdap utd 12/03/18   MMR  immune  Hep B immune  Pap at will let ob/gyn do last 06/01/15 neg neg hpv -will rec OB/GYN do pap [facility] and due for IUD check IUD placed 04/23/18 Mackie Pai seen needs pap will [facility]  mammo7/10/19 negative -ordered new mammo pt to call and schedule  Colonoscopy screening age 65   Congratulated on smoking cessation  Healthy diet and exercise  Vitamin D 3 5000 IU qd   Provider: Dr. Olivia Mackie McLean-Scocuzza-Internal Medicine

## 2018-12-03 NOTE — Patient Instructions (Addendum)
Premier protein shake choc with banana  Carbohydrate Counting for Diabetes Mellitus, Adult  Carbohydrate counting is a method of keeping track of how many carbohydrates you eat. Eating carbohydrates naturally increases the amount of sugar (glucose) in the blood. Counting how many carbohydrates you eat helps keep your blood glucose within normal limits, which helps you manage your diabetes (diabetes mellitus). It is important to know how many carbohydrates you can safely have in each meal. This is different for every person. A diet and nutrition specialist (registered dietitian) can help you make a meal plan and calculate how many carbohydrates you should have at each meal and snack. Carbohydrates are found in the following foods:  Grains, such as breads and cereals.  Dried beans and soy products.  Starchy vegetables, such as potatoes, peas, and corn.  Fruit and fruit juices.  Milk and yogurt.  Sweets and snack foods, such as cake, cookies, candy, chips, and soft drinks. How do I count carbohydrates? There are two ways to count carbohydrates in food. You can use either of the methods or a combination of both. Reading "Nutrition Facts" on packaged food The "Nutrition Facts" list is included on the labels of almost all packaged foods and beverages in the [place] It includes:  The serving size.  Information about nutrients in each serving, including the grams (g) of carbohydrate per serving. To use the Nutrition Facts":  Decide how many servings you will have.  Multiply the number of servings by the number of carbohydrates per serving.  The resulting number is the total amount of carbohydrates that you will be having. Learning standard serving sizes of other foods When you eat carbohydrate foods that are not packaged or do not include "Nutrition Facts" on the label, you need to measure the servings in order to count the amount of carbohydrates:  Measure the foods that you will eat with  a food scale or measuring cup, if needed.  Decide how many standard-size servings you will eat.  Multiply the number of servings by 15. Most carbohydrate-rich foods have about 15 g of carbohydrates per serving. ? For example, if you eat 8 oz (170 g) of strawberries, you will have eaten 2 servings and 30 g of carbohydrates (2 servings x 15 g = 30 g).  For foods that have more than one food mixed, such as soups and casseroles, you must count the carbohydrates in each food that is included. The following list contains standard serving sizes of common carbohydrate-rich foods. Each of these servings has about 15 g of carbohydrates:   hamburger bun or  English muffin.   oz (15 mL) syrup.   oz (14 g) jelly.  1 slice of bread.  1 six-inch tortilla.  3 oz (85 g) cooked rice or pasta.  4 oz (113 g) cooked dried beans.  4 oz (113 g) starchy vegetable, such as peas, corn, or potatoes.  4 oz (113 g) hot cereal.  4 oz (113 g) mashed potatoes or  of a large baked potato.  4 oz (113 g) canned or frozen fruit.  4 oz (120 mL) fruit juice.  4-6 crackers.  6 chicken nuggets.  6 oz (170 g) unsweetened dry cereal.  6 oz (170 g) plain fat-free yogurt or yogurt sweetened with artificial sweeteners.  8 oz (240 mL) milk.  8 oz (170 g) fresh fruit or one small piece of fruit.  24 oz (680 g) popped popcorn. Example of carbohydrate counting Sample meal  3 oz (85 g)  chicken breast.  6 oz (170 g) brown rice.  4 oz (113 g) corn.  8 oz (240 mL) milk.  8 oz (170 g) strawberries with sugar-free whipped topping. Carbohydrate calculation 1. Identify the foods that contain carbohydrates: ? Rice. ? Corn. ? Milk. ? Strawberries. 2. Calculate how many servings you have of each food: ? 2 servings rice. ? 1 serving corn. ? 1 serving milk. ? 1 serving strawberries. 3. Multiply each number of servings by 15 g: ? 2 servings rice x 15 g = 30 g. ? 1 serving corn x 15 g = 15 g. ? 1  serving milk x 15 g = 15 g. ? 1 serving strawberries x 15 g = 15 g. 4. Add together all of the amounts to find the total grams of carbohydrates eaten: ? 30 g + 15 g + 15 g + 15 g = 75 g of carbohydrates total. Summary  Carbohydrate counting is a method of keeping track of how many carbohydrates you eat.  Eating carbohydrates naturally increases the amount of sugar (glucose) in the blood.  Counting how many carbohydrates you eat helps keep your blood glucose within normal limits, which helps you manage your diabetes.  A diet and nutrition specialist (registered dietitian) can help you make a meal plan and calculate how many carbohydrates you should have at each meal and snack. This information is not intended to replace advice given to you by your health care provider. Make sure you discuss any questions you have with your health care provider. Document Released: 12/31/2004 Document Revised: 07/25/2016 Document Reviewed: 06/14/2015 Elsevier Patient Education  2020 [employer].  Diabetes Mellitus and Exercise Exercising regularly is important for your overall health, especially when you have diabetes (diabetes mellitus). Exercising is not only about losing weight. It has many other health benefits, such as increasing muscle strength and bone density and reducing body fat and stress. This leads to improved fitness, flexibility, and endurance, all of which result in better overall health. Exercise has additional benefits for people with diabetes, including:  Reducing appetite.  Helping to lower and control blood glucose.  Lowering blood pressure.  Helping to control amounts of fatty substances (lipids) in the blood, such as cholesterol and triglycerides.  Helping the body to respond better to insulin (improving insulin sensitivity).  Reducing how much insulin the body needs.  Decreasing the risk for heart disease by: ? Lowering cholesterol and triglyceride levels. ? Increasing the  levels of good cholesterol. ? Lowering blood glucose levels. What is my activity plan? Your health care provider or certified diabetes educator can help you make a plan for the type and frequency of exercise (activity plan) that works for you. Make sure that you:  Do at least 150 minutes of moderate-intensity or vigorous-intensity exercise each week. This could be brisk walking, biking, or water aerobics. ? Do stretching and strength exercises, such as yoga or weightlifting, at least 2 times a week. ? Spread out your activity over at least 3 days of the week.  Get some form of physical activity every day. ? Do not go more than 2 days in a row without some kind of physical activity. ? Avoid being inactive for more than 30 minutes at a time. Take frequent breaks to walk or stretch.  Choose a type of exercise or activity that you enjoy, and set realistic goals.  Start slowly, and gradually increase the intensity of your exercise over time. What do I need to know about managing  my diabetes?   Check your blood glucose before and after exercising. ? If your blood glucose is 240 mg/dL (13.3 mmol/L) or higher before you exercise, check your urine for ketones. If you have ketones in your urine, do not exercise until your blood glucose returns to normal. ? If your blood glucose is 100 mg/dL (5.6 mmol/L) or lower, eat a snack containing 15-20 grams of carbohydrate. Check your blood glucose 15 minutes after the snack to make sure that your level is above 100 mg/dL (5.6 mmol/L) before you start your exercise.  Know the symptoms of low blood glucose (hypoglycemia) and how to treat it. Your risk for hypoglycemia increases during and after exercise. Common symptoms of hypoglycemia can include: ? Hunger. ? Anxiety. ? Sweating and feeling clammy. ? Confusion. ? Dizziness or feeling light-headed. ? Increased heart rate or palpitations. ? Blurry vision. ? Tingling or numbness around the mouth, lips, or  tongue. ? Tremors or shakes. ? Irritability.  Keep a rapid-acting carbohydrate snack available before, during, and after exercise to help prevent or treat hypoglycemia.  Avoid injecting insulin into areas of the body that are going to be exercised. For example, avoid injecting insulin into: ? The arms, when playing tennis. ? The legs, when jogging.  Keep records of your exercise habits. Doing this can help you and your health care provider adjust your diabetes management plan as needed. Write down: ? Food that you eat before and after you exercise. ? Blood glucose levels before and after you exercise. ? The type and amount of exercise you have done. ? When your insulin is expected to peak, if you use insulin. Avoid exercising at times when your insulin is peaking.  When you start a new exercise or activity, work with your health care provider to make sure the activity is safe for you, and to adjust your insulin, medicines, or food intake as needed.  Drink plenty of water while you exercise to prevent dehydration or heat stroke. Drink enough fluid to keep your urine clear or pale yellow. Summary  Exercising regularly is important for your overall health, especially when you have diabetes (diabetes mellitus).  Exercising has many health benefits, such as increasing muscle strength and bone density and reducing body fat and stress.  Your health care provider or certified diabetes educator can help you make a plan for the type and frequency of exercise (activity plan) that works for you.  When you start a new exercise or activity, work with your health care provider to make sure the activity is safe for you, and to adjust your insulin, medicines, or food intake as needed. This information is not intended to replace advice given to you by your health care provider. Make sure you discuss any questions you have with your health care provider. Document Released: 03/23/2003 Document Revised:  07/25/2016 Document Reviewed: 06/12/2015 Elsevier Patient Education  2020 [employer] Following a healthy eating pattern may help you to achieve and maintain a healthy body weight, reduce the risk of chronic disease, and live a long and productive life. It is important to follow a healthy eating pattern at an appropriate calorie level for your body. Your nutritional needs should be met primarily through food by choosing a variety of nutrient-rich foods. What are tips for following this plan? Reading food labels  Read labels and choose the following: ? Reduced or low sodium. ? Juices with 100% fruit juice. ? Foods with low saturated fats and high polyunsaturated  and monounsaturated fats. ? Foods with whole grains, such as whole wheat, cracked wheat, brown rice, and wild rice. ? Whole grains that are fortified with folic acid. This is recommended for women who are pregnant or who want to become pregnant.  Read labels and avoid the following: ? Foods with a lot of added sugars. These include foods that contain brown sugar, corn sweetener, corn syrup, dextrose, fructose, glucose, high-fructose corn syrup, honey, invert sugar, lactose, malt syrup, maltose, molasses, raw sugar, sucrose, trehalose, or turbinado sugar.  Do not eat more than the following amounts of added sugar per day:  6 teaspoons (25 g) for women.  9 teaspoons (38 g) for men. ? Foods that contain processed or refined starches and grains. ? Refined grain products, such as white flour, degermed cornmeal, white bread, and white rice. Shopping  Choose nutrient-rich snacks, such as vegetables, whole fruits, and nuts. Avoid high-calorie and high-sugar snacks, such as potato chips, fruit snacks, and candy.  Use oil-based dressings and spreads on foods instead of solid fats such as butter, stick margarine, or cream cheese.  Limit pre-made sauces, mixes, and "instant" products such as flavored rice, instant  noodles, and ready-made pasta.  Try more plant-protein sources, such as tofu, tempeh, black beans, edamame, lentils, nuts, and seeds.  Explore eating plans such as the Mediterranean diet or vegetarian diet. Cooking  Use oil to saut or stir-fry foods instead of solid fats such as butter, stick margarine, or lard.  Try baking, boiling, grilling, or broiling instead of frying.  Remove the fatty part of meats before cooking.  Steam vegetables in water or broth. Meal planning   At meals, imagine dividing your plate into fourths: ? One-half of your plate is fruits and vegetables. ? One-fourth of your plate is whole grains. ? One-fourth of your plate is protein, especially lean meats, poultry, eggs, tofu, beans, or nuts.  Include low-fat dairy as part of your daily diet. Lifestyle  Choose healthy options in all settings, including home, work, school, restaurants, or stores.  Prepare your food safely: ? Wash your hands after handling raw meats. ? Keep food preparation surfaces clean by regularly washing with hot, soapy water. ? Keep raw meats separate from ready-to-eat foods, such as fruits and vegetables. ? Cook seafood, meat, poultry, and eggs to the recommended internal temperature. ? Store foods at safe temperatures. In general:  Keep cold foods at 32F (4.4C) or below.  Keep hot foods at 132F (60C) or above.  Keep your freezer at [facility] (-17.8C) or below.  Foods are no longer safe to eat when they have been between the temperatures of 40-132F (4.4-60C) for more than 2 hours. What foods should I eat? Fruits Aim to eat 2 cup-equivalents of fresh, canned (in natural juice), or frozen fruits each day. Examples of 1 cup-equivalent of fruit include 1 small apple, 8 large strawberries, 1 cup canned fruit,  cup dried fruit, or 1 cup 100% juice. Vegetables Aim to eat 2-3 cup-equivalents of fresh and frozen vegetables each day, including different varieties and colors. Examples  of 1 cup-equivalent of vegetables include 2 medium carrots, 2 cups raw, leafy greens, 1 cup chopped vegetable (raw or cooked), or 1 medium baked potato. Grains Aim to eat 6 ounce-equivalents of whole grains each day. Examples of 1 ounce-equivalent of grains include 1 slice of bread, 1 cup ready-to-eat cereal, 3 cups popcorn, or  cup cooked rice, pasta, or cereal. Meats and other proteins Aim to eat 5-6 ounce-equivalents of protein  each day. Examples of 1 ounce-equivalent of protein include 1 egg, 1/2 cup nuts or seeds, or 1 tablespoon (16 g) peanut butter. A cut of meat or fish that is the size of a deck of cards is about 3-4 ounce-equivalents.  Of the protein you eat each week, try to have at least 8 ounces come from seafood. This includes salmon, trout, herring, and anchovies. Dairy Aim to eat 3 cup-equivalents of fat-free or low-fat dairy each day. Examples of 1 cup-equivalent of dairy include 1 cup (240 mL) milk, 8 ounces (250 g) yogurt, 1 ounces (44 g) natural cheese, or 1 cup (240 mL) fortified soy milk. Fats and oils  Aim for about 5 teaspoons (21 g) per day. Choose monounsaturated fats, such as canola and olive oils, avocados, peanut butter, and most nuts, or polyunsaturated fats, such as sunflower, corn, and soybean oils, walnuts, pine nuts, sesame seeds, sunflower seeds, and flaxseed. Beverages  Aim for six 8-oz glasses of water per day. Limit coffee to three to five 8-oz cups per day.  Limit caffeinated beverages that have added calories, such as soda and energy drinks.  Limit alcohol intake to no more than 1 drink a day for nonpregnant women and 2 drinks a day for men. One drink equals 12 oz of beer (355 mL), 5 oz of wine (148 mL), or 1 oz of hard liquor (44 mL). Seasoning and other foods  Avoid adding excess amounts of salt to your foods. Try flavoring foods with herbs and spices instead of salt.  Avoid adding sugar to foods.  Try using oil-based dressings, sauces, and  spreads instead of solid fats. This information is based on general [place] nutrition guidelines. For more information, visit [employer]. Exact amounts may vary based on your nutrition needs. Summary  A healthy eating plan may help you to maintain a healthy weight, reduce the risk of chronic diseases, and stay active throughout your life.  Plan your meals. Make sure you eat the right portions of a variety of nutrient-rich foods.  Try baking, boiling, grilling, or broiling instead of frying.  Choose healthy options in all settings, including home, work, school, restaurants, or stores. This information is not intended to replace advice given to you by your health care provider. Make sure you discuss any questions you have with your health care provider. Document Released: 04/14/2017 Document Revised: 04/14/2017 Document Reviewed: 04/14/2017 Elsevier Patient Education  [place].  Diabetes Mellitus and Nutrition, Adult When you have diabetes (diabetes mellitus), it is very important to have healthy eating habits because your blood sugar (glucose) levels are greatly affected by what you eat and drink. Eating healthy foods in the appropriate amounts, at about the same times every day, can help you:  Control your blood glucose.  Lower your risk of heart disease.  Improve your blood pressure.  Reach or maintain a healthy weight. Every person with diabetes is different, and each person has different needs for a meal plan. Your health care provider may recommend that you work with a diet and nutrition specialist (dietitian) to make a meal plan that is best for you. Your meal plan may vary depending on factors such as:  The calories you need.  The medicines you take.  Your weight.  Your blood glucose, blood pressure, and cholesterol levels.  Your activity level.  Other health conditions you have, such as heart or kidney disease. How do carbohydrates affect me? Carbohydrates,  also called carbs, affect your blood glucose level more than  any other type of food. Eating carbs naturally raises the amount of glucose in your blood. Carb counting is a method for keeping track of how many carbs you eat. Counting carbs is important to keep your blood glucose at a healthy level, especially if you use insulin or take certain oral diabetes medicines. It is important to know how many carbs you can safely have in each meal. This is different for every person. Your dietitian can help you calculate how many carbs you should have at each meal and for each snack. Foods that contain carbs include:  Bread, cereal, rice, pasta, and crackers.  Potatoes and corn.  Peas, beans, and lentils.  Milk and yogurt.  Fruit and juice.  Desserts, such as cakes, cookies, ice cream, and candy. How does alcohol affect me? Alcohol can cause a sudden decrease in blood glucose (hypoglycemia), especially if you use insulin or take certain oral diabetes medicines. Hypoglycemia can be a life-threatening condition. Symptoms of hypoglycemia (sleepiness, dizziness, and confusion) are similar to symptoms of having too much alcohol. If your health care provider says that alcohol is safe for you, follow these guidelines:  Limit alcohol intake to no more than 1 drink per day for nonpregnant women and 2 drinks per day for men. One drink equals 12 oz of beer, 5 oz of wine, or 1 oz of hard liquor.  Do not drink on an empty stomach.  Keep yourself hydrated with water, diet soda, or unsweetened iced tea.  Keep in mind that regular soda, juice, and other mixers may contain a lot of sugar and must be counted as carbs. What are tips for following this plan?  Reading food labels  Start by checking the serving size on the "Nutrition Facts" label of packaged foods and drinks. The amount of calories, carbs, fats, and other nutrients listed on the label is based on one serving of the item. Many items contain more than  one serving per package.  Check the total grams (g) of carbs in one serving. You can calculate the number of servings of carbs in one serving by dividing the total carbs by 15. For example, if a food has 30 g of total carbs, it would be equal to 2 servings of carbs.  Check the number of grams (g) of saturated and trans fats in one serving. Choose foods that have low or no amount of these fats.  Check the number of milligrams (mg) of salt (sodium) in one serving. Most people should limit total sodium intake to less than 2,300 mg per day.  Always check the nutrition information of foods labeled as "low-fat" or "nonfat". These foods may be higher in added sugar or refined carbs and should be avoided.  Talk to your dietitian to identify your daily goals for nutrients listed on the label. Shopping  Avoid buying canned, premade, or processed foods. These foods tend to be high in fat, sodium, and added sugar.  Shop around the outside edge of the grocery store. This includes fresh fruits and vegetables, bulk grains, fresh meats, and fresh dairy. Cooking  Use low-heat cooking methods, such as baking, instead of high-heat cooking methods like deep frying.  Cook using healthy oils, such as olive, canola, or sunflower oil.  Avoid cooking with butter, cream, or high-fat meats. Meal planning  Eat meals and snacks regularly, preferably at the same times every day. Avoid going long periods of time without eating.  Eat foods high in fiber, such as fresh fruits, vegetables,  beans, and whole grains. Talk to your dietitian about how many servings of carbs you can eat at each meal.  Eat 4-6 ounces (oz) of lean protein each day, such as lean meat, chicken, fish, eggs, or tofu. One oz of lean protein is equal to: ? 1 oz of meat, chicken, or fish. ? 1 egg. ?  cup of tofu.  Eat some foods each day that contain healthy fats, such as avocado, nuts, seeds, and fish. Lifestyle  Check your blood glucose  regularly.  Exercise regularly as told by your health care provider. This may include: ? 150 minutes of moderate-intensity or vigorous-intensity exercise each week. This could be brisk walking, biking, or water aerobics. ? Stretching and doing strength exercises, such as yoga or weightlifting, at least 2 times a week.  Take medicines as told by your health care provider.  Do not use any products that contain nicotine or tobacco, such as cigarettes and e-cigarettes. If you need help quitting, ask your health care provider.  Work with a Social worker or diabetes educator to identify strategies to manage stress and any emotional and social challenges. Questions to ask a health care provider  Do I need to meet with a diabetes educator?  Do I need to meet with a dietitian?  What number can I call if I have questions?  When are the best times to check my blood glucose? Where to find more information:  American Diabetes Association: diabetes.org  Academy of Nutrition and Dietetics: www.eatright.[employer] of Diabetes and Digestive and Kidney Diseases (NIH): [employer] Summary  A healthy meal plan will help you control your blood glucose and maintain a healthy lifestyle.  Working with a diet and nutrition specialist (dietitian) can help you make a meal plan that is best for you.  Keep in mind that carbohydrates (carbs) and alcohol have immediate effects on your blood glucose levels. It is important to count carbs and to use alcohol carefully. This information is not intended to replace advice given to you by your health care provider. Make sure you discuss any questions you have with your health care provider. Document Released: 09/27/2004 Document Revised: 12/13/2016 Document Reviewed: 02/05/2016 Elsevier Patient Education  2020 [employer].

## 2018-12-06 ENCOUNTER — Telehealth: Payer: Self-pay | Admitting: Internal Medicine

## 2018-12-06 NOTE — Telephone Encounter (Signed)
  Notes recorded by Dorna Leitz, CMA on 12/04/2018 at 9:37 AM EST  LMTCB.  ------     Notes recorded by McLean-Scocuzza, Nino Glow, MD on 12/04/2018 at 8:03 AM EST  With shortness of breath and IUD  I rec we ordered CT chest as D dimer is elevated and [place] legs  -are you agreeable?   [place]

## 2018-12-07 ENCOUNTER — Other Ambulatory Visit
Admission: RE | Admit: 2018-12-07 | Discharge: 2018-12-07 | Disposition: A | Discharge status: Home / Self Care | Payer: 59 | Source: Ambulatory Visit | Attending: Internal Medicine | Admitting: Internal Medicine

## 2018-12-07 ENCOUNTER — Encounter: Payer: Self-pay | Admitting: Internal Medicine

## 2018-12-07 ENCOUNTER — Ambulatory Visit
Admission: RE | Admit: 2018-12-07 | Discharge: 2018-12-07 | Disposition: A | Discharge status: Home / Self Care | Payer: 59 | Source: Ambulatory Visit | Attending: Internal Medicine | Admitting: Internal Medicine

## 2018-12-07 ENCOUNTER — Other Ambulatory Visit: Payer: Self-pay | Admitting: Internal Medicine

## 2018-12-07 ENCOUNTER — Other Ambulatory Visit: Payer: Self-pay

## 2018-12-07 DIAGNOSIS — Z975 Presence of (intrauterine) contraceptive device: Secondary | ICD-10-CM

## 2018-12-07 DIAGNOSIS — R319 Hematuria, unspecified: Secondary | ICD-10-CM | POA: Diagnosis not present

## 2018-12-07 DIAGNOSIS — R0602 Shortness of breath: Secondary | ICD-10-CM

## 2018-12-07 DIAGNOSIS — E119 Type 2 diabetes mellitus without complications: Secondary | ICD-10-CM | POA: Insufficient documentation

## 2018-12-07 DIAGNOSIS — M79604 Pain in right leg: Secondary | ICD-10-CM | POA: Insufficient documentation

## 2018-12-07 DIAGNOSIS — M79605 Pain in left leg: Secondary | ICD-10-CM

## 2018-12-07 DIAGNOSIS — D509 Iron deficiency anemia, unspecified: Secondary | ICD-10-CM | POA: Diagnosis not present

## 2018-12-07 DIAGNOSIS — R7989 Other specified abnormal findings of blood chemistry: Secondary | ICD-10-CM | POA: Diagnosis not present

## 2018-12-07 LAB — URINALYSIS, ROUTINE W REFLEX MICROSCOPIC
Bacteria, UA: NONE SEEN
Bilirubin Urine: NEGATIVE
Glucose, UA: NEGATIVE mg/dL
Hgb urine dipstick: NEGATIVE
Ketones, ur: 20 mg/dL — AB
Nitrite: NEGATIVE
Protein, ur: NEGATIVE mg/dL
Specific Gravity, Urine: 1.019 (ref 1.005–1.030)
pH: 5 (ref 5.0–8.0)

## 2018-12-07 LAB — COMPREHENSIVE METABOLIC PANEL
ALT: 25 U/L (ref 0–44)
AST: 32 U/L (ref 15–41)
Albumin: 4.5 g/dL (ref 3.5–5.0)
Alkaline Phosphatase: 43 U/L (ref 38–126)
Anion gap: 11 (ref 5–15)
BUN: 11 mg/dL (ref 6–20)
CO2: 24 mmol/L (ref 22–32)
Calcium: 9.3 mg/dL (ref 8.9–10.3)
Chloride: 102 mmol/L (ref 98–111)
Creatinine, Ser: 0.66 mg/dL (ref 0.44–1.00)
GFR calc Af Amer: >60 mL/min (ref >60)
GFR calc non Af Amer: >60 mL/min (ref >60)
Glucose, Bld: 74 mg/dL (ref 70–99)
Potassium: 3.5 mmol/L (ref 3.5–5.1)
Sodium: 137 mmol/L (ref 135–145)
Total Bilirubin: 1 mg/dL (ref 0.3–1.2)
Total Protein: 8.6 g/dL — ABNORMAL HIGH (ref 6.5–8.1)

## 2018-12-07 LAB — CBC WITH DIFFERENTIAL/PLATELET
Abs Immature Granulocytes: 0.02 10*3/uL (ref 0.00–0.07)
Basophils Absolute: 0 10*3/uL (ref 0.0–0.1)
Basophils Relative: 1 %
Eosinophils Absolute: 0.1 10*3/uL (ref 0.0–0.5)
Eosinophils Relative: 2 %
HCT: 38.4 % (ref 36.0–46.0)
Hemoglobin: 12.8 g/dL (ref 12.0–15.0)
Immature Granulocytes: 0 %
Lymphocytes Relative: 20 %
Lymphs Abs: 1 10*3/uL (ref 0.7–4.0)
MCH: 25 pg — ABNORMAL LOW (ref 26.0–34.0)
MCHC: 33.3 g/dL (ref 30.0–36.0)
MCV: 75 fL — ABNORMAL LOW (ref 80.0–100.0)
Monocytes Absolute: 0.3 10*3/uL (ref 0.1–1.0)
Monocytes Relative: 5 %
Neutro Abs: 3.7 10*3/uL (ref 1.7–7.7)
Neutrophils Relative %: 72 %
Platelets: 325 10*3/uL (ref 150–400)
RBC: 5.12 MIL/uL — ABNORMAL HIGH (ref 3.87–5.11)
RDW: 15.6 % — ABNORMAL HIGH (ref 11.5–15.5)
WBC: 5.1 10*3/uL (ref 4.0–10.5)
nRBC: 0 % (ref 0.0–0.2)

## 2018-12-07 LAB — LIPID PANEL
Cholesterol: 221 mg/dL — ABNORMAL HIGH (ref 0–200)
HDL: 59 mg/dL (ref >40)
LDL Cholesterol: 147 mg/dL — ABNORMAL HIGH (ref 0–99)
Total CHOL/HDL Ratio: 3.7 RATIO
Triglycerides: 73 mg/dL (ref <150)
VLDL: 15 mg/dL (ref 0–40)

## 2018-12-07 LAB — POCT I-STAT CREATININE: Creatinine, Ser: 0.6 mg/dL (ref 0.44–1.00)

## 2018-12-07 LAB — FERRITIN: Ferritin: 20 ng/mL (ref 11–307)

## 2018-12-07 LAB — IRON AND TIBC
Iron: 75 ug/dL (ref 28–170)
Saturation Ratios: 16 % (ref 10.4–31.8)
TIBC: 470 ug/dL — ABNORMAL HIGH (ref 250–450)
UIBC: 395 ug/dL

## 2018-12-07 MED ORDER — IOHEXOL 350 MG/ML SOLN
75.0000 mL | Freq: Once | INTRAVENOUS | Status: AC | PRN
  Administered 2018-12-07: 75 mL via INTRAVENOUS

## 2018-12-07 NOTE — Telephone Encounter (Signed)
[place] and CT chest ordered  Pt needs stat creatinine at hospital lab [facility] please schedule   [place]

## 2018-12-07 NOTE — Telephone Encounter (Signed)
Called patient agreed to testing.

## 2018-12-08 ENCOUNTER — Other Ambulatory Visit: Payer: Self-pay | Admitting: Internal Medicine

## 2018-12-08 DIAGNOSIS — R718 Other abnormality of red blood cells: Secondary | ICD-10-CM

## 2018-12-08 DIAGNOSIS — D649 Anemia, unspecified: Secondary | ICD-10-CM

## 2018-12-08 LAB — MICROALBUMIN / CREATININE URINE RATIO
Creatinine, Urine: 160 mg/dL
Microalb Creat Ratio: 6 mg/g creat (ref 0–29)
Microalb, Ur: 9.7 ug/mL — ABNORMAL HIGH

## 2018-12-18 NOTE — Telephone Encounter (Signed)
This has been completed.

## 2018-12-29 ENCOUNTER — Encounter: Payer: Self-pay | Admitting: Internal Medicine

## 2018-12-29 DIAGNOSIS — E1169 Type 2 diabetes mellitus with other specified complication: Secondary | ICD-10-CM | POA: Diagnosis not present

## 2018-12-29 DIAGNOSIS — E559 Vitamin D deficiency, unspecified: Secondary | ICD-10-CM | POA: Diagnosis not present

## 2018-12-29 DIAGNOSIS — Z794 Long term (current) use of insulin: Secondary | ICD-10-CM | POA: Diagnosis not present

## 2018-12-29 DIAGNOSIS — E1165 Type 2 diabetes mellitus with hyperglycemia: Secondary | ICD-10-CM | POA: Diagnosis not present

## 2018-12-29 LAB — HM DIABETES EYE EXAM

## 2019-01-27 ENCOUNTER — Encounter: Payer: Self-pay | Admitting: Dietician

## 2019-01-27 NOTE — Progress Notes (Signed)
Patient returned 16-month evaluation by mail. Documented results in diabetes education flowsheet. She did not give update info on eye exam status, self-foot check, or recent fasting BGs.

## 2019-03-04 ENCOUNTER — Encounter: Payer: 59 | Admitting: Certified Nurse Midwife

## 2019-03-11 ENCOUNTER — Telehealth: Payer: 59 | Admitting: Internal Medicine

## 2019-03-22 ENCOUNTER — Encounter: Payer: 59 | Admitting: Certified Nurse Midwife

## 2019-04-09 ENCOUNTER — Other Ambulatory Visit: Payer: Self-pay | Admitting: Internal Medicine

## 2019-04-09 DIAGNOSIS — F411 Generalized anxiety disorder: Secondary | ICD-10-CM

## 2019-04-09 MED ORDER — ALPRAZOLAM 0.25 MG PO TABS: 0.2500 mg | ORAL_TABLET | Freq: Two times a day (BID) | ORAL | 2 refills | Status: DC | PRN

## 2019-04-12 ENCOUNTER — Encounter: Payer: Self-pay | Admitting: Internal Medicine

## 2019-04-21 ENCOUNTER — Encounter: Payer: Self-pay | Admitting: Internal Medicine

## 2019-04-21 ENCOUNTER — Ambulatory Visit (INDEPENDENT_AMBULATORY_CARE_PROVIDER_SITE_OTHER): Payer: 59 | Admitting: Internal Medicine

## 2019-04-21 VITALS — BP 124/88 | HR 78 | Temp 97.4°F | Ht 60.0 in | Wt 163.0 lb

## 2019-04-21 DIAGNOSIS — F329 Major depressive disorder, single episode, unspecified: Secondary | ICD-10-CM | POA: Diagnosis not present

## 2019-04-21 DIAGNOSIS — Z1329 Encounter for screening for other suspected endocrine disorder: Secondary | ICD-10-CM

## 2019-04-21 DIAGNOSIS — F419 Anxiety disorder, unspecified: Secondary | ICD-10-CM

## 2019-04-21 DIAGNOSIS — J9801 Acute bronchospasm: Secondary | ICD-10-CM | POA: Diagnosis not present

## 2019-04-21 DIAGNOSIS — G4700 Insomnia, unspecified: Secondary | ICD-10-CM

## 2019-04-21 DIAGNOSIS — Z794 Long term (current) use of insulin: Secondary | ICD-10-CM | POA: Diagnosis not present

## 2019-04-21 DIAGNOSIS — J45909 Unspecified asthma, uncomplicated: Secondary | ICD-10-CM

## 2019-04-21 DIAGNOSIS — E559 Vitamin D deficiency, unspecified: Secondary | ICD-10-CM

## 2019-04-21 DIAGNOSIS — E1165 Type 2 diabetes mellitus with hyperglycemia: Secondary | ICD-10-CM | POA: Diagnosis not present

## 2019-04-21 MED ORDER — ALBUTEROL SULFATE HFA 108 (90 BASE) MCG/ACT IN AERS: 1.0000 | INHALATION_SPRAY | Freq: Four times a day (QID) | RESPIRATORY_TRACT | 11 refills | Status: DC | PRN

## 2019-04-21 NOTE — Progress Notes (Signed)
Chief Complaint  Patient presents with  . Follow-up   F/U WITH STEP DAUGHTER Tianna today  1. DM 2 last A1C 5.7 on ozempic 0.5 and metformin 500 mg bid f/u [facility] upcoming will do fasting labs  C/o weight gain eating sweets I.e candy   2. Increased anxiety with h/o depression and h/o insomnia she stopped restoril and on zoloft 100 mg qd and xanax 0.25 mg bid prn has not has to utilize xanax bid prn [facility]able [facility]  3. Ob/gyn upcoming appt   Review of Systems  Constitutional: Negative for weight loss.  HENT: Negative for hearing loss.   Eyes: Negative for blurred vision.  Respiratory: Negative for shortness of breath.   Cardiovascular: Negative for chest pain.  Gastrointestinal: Negative for abdominal pain.  Musculoskeletal: Negative for falls.  Skin: Negative for rash.  Psychiatric/Behavioral: The patient is nervous/anxious.    Past Medical History:  Diagnosis Date  . Anemia   . Anxiety   . Depression   . Diabetes mellitus without complication ([place])   . Herpes    1/2 +  . History of chicken pox   . History of urinary tract infection   . Insomnia   . Iron deficiency   . Vitamin D deficiency    Past Surgical History:  Procedure Laterality Date  . DILATION AND CURETTAGE OF UTERUS  2001   s/p miscarriage  . ENDOMETRIAL ABLATION  2006   hx of abnormal pap  . TONSILLECTOMY AND ADENOIDECTOMY  1994  . TUBAL LIGATION  2006   Family History  Problem Relation Age of Onset  . Diabetes Mother   . Hypertension Mother   . Hypertension Father   . Heart disease Father        CABG  . Cancer Maternal Grandfather        Lung cancer - non smoker  . Breast cancer Paternal Grandmother   . Bipolar disorder Sister   . Stroke Maternal Grandmother   . Bipolar disorder Cousin   . Stomach cancer Other        maternal uncle  . Ovarian cancer Neg Hx   . Colon cancer Neg Hx    Social History   Socioeconomic History  . Marital status: Married    Spouse  name: Not on file  . Number of children: 1  . Years of education: 6  . Highest education level: Not on file  Occupational History  . Occupation: [employer]    Comment: [employer]  . Occupation: Associate Professor    Comment: [employer]  Tobacco Use  . Smoking status: Former Smoker    Packs/day: 0.25    Years: 18.00    Pack years: 4.50    Start date: 07/14/2017  . Smokeless tobacco: Never Used  Substance and Sexual Activity  . Alcohol use: Yes    Alcohol/week: 0.0 standard drinks    Comment: Occassional glass of wine  . Drug use: No  . Sexual activity: Yes    Partners: Male    Birth control/protection: Surgical  Other Topics Concern  . Not on file  Social History Narrative   [name] grew up in [place], [place]. She lives in [place] with her Husband and their son. Her husband has 3 daughters that live with them as well. She has 75 y.o son as of 06/2017. 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 She enjoys spending time with the family, outdoor activities, shopping.   Married x 16 years       Exercise - not currently   Caffeine - 1 cup daily   Social Determinants of Health   Financial Resource Strain:   . Difficulty of Paying Living Expenses:   Food Insecurity:   . Worried About Charity fundraiser in the Last Year:   . Arboriculturist in the Last Year:   Transportation Needs:   . Film/video editor (Medical):   Marland Kitchen Lack of Transportation (Non-Medical):   Physical Activity:   . Days of Exercise per Week:   . Minutes of Exercise per Session:   Stress:   . Feeling of Stress :   Social Connections:   . Frequency of Communication with Friends and Family:   . Frequency of Social Gatherings with Friends and Family:   . Attends Religious Services:   . Active Member of Clubs or Organizations:   . Attends Archivist Meetings:   Marland Kitchen Marital Status:   Intimate  Partner Violence:   . Fear of Current or Ex-Partner:   . Emotionally Abused:   Marland Kitchen Physically Abused:   . Sexually Abused:    Current Meds  Medication Sig  . albuterol (VENTOLIN HFA) 108 (90 Base) MCG/ACT inhaler Inhale 1-2 puffs into the lungs every 6 (six) hours as needed for wheezing or shortness of breath.  . ALPRAZolam (XANAX) 0.25 MG tablet Take 1 tablet (0.25 mg total) by mouth 2 (two) times daily as needed for anxiety.  . Biotin 1 MG CAPS Take by mouth every other day.   . blood glucose meter kit and supplies Dispense based on patient and insurance preference. Use up to three times daily as directed. ( E11.9). one touch  . Blood Glucose Monitoring Suppl (FREESTYLE LITE) DEVI   . Cholecalciferol (VITAMIN D) 125 MCG (5000 UT) CAPS Take by mouth.  . ferrous sulfate 325 (65 FE) MG EC tablet Take 325 mg by mouth once a week.  Marland Kitchen FREESTYLE LITE test strip   . Insulin Pen Needle (FIFTY50 PEN NEEDLES) 32G X 4 MM MISC Use 1 each once daily  . Lancets (FREESTYLE) lancets   . metFORMIN (GLUCOPHAGE) 500 MG tablet Take 500 mg by mouth 2 (two) times a day.  . rosuvastatin (CRESTOR) 5 MG tablet Take by mouth.  . Semaglutide,0.25 or 0.5MG/DOS, 2 MG/1.5ML SOPN Inject 0.5 mg into the skin once a week.  . sertraline (ZOLOFT) 100 MG tablet Take 1.5 tablets (150 mg total) by mouth daily.  . [DISCONTINUED] albuterol (PROVENTIL HFA;VENTOLIN HFA) 108 (90 Base) MCG/ACT inhaler Inhale 2 puffs into the lungs every 6 (six) hours as needed for wheezing or shortness of breath.   No Known Allergies No results found for this or any previous visit (from the past 2160 hour(s)). Objective  Body mass index is 31.83 kg/m. Wt Readings from Last 3 Encounters:  04/21/19 163 lb (73.9 kg)  12/03/18 152 lb 12.8 oz (69.3 kg)  09/17/18 154 lb (69.9 kg)   Temp Readings from Last 3 Encounters:  04/21/19 (!) 97.4 F (36.3 C) (Temporal)  12/03/18 (!) 97.3 F (36.3 C) (Skin)  03/23/18 99.9 F (37.7 C) (Oral)   BP  Readings from Last 3 Encounters:  04/21/19 124/88  12/03/18 126/80  05/18/18 124/84   Pulse Readings from Last 3 Encounters:  04/21/19 78  12/03/18 93  04/23/18 90    Physical Exam Vitals and nursing note  reviewed.  Constitutional:      Appearance: Normal appearance. She is well-developed and well-groomed. She is obese.  HENT:     Head: Normocephalic and atraumatic.  Eyes:     Conjunctiva/sclera: Conjunctivae normal.     Pupils: Pupils are equal, round, and reactive to light.  Cardiovascular:     Rate and Rhythm: Normal rate and regular rhythm.     Heart sounds: Normal heart sounds. No murmur.  Pulmonary:     Effort: Pulmonary effort is normal.     Breath sounds: Normal breath sounds.  Skin:    General: Skin is warm and dry.  Neurological:     General: No focal deficit present.     Mental Status: She is alert and oriented to person, place, and time. Mental status is at baseline.  Psychiatric:        Attention and Perception: Attention and perception normal.        Mood and Affect: Mood and affect normal.        Speech: Speech normal.        Behavior: Behavior normal. Behavior is cooperative.        Thought Content: Thought content normal.        Cognition and Memory: Cognition and memory normal.        Judgment: Judgment normal.     Assessment  Plan  Anxiety and depression/insomnia- Plan: Ambulatory referral to [facility]  Bronchospasm - Plan: albuterol (VENTOLIN HFA) 108 (90 Base) MCG/ACT inhaler  Bronchitis, allergic, unspecified asthma severity, uncomplicated - Plan: albuterol (VENTOLIN HFA) 108 (90 Base) MCG/ACT inhaler  Type 2 diabetes mellitus with hyperglycemia, with long-term current use of insulin ([place]) - Plan: Comprehensive metabolic panel, CBC w/Diff, Lipid panel, Hemoglobin A1c F/u [facility] 05/04/19  Eye appt due 12/2019   HM Fasting labs [facility] endocrine lab orders given today 12/03/18  Flu shot had 09/28/2018 at work  -consider pna 23  vaccine in future  Tdap utd 12/03/18   MMR immune  Hep B immune covid 19 2/2   Pap [facility] let ob/gyn [facility] 06/01/15 neg neg hpv -will rec OB/GYN do pap [facility] due for IUD check IUD placed 04/23/18 Mackie Pai seen needs pap and sch 05/03/19   mammo7/10/19 negative -ordered new mammo pt to call and schedule Colonoscopy screening age 34   Congratulated on smoking cessation  Healthy diet and exercise Vitamin D 3 5000 IU qd  Provider: Dr. Olivia Mackie Mc[facility]-Scocuz[facility]-Internal Medicine

## 2019-04-21 NOTE — Patient Instructions (Addendum)
[facility]  Xyzal*, claritin/zyrtec/allegra    Diabetes Mellitus and Nutrition, Adult When you have diabetes (diabetes mellitus), it is very important to have healthy eating habits because your blood sugar (glucose) levels are greatly affected by what you eat and drink. Eating healthy foods in the appropriate amounts, at about the same times every day, can help you:  Control your blood glucose.  Lower your risk of heart disease.  Improve your blood pressure.  Reach or maintain a healthy weight. Every person with diabetes is different, and each person has different needs for a meal plan. Your health care provider may recommend that you work with a diet and nutrition specialist (dietitian) to make a meal plan that is best for you. Your meal plan may vary depending on factors such as:  The calories you need.  The medicines you take.  Your weight.  Your blood glucose, blood pressure, and cholesterol levels.  Your activity level.  Other health conditions you have, such as heart or kidney disease. How do carbohydrates affect me? Carbohydrates, also called carbs, affect your blood glucose level more than any other type of food. Eating carbs naturally raises the amount of glucose in your blood. Carb counting is a method for keeping track of how many carbs you eat. Counting carbs is important to keep your blood glucose at a healthy level, especially if you use insulin or take certain oral diabetes medicines. It is important to know how many carbs you can safely have in each meal. This is different for every person. Your dietitian can help you calculate how many carbs you should have at each meal and for each snack. Foods that contain carbs include:  Bread, cereal, rice, pasta, and crackers.  Potatoes and corn.  Peas, beans, and lentils.  Milk and yogurt.  Fruit and juice.  Desserts, such as cakes, cookies, ice cream, and candy. How does alcohol affect me? Alcohol can cause a  sudden decrease in blood glucose (hypoglycemia), especially if you use insulin or take certain oral diabetes medicines. Hypoglycemia can be a life-threatening condition. Symptoms of hypoglycemia (sleepiness, dizziness, and confusion) are similar to symptoms of having too much alcohol. If your health care provider says that alcohol is safe for you, follow these guidelines:  Limit alcohol intake to no more than 1 drink per day for nonpregnant women and 2 drinks per day for men. One drink equals 12 oz of beer, 5 oz of wine, or 1 oz of hard liquor.  Do not drink on an empty stomach.  Keep yourself hydrated with water, diet soda, or unsweetened iced tea.  Keep in mind that regular soda, juice, and other mixers may contain a lot of sugar and must be counted as carbs. What are tips for following this plan?  Reading food labels  Start by checking the serving size on the "Nutrition Facts" label of packaged foods and drinks. The amount of calories, carbs, fats, and other nutrients listed on the label is based on one serving of the item. Many items contain more than one serving per package.  Check the total grams (g) of carbs in one serving. You can calculate the number of servings of carbs in one serving by dividing the total carbs by 15. For example, if a food has 30 g of total carbs, it would be equal to 2 servings of carbs.  Check the number of grams (g) of saturated and trans fats in one serving. Choose foods that have low or no  amount of these fats.  Check the number of milligrams (mg) of salt (sodium) in one serving. Most people should limit total sodium intake to less than 2,300 mg per day.  Always check the nutrition information of foods labeled as "low-fat" or "nonfat". These foods may be higher in added sugar or refined carbs and should be avoided.  Talk to your dietitian to identify your daily goals for nutrients listed on the label. Shopping  Avoid buying canned, premade, or processed  foods. These foods tend to be high in fat, sodium, and added sugar.  Shop around the outside edge of the grocery store. This includes fresh fruits and vegetables, bulk grains, fresh meats, and fresh dairy. Cooking  Use low-heat cooking methods, such as baking, instead of high-heat cooking methods like deep frying.  Cook using healthy oils, such as olive, canola, or sunflower oil.  Avoid cooking with butter, cream, or high-fat meats. Meal planning  Eat meals and snacks regularly, preferably at the same times every day. Avoid going long periods of time without eating.  Eat foods high in fiber, such as fresh fruits, vegetables, beans, and whole grains. Talk to your dietitian about how many servings of carbs you can eat at each meal.  Eat 4-6 ounces (oz) of lean protein each day, such as lean meat, chicken, fish, eggs, or tofu. One oz of lean protein is equal to: ? 1 oz of meat, chicken, or fish. ? 1 egg. ?  cup of tofu.  Eat some foods each day that contain healthy fats, such as avocado, nuts, seeds, and fish. Lifestyle  Check your blood glucose regularly.  Exercise regularly as told by your health care provider. This may include: ? 150 minutes of moderate-intensity or vigorous-intensity exercise each week. This could be brisk walking, biking, or water aerobics. ? Stretching and doing strength exercises, such as yoga or weightlifting, at least 2 times a week.  Take medicines as told by your health care provider.  Do not use any products that contain nicotine or tobacco, such as cigarettes and e-cigarettes. If you need help quitting, ask your health care provider.  Work with a Social worker or diabetes educator to identify strategies to manage stress and any emotional and social challenges. Questions to ask a health care provider  Do I need to meet with a diabetes educator?  Do I need to meet with a dietitian?  What number can I call if I have questions?  When are the best times  to check my blood glucose? Where to find more information:  American Diabetes Association: diabetes.org  Academy of Nutrition and Dietetics: www.eatright.[employer] of Diabetes and Digestive and Kidney Diseases (NIH): [employer] Summary  A healthy meal plan will help you control your blood glucose and maintain a healthy lifestyle.  Working with a diet and nutrition specialist (dietitian) can help you make a meal plan that is best for you.  Keep in mind that carbohydrates (carbs) and alcohol have immediate effects on your blood glucose levels. It is important to count carbs and to use alcohol carefully. This information is not intended to replace advice given to you by your health care provider. Make sure you discuss any questions you have with your health care provider. Document Revised: 12/13/2016 Document Reviewed: 02/05/2016 Elsevier Patient Education  2020 [employer].

## 2019-05-03 ENCOUNTER — Encounter: Payer: 59 | Admitting: Certified Nurse Midwife

## 2019-05-03 NOTE — Progress Notes (Deleted)
Pt present for annual exam.  

## 2019-05-11 ENCOUNTER — Ambulatory Visit
Admission: RE | Admit: 2019-05-11 | Discharge: 2019-05-11 | Disposition: A | Discharge status: Home / Self Care | Payer: 59 | Source: Ambulatory Visit | Attending: Internal Medicine | Admitting: Internal Medicine

## 2019-05-11 DIAGNOSIS — Z1231 Encounter for screening mammogram for malignant neoplasm of breast: Secondary | ICD-10-CM | POA: Insufficient documentation

## 2019-05-27 DIAGNOSIS — E1165 Type 2 diabetes mellitus with hyperglycemia: Secondary | ICD-10-CM | POA: Diagnosis not present

## 2019-05-27 DIAGNOSIS — E559 Vitamin D deficiency, unspecified: Secondary | ICD-10-CM | POA: Diagnosis not present

## 2019-05-27 DIAGNOSIS — R5383 Other fatigue: Secondary | ICD-10-CM | POA: Diagnosis not present

## 2019-05-27 DIAGNOSIS — E785 Hyperlipidemia, unspecified: Secondary | ICD-10-CM | POA: Diagnosis not present

## 2019-05-27 DIAGNOSIS — Z794 Long term (current) use of insulin: Secondary | ICD-10-CM | POA: Diagnosis not present

## 2019-05-27 DIAGNOSIS — E1169 Type 2 diabetes mellitus with other specified complication: Secondary | ICD-10-CM | POA: Diagnosis not present

## 2019-05-27 LAB — HEMOGLOBIN A1C: Hemoglobin A1C: 6.3

## 2019-06-18 ENCOUNTER — Encounter: Payer: 59 | Admitting: Certified Nurse Midwife

## 2019-06-23 ENCOUNTER — Encounter: Payer: Self-pay | Admitting: Internal Medicine

## 2019-06-23 ENCOUNTER — Telehealth: Payer: Self-pay | Admitting: Internal Medicine

## 2019-06-23 ENCOUNTER — Other Ambulatory Visit: Payer: Self-pay | Admitting: Internal Medicine

## 2019-06-23 DIAGNOSIS — G4700 Insomnia, unspecified: Secondary | ICD-10-CM

## 2019-06-23 DIAGNOSIS — F419 Anxiety disorder, unspecified: Secondary | ICD-10-CM

## 2019-06-23 DIAGNOSIS — F339 Major depressive disorder, recurrent, unspecified: Secondary | ICD-10-CM | POA: Insufficient documentation

## 2019-06-23 DIAGNOSIS — R45851 Suicidal ideations: Secondary | ICD-10-CM

## 2019-06-23 NOTE — Telephone Encounter (Signed)
Pt returned call she saud you can reach her on her cell phone [phone]

## 2019-06-23 NOTE — Telephone Encounter (Signed)
Noted  

## 2019-06-23 NOTE — Telephone Encounter (Signed)
Spoke with the Patient and she is agreeable to a urgent referral. States her son sees Dr Gweneth Fritter that is in our office so she can not see him.   Patient states that increasing her Zoloft was considered at one point. She is wondering if this is something she needs now? Patient is also currently taking Xanax twice a day.   She states she is feeling better today but she needs help to not get worse. Patient is at work today.

## 2019-06-23 NOTE — Telephone Encounter (Signed)
Left message to return call on office number, No answer, no voicemail on cell phone. Will send mychart message informing Patient that her message has been sent and asking how she feels now.

## 2019-06-23 NOTE — Telephone Encounter (Signed)
Pt returned call she saud you can reach her on her cell phone [phone]      Documentation    See prior note

## 2019-06-23 NOTE — Telephone Encounter (Signed)
Is she suicidal if so rec [facility] behavioral health or [facility] asap / Call pt to check on her

## 2019-06-23 NOTE — Telephone Encounter (Signed)
See patient message encounter

## 2019-07-12 DIAGNOSIS — F411 Generalized anxiety disorder: Secondary | ICD-10-CM | POA: Diagnosis not present

## 2019-07-12 DIAGNOSIS — F331 Major depressive disorder, recurrent, moderate: Secondary | ICD-10-CM | POA: Diagnosis not present

## 2019-07-12 DIAGNOSIS — F5105 Insomnia due to other mental disorder: Secondary | ICD-10-CM | POA: Diagnosis not present

## 2019-07-22 ENCOUNTER — Telehealth: Payer: Self-pay

## 2019-07-22 NOTE — Telephone Encounter (Signed)
05/27/19 A1C 6.3 [facility] update chart please

## 2019-07-22 NOTE — Telephone Encounter (Signed)
According to [facility] care gaps pt is over due for an A1c. Is it okay to order and schedule pt for a lab appt?

## 2019-07-23 ENCOUNTER — Other Ambulatory Visit: Payer: Self-pay

## 2019-07-23 NOTE — Telephone Encounter (Signed)
Chart has been updated.

## 2019-07-23 NOTE — Progress Notes (Signed)
Chart has been updated.

## 2019-07-25 DIAGNOSIS — F331 Major depressive disorder, recurrent, moderate: Secondary | ICD-10-CM | POA: Diagnosis not present

## 2019-07-25 DIAGNOSIS — F5105 Insomnia due to other mental disorder: Secondary | ICD-10-CM | POA: Diagnosis not present

## 2019-07-25 DIAGNOSIS — F411 Generalized anxiety disorder: Secondary | ICD-10-CM | POA: Diagnosis not present

## 2019-07-26 ENCOUNTER — Other Ambulatory Visit (HOSPITAL_COMMUNITY)
Admission: RE | Admit: 2019-07-26 | Discharge: 2019-07-26 | Disposition: A | Discharge status: Home / Self Care | Payer: 59 | Source: Ambulatory Visit | Attending: Certified Nurse Midwife | Admitting: Certified Nurse Midwife

## 2019-07-26 ENCOUNTER — Ambulatory Visit (INDEPENDENT_AMBULATORY_CARE_PROVIDER_SITE_OTHER): Payer: 59 | Admitting: Certified Nurse Midwife

## 2019-07-26 ENCOUNTER — Encounter: Payer: Self-pay | Admitting: Certified Nurse Midwife

## 2019-07-26 VITALS — BP 122/86 | HR 66 | Ht 60.0 in | Wt 166.8 lb

## 2019-07-26 DIAGNOSIS — Z124 Encounter for screening for malignant neoplasm of cervix: Secondary | ICD-10-CM | POA: Insufficient documentation

## 2019-07-26 DIAGNOSIS — Z975 Presence of (intrauterine) contraceptive device: Secondary | ICD-10-CM | POA: Diagnosis not present

## 2019-07-26 DIAGNOSIS — Z01419 Encounter for gynecological examination (general) (routine) without abnormal findings: Secondary | ICD-10-CM | POA: Insufficient documentation

## 2019-07-26 DIAGNOSIS — Z30431 Encounter for routine checking of intrauterine contraceptive device: Secondary | ICD-10-CM

## 2019-07-26 DIAGNOSIS — Z113 Encounter for screening for infections with a predominantly sexual mode of transmission: Secondary | ICD-10-CM | POA: Diagnosis not present

## 2019-07-26 LAB — HM PAP SMEAR: HM Pap smear: NORMAL

## 2019-07-26 NOTE — Progress Notes (Addendum)
ANNUAL PREVENTATIVE CARE GYN  ENCOUNTER NOTE  Subjective:       Jacqueline Padilla is a 44 y.o. G2P1011 female here for a routine annual gynecologic exam.  Current complaints: 1. Requests Pap smear 2. Desires STI screening  Notes occasional spotting with IUD, last 07/22/2019.   Denies difficulty breathing or respiratory distress, chest pain, abdominal pain, excessive vaginal bleeding, dysuria, and leg pain or swelling.    Gynecologic History  Patient's last menstrual period was 07/21/2019. Period Duration (Days): 5 Period Pattern: (!) Irregular Menstrual Flow: Moderate Menstrual Control: Maxi pad, Tampon Menstrual Control Change Freq (Hours): 2 Dysmenorrhea: None  Contraception: IUD, Mirena  Upstream - 07/26/19 0836      Pregnancy Intention Screening   Does the patient want to become pregnant in the next year? No    Does the patient's partner want to become pregnant in the next year? No    Would the patient like to discuss contraceptive options today? No      Contraception Wrap Up   Current Method IUD or IUS    Contraception Counseling Provided No          The pregnancy intention screening data noted above was reviewed. Potential methods of contraception were discussed. The patient elected to proceed with IUD or IUS.   Last Pap: 05/2015. Results were: normal  Last mammogram: 04/2019. Results were: normal  Obstetric History OB History  Gravida Para Term Preterm AB Living  '2 1 1   1 1  ' SAB TAB Ectopic Multiple Live Births  1       1    # Outcome Date GA Lbr Len/2nd Weight Sex Delivery Anes PTL Lv  2 Term 2003   7 lb 2.6 oz (3.248 kg) M Vag-Spont   LIV  1 SAB 2001            Past Medical History:  Diagnosis Date  . Anemia   . Anxiety   . Depression   . Diabetes mellitus without complication ([place])   . Herpes    1/2 +  . History of chicken pox   . History of urinary tract infection   . Insomnia   . Iron deficiency   . Vitamin D deficiency     Past  Surgical History:  Procedure Laterality Date  . DILATION AND CURETTAGE OF UTERUS  2001   s/p miscarriage  . ENDOMETRIAL ABLATION  2006   hx of abnormal pap  . TONSILLECTOMY AND ADENOIDECTOMY  1994  . TUBAL LIGATION  2006    Current Outpatient Medications on File Prior to Visit  Medication Sig Dispense Refill  . albuterol (VENTOLIN HFA) 108 (90 Base) MCG/ACT inhaler Inhale 1-2 puffs into the lungs every 6 (six) hours as needed for wheezing or shortness of breath. 18 g 11  . ALPRAZolam (XANAX) 0.25 MG tablet Take 1 tablet (0.25 mg total) by mouth 2 (two) times daily as needed for anxiety. 60 tablet 2  . Biotin 1 MG CAPS Take by mouth every other day.     . blood glucose meter kit and supplies Dispense based on patient and insurance preference. Use up to three times daily as directed. ( E11.9). one touch 1 each 0  . Blood Glucose Monitoring Suppl (FREESTYLE LITE) DEVI     . Cholecalciferol (VITAMIN D) 125 MCG (5000 UT) CAPS Take by mouth.    . cyclobenzaprine (FLEXERIL) 5 MG tablet TAKE 1 TABLET (5 MG TOTAL) BY MOUTH AT BEDTIME AS NEEDED FOR MUSCLE SPASMS. [place]  tablet 2  . ferrous sulfate 325 (65 FE) MG EC tablet Take 325 mg by mouth once a week.    Marland Kitchen FREESTYLE LITE test strip     . Lancets (FREESTYLE) lancets     . levonorgestrel (MIRENA) 20 MCG/24HR IUD 1 each by Intrauterine route once.    . ondansetron (ZOFRAN) 4 MG tablet Take 4 mg by mouth once a week. With ozempic on Saturday    . rosuvastatin (CRESTOR) 5 MG tablet Take by mouth.    . Semaglutide,0.25 or 0.5MG/DOS, 2 MG/1.5ML SOPN Inject 0.5 mg into the skin once a week.    . sertraline (ZOLOFT) 100 MG tablet Take 1.5 tablets (150 mg total) by mouth daily. 135 tablet 3  . Insulin Pen Needle (FIFTY50 PEN NEEDLES) 32G X 4 MM MISC Use 1 each once daily (Patient not taking: Reported on 07/26/2019)     No current facility-administered medications on file prior to visit.    No Known Allergies  Social History   Socioeconomic History  .  Marital status: Married    Spouse name: Not on file  . Number of children: 1  . Years of education: 1  . Highest education level: Not on file  Occupational History  . Occupation: [employer]    Comment: [employer]  . Occupation: Associate Professor    Comment: [employer]  Tobacco Use  . Smoking status: Former Smoker    Packs/day: 0.25    Years: 18.00    Pack years: 4.50    Start date: 07/14/2017  . Smokeless tobacco: Never Used  Vaping Use  . Vaping Use: Never used  Substance and Sexual Activity  . Alcohol use: Yes    Alcohol/week: 0.0 standard drinks    Comment: Occassional glass of wine  . Drug use: No  . Sexual activity: Yes    Partners: Male    Birth control/protection: Surgical, I.U.D.  Other Topics Concern  . Not on file  Social History Narrative   Jacqueline Padilla grew up in [place], [place]. She lives in [place] with her Husband and their son. Her husband has 3 daughters that live with them as well. She has 56 y.o son as of 06/2017. 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 She enjoys spending time with the family, outdoor activities, shopping.   Married x 16 years       Exercise - not currently   Caffeine - 1 cup daily   Social Determinants of Health   Financial Resource Strain:   . Difficulty of Paying Living Expenses:   Food Insecurity:   . Worried About Charity fundraiser in the Last Year:   . Arboriculturist in the Last Year:   Transportation Needs:   . Film/video editor (Medical):   Marland Kitchen Lack of Transportation (Non-Medical):   Physical Activity:   . Days of Exercise per Week:   . Minutes of Exercise per Session:   Stress:   . Feeling of Stress :   Social Connections:   . Frequency of Communication with Friends and Family:   . Frequency of Social Gatherings with Friends and Family:   . Attends Religious Services:   . Active Member of Clubs or  Organizations:   . Attends Archivist Meetings:   Marland Kitchen Marital Status:   Intimate Partner Violence:   . Fear of Current or Ex-Partner:   .  Emotionally Abused:   Marland Kitchen Physically Abused:   . Sexually Abused:     Family History  Problem Relation Age of Onset  . Diabetes Mother   . Hypertension Mother   . Hypertension Father   . Heart disease Father        CABG  . Cancer Maternal Grandfather        Lung cancer - non smoker  . Breast cancer Paternal Grandmother   . Bipolar disorder Sister   . Stroke Maternal Grandmother   . Bipolar disorder Cousin   . Stomach cancer Other        maternal uncle  . Ovarian cancer Neg Hx   . Colon cancer Neg Hx     The following portions of the patient's history were reviewed and updated as appropriate: allergies, current medications, past family history, past medical history, past social history, past surgical history and problem list.  Review of Systems  ROS negative except as noted above. Information obtained from patient.    Objective:   BP 122/86   Pulse 66   Ht 5' (1.524 m)   Wt 166 lb 12.8 oz (75.7 kg)   LMP 07/21/2019   BMI 32.58 kg/m   CONSTITUTIONAL: Well-developed, well-nourished female in no acute distress.   PSYCHIATRIC: Normal mood and affect. Normal behavior. Normal judgment and thought content.  Ryan: Alert and oriented to person, place, and time. Normal muscle tone coordination. No cranial nerve deficit noted.  HENT:  Normocephalic, atraumatic, External right and left ear normal.   EYES: Conjunctivae and EOM are normal. Pupils are equal and round.   NECK: Normal range of motion, supple, no masses.  Normal thyroid.   SKIN: Skin is warm and dry. No rash noted. Not diaphoretic. No erythema. No pallor. Professional tattoos.   CARDIOVASCULAR: Normal heart rate noted, regular rhythm, no murmur.  RESPIRATORY: Clear to auscultation bilaterally. Effort and breath sounds normal, no problems with respiration  noted.  BREASTS: Symmetric in size. No masses, skin changes, nipple drainage, or lymphadenopathy.  ABDOMEN: Soft, normal bowel sounds, no distention noted.  No tenderness, rebound or guarding.   PELVIC:  External Genitalia: Normal  Vagina: Normal  Cervix: Normal, IUD strings present, Pap collected  Uterus: Normal  Adnexa: Normal    MUSCULOSKELETAL: Normal range of motion. No tenderness.  No cyanosis, clubbing, or edema.  2+ distal pulses.  LYMPHATIC: No Axillary, Supraclavicular, or Inguinal Adenopathy.  Assessment:   Annual gynecologic examination 44 y.o.   Contraception: IUD, Mirena   Obesity 1   Problem List Items Addressed This Visit    None    Visit Diagnoses    Well woman exam    -  Primary   Relevant Orders   Cytology - PAP   Hepatitis B surface antigen   Hepatitis c antibody (reflex)   HIV Antibody (routine testing w rflx)   RPR   IUD check up       IUD (intrauterine device) in place       Screening for cervical cancer       Relevant Orders   Cytology - PAP   Routine screening for STI (sexually transmitted infection)       Relevant Orders   Cytology - PAP   Hepatitis B surface antigen   Hepatitis c antibody (reflex)   HIV Antibody (routine testing w rflx)   RPR      Plan:   Pap: Pap Co Test and GC/CT NAAT   Mammogram: Completed  Labs: See orders  Routine preventative health maintenance measures emphasized: Exercise/Diet/Weight control, Tobacco Warnings, Alcohol/Substance use risks, Stress Management and Peer Pressure Issues; see AVS  Reviewed red flag symptoms and when to call  Return to [place] for [facility] or sooner if needed   Dani Gobble, CNM  [facility], [facility] 07/26/19 9:01 AM

## 2019-07-26 NOTE — Progress Notes (Signed)
Pt present for annual exam. Pt stated that she is doing well no problems.  

## 2019-07-26 NOTE — Patient Instructions (Addendum)
Preventive Care 40-43 Years Old, Female Preventive care refers to visits with your health care provider and lifestyle choices that can promote health and wellness. This includes:  A yearly physical exam. This may also be called an annual well check.  Regular dental visits and eye exams.  Immunizations.  Screening for certain conditions.  Healthy lifestyle choices, such as eating a healthy diet, getting regular exercise, not using drugs or products that contain nicotine and tobacco, and limiting alcohol use. What can I expect for my preventive care visit? Physical exam Your health care provider will check your:  Height and weight. This may be used to calculate body mass index (BMI), which tells if you are at a healthy weight.  Heart rate and blood pressure.  Skin for abnormal spots. Counseling Your health care provider may ask you questions about your:  Alcohol, tobacco, and drug use.  Emotional well-being.  Home and relationship well-being.  Sexual activity.  Eating habits.  Work and work environment.  Method of birth control.  Menstrual cycle.  Pregnancy history. What immunizations do I need?  Influenza (flu) vaccine  This is recommended every year. Tetanus, diphtheria, and pertussis (Tdap) vaccine  You may need a Td booster every 10 years. Varicella (chickenpox) vaccine  You may need this if you have not been vaccinated. Zoster (shingles) vaccine  You may need this after age 60. Measles, mumps, and rubella (MMR) vaccine  You may need at least one dose of MMR if you were born in 1957 or later. You may also need a second dose. Pneumococcal conjugate (PCV13) vaccine  You may need this if you have certain conditions and were not previously vaccinated. Pneumococcal polysaccharide (PPSV23) vaccine  You may need one or two doses if you smoke cigarettes or if you have certain conditions. Meningococcal conjugate (MenACWY) vaccine  You may need this if you  have certain conditions. Hepatitis A vaccine  You may need this if you have certain conditions or if you travel or work in places where you may be exposed to hepatitis A. Hepatitis B vaccine  You may need this if you have certain conditions or if you travel or work in places where you may be exposed to hepatitis B. Haemophilus influenzae type b (Hib) vaccine  You may need this if you have certain conditions. Human papillomavirus (HPV) vaccine  If recommended by your health care provider, you may need three doses over 6 months. You may receive vaccines as individual doses or as more than one vaccine together in one shot (combination vaccines). Talk with your health care provider about the risks and benefits of combination vaccines. What tests do I need? Blood tests  Lipid and cholesterol levels. These may be checked every 5 years, or more frequently if you are over 50 years old.  Hepatitis C test.  Hepatitis B test. Screening  Lung cancer screening. You may have this screening every year starting at age 55 if you have a 30-pack-year history of smoking and currently smoke or have quit within the past 15 years.  Colorectal cancer screening. All adults should have this screening starting at age 50 and continuing until age 75. Your health care provider may recommend screening at age 45 if you are at increased risk. You will have tests every 1-10 years, depending on your results and the type of screening test.  Diabetes screening. This is done by checking your blood sugar (glucose) after you have not eaten for a while (fasting). You may have this   done every 1-3 years.  Mammogram. This may be done every 1-2 years. Talk with your health care provider about when you should start having regular mammograms. This may depend on whether you have a family history of breast cancer.  BRCA-related cancer screening. This may be done if you have a family history of breast, ovarian, tubal, or peritoneal  cancers.  Pelvic exam and Pap test. This may be done every 3 years starting at age 60. Starting at age 7, this may be done every 5 years if you have a Pap test in combination with an HPV test. Other tests  Sexually transmitted disease (STD) testing.  Bone density scan. This is done to screen for osteoporosis. You may have this scan if you are at high risk for osteoporosis. Follow these instructions at home: Eating and drinking  Eat a diet that includes fresh fruits and vegetables, whole grains, lean protein, and low-fat dairy.  Take vitamin and mineral supplements as recommended by your health care provider.  Do not drink alcohol if: ? Your health care provider tells you not to drink. ? You are pregnant, may be pregnant, or are planning to become pregnant.  If you drink alcohol: ? Limit how much you have to 0-1 drink a day. ? Be aware of how much alcohol is in your drink. In the [place] one drink equals one 12 oz bottle of beer (355 mL), one 5 oz glass of wine (148 mL), or one 1 oz glass of hard liquor (44 mL). Lifestyle  Take daily care of your teeth and gums.  Stay active. Exercise for at least 30 minutes on 5 or more days each week.  Do not use any products that contain nicotine or tobacco, such as cigarettes, e-cigarettes, and chewing tobacco. If you need help quitting, ask your health care provider.  If you are sexually active, practice safe sex. Use a condom or other form of birth control (contraception) in order to prevent pregnancy and STIs (sexually transmitted infections).  If told by your health care provider, take low-dose aspirin daily starting at age 48. What's next?  Visit your health care provider once a year for a well check visit.  Ask your health care provider how often you should have your eyes and teeth checked.  Stay up to date on all vaccines. This information is not intended to replace advice given to you by your health care provider. Make sure you  discuss any questions you have with your health care provider. Document Revised: 09/11/2017 Document Reviewed: 09/11/2017 Elsevier Patient Education  2020 [place] Breast self-awareness is knowing how your breasts look and feel. Doing breast self-awareness is important. It allows you to catch a breast problem early while it is still small and can be treated. All women should do breast self-awareness, including women who have had breast implants. Tell your doctor if you notice a change in your breasts. What you need:  A mirror.  A well-lit room. How to do a breast self-exam A breast self-exam is one way to learn what is normal for your breasts and to check for changes. To do a breast self-exam: Look for changes  1. Take off all the clothes above your waist. 2. Stand in front of a mirror in a room with good lighting. 3. Put your hands on your hips. 4. Push your hands down. 5. Look at your breasts and nipples in the mirror to see if one breast or nipple looks different from the  other. Check to see if: ? The shape of one breast is different. ? The size of one breast is different. ? There are wrinkles, dips, and bumps in one breast and not the other. 6. Look at each breast for changes in the skin, such as: ? Redness. ? Scaly areas. 7. Look for changes in your nipples, such as: ? Liquid around the nipples. ? Bleeding. ? Dimpling. ? Redness. ? A change in where the nipples are. Feel for changes  1. Lie on your back on the floor. 2. Feel each breast. To do this, follow these steps: ? Pick a breast to feel. ? Put the arm closest to that breast above your head. ? Use your other arm to feel the nipple area of your breast. Feel the area with the pads of your three middle fingers by making small circles with your fingers. For the first circle, press lightly. For the second circle, press harder. For the third circle, press even harder. ? Keep making circles with  your fingers at the different pressures as you move down your breast. Stop when you feel your ribs. ? Move your fingers a little toward the center of your body. ? Start making circles with your fingers again, this time going up until you reach your collarbone. ? Keep making up-and-down circles until you reach your armpit. Remember to keep using the three pressures. ? Feel the other breast in the same way. 3. Sit or stand in the tub or shower. 4. With soapy water on your skin, feel each breast the same way you did in step 2 when you were lying on the floor. Write down what you find Writing down what you find can help you remember what to tell your doctor. Write down:  What is normal for each breast.  Any changes you find in each breast, including: ? The kind of changes you find. ? Whether you have pain. ? Size and location of any lumps.  When you last had your menstrual period. General tips  Check your breasts every month.  If you are breastfeeding, the best time to check your breasts is after you feed your baby or after you use a breast pump.  If you get menstrual periods, the best time to check your breasts is 5-7 days after your menstrual period is over.  With time, you will become comfortable with the self-exam, and you will begin to know if there are changes in your breasts. Contact a doctor if you:  See a change in the shape or size of your breasts or nipples.  See a change in the skin of your breast or nipples, such as red or scaly skin.  Have fluid coming from your nipples that is not normal.  Find a lump or thick area that was not there before.  Have pain in your breasts.  Have any concerns about your breast health. Summary  Breast self-awareness includes looking for changes in your breasts, as well as feeling for changes within your breasts.  Breast self-awareness should be done in front of a mirror in a well-lit room.  You should check your breasts every month.  If you get menstrual periods, the best time to check your breasts is 5-7 days after your menstrual period is over.  Let your doctor know of any changes you see in your breasts, including changes in size, changes on the skin, pain or tenderness, or fluid from your nipples that is not normal. This information is not  intended to replace advice given to you by your health care provider. Make sure you discuss any questions you have with your health care provider. Document Revised: 08/19/2017 Document Reviewed: 08/19/2017 Elsevier Patient Education  [place]. Levonorgestrel intrauterine device (IUD) What is this medicine? LEVONORGESTREL IUD (LEE voe nor jes trel) is a contraceptive (birth control) device. The device is placed inside the uterus by a healthcare professional. It is used to prevent pregnancy. This device can also be used to treat heavy bleeding that occurs during your period. This medicine may be used for other purposes; ask your health care provider or pharmacist if you have questions. COMMON BRAND NAME(S): Minette [place]land What should I tell my health care provider before I take this medicine? They need to know if you have any of these conditions:  abnormal Pap smear  cancer of the breast, uterus, or cervix  diabetes  endometritis  genital or pelvic infection now or in the past  have more than one sexual partner or your partner has more than one partner  heart disease  history of an ectopic or tubal pregnancy  immune system problems  IUD in place  liver disease or tumor  problems with blood clots or take blood-thinners  seizures  use intravenous drugs  uterus of unusual shape  vaginal bleeding that has not been explained  an unusual or allergic reaction to levonorgestrel, other hormones, silicone, or polyethylene, medicines, foods, dyes, or preservatives  pregnant or trying to get pregnant  breast-feeding How should I use this  medicine? This device is placed inside the uterus by a health care professional. Talk to your pediatrician regarding the use of this medicine in children. Special care may be needed. Overdosage: If you think you have taken too much of this medicine contact a poison control center or emergency room at once. NOTE: This medicine is only for you. Do not share this medicine with others. What if I miss a dose? This does not apply. Depending on the brand of device you have inserted, the device will need to be replaced every 3 to 6 years if you wish to continue using this type of birth control. What may interact with this medicine? Do not take this medicine with any of the following medications:  amprenavir  bosentan  fosamprenavir This medicine may also interact with the following medications:  aprepitant  armodafinil  barbiturate medicines for inducing sleep or treating seizures  bexarotene  boceprevir  griseofulvin  medicines to treat seizures like carbamazepine, ethotoin, felbamate, oxcarbazepine, phenytoin, topiramate  modafinil  pioglitazone  rifabutin  rifampin  rifapentine  some medicines to treat HIV infection like atazanavir, efavirenz, indinavir, lopinavir, nelfinavir, tipranavir, ritonavir  St. John's wort  warfarin This list may not describe all possible interactions. Give your health care provider a list of all the medicines, herbs, non-prescription drugs, or dietary supplements you use. Also tell them if you smoke, drink alcohol, or use illegal drugs. Some items may interact with your medicine. What should I watch for while using this medicine? Visit your doctor or health care professional for regular check ups. See your doctor if you or your partner has sexual contact with others, becomes HIV positive, or gets a sexual transmitted disease. This product does not protect you against HIV infection (AIDS) or other sexually transmitted diseases. You can check the  placement of the IUD yourself by reaching up to the top of your vagina with clean fingers to feel the threads. Do not pull  on the threads. It is a good habit to check placement after each menstrual period. Call your doctor right away if you feel more of the IUD than just the threads or if you cannot feel the threads at all. The IUD may come out by itself. You may become pregnant if the device comes out. If you notice that the IUD has come out use a backup birth control method like condoms and call your health care provider. Using tampons will not change the position of the IUD and are okay to use during your period. This IUD can be safely scanned with magnetic resonance imaging (MRI) only under specific conditions. Before you have an MRI, tell your healthcare provider that you have an IUD in place, and which type of IUD you have in place. What side effects may I notice from receiving this medicine? Side effects that you should report to your doctor or health care professional as soon as possible:  allergic reactions like skin rash, itching or hives, swelling of the face, lips, or tongue  fever, flu-like symptoms  genital sores  high blood pressure  no menstrual period for 6 weeks during use  pain, swelling, warmth in the leg  pelvic pain or tenderness  severe or sudden headache  signs of pregnancy  stomach cramping  sudden shortness of breath  trouble with balance, talking, or walking  unusual vaginal bleeding, discharge  yellowing of the eyes or skin Side effects that usually do not require medical attention (report to your doctor or health care professional if they continue or are bothersome):  acne  breast pain  change in sex drive or performance  changes in weight  cramping, dizziness, or faintness while the device is being inserted  headache  irregular menstrual bleeding within first 3 to 6 months of use  nausea This list may not describe all possible side  effects. Call your doctor for medical advice about side effects. You may report side effects to FDA at 1-800-FDA-1088. Where should I keep my medicine? This does not apply. NOTE: This sheet is a summary. It may not cover all possible information. If you have questions about this medicine, talk to your doctor, pharmacist, or health care provider.  2020 Elsevier/Gold Standard (2017-11-11 13:22:01)

## 2019-07-27 LAB — HIV ANTIBODY (ROUTINE TESTING W REFLEX): HIV Screen 4th Generation wRfx: NONREACTIVE

## 2019-07-27 LAB — HEPATITIS B SURFACE ANTIGEN: Hepatitis B Surface Ag: NEGATIVE

## 2019-07-27 LAB — RPR: RPR Ser Ql: NONREACTIVE

## 2019-07-28 LAB — CYTOLOGY - PAP
Chlamydia: NEGATIVE
Comment: NEGATIVE
Comment: NEGATIVE
Comment: NEGATIVE
Comment: NEGATIVE
Comment: NORMAL
Diagnosis: NEGATIVE
HSV1: NEGATIVE
HSV2: NEGATIVE
High risk HPV: NEGATIVE
Neisseria Gonorrhea: NEGATIVE
Trichomonas: POSITIVE — AB

## 2019-07-30 ENCOUNTER — Other Ambulatory Visit: Payer: Self-pay | Admitting: Certified Nurse Midwife

## 2019-07-30 DIAGNOSIS — A5901 Trichomonal vulvovaginitis: Secondary | ICD-10-CM

## 2019-07-30 MED ORDER — METRONIDAZOLE 500 MG PO TABS: ORAL_TABLET | ORAL | 0 refills | Status: DC

## 2019-08-08 ENCOUNTER — Other Ambulatory Visit: Payer: Self-pay | Admitting: Internal Medicine

## 2019-08-08 DIAGNOSIS — F411 Generalized anxiety disorder: Secondary | ICD-10-CM

## 2019-08-08 MED ORDER — ALPRAZOLAM 0.25 MG PO TABS: 0.2500 mg | ORAL_TABLET | Freq: Two times a day (BID) | ORAL | 2 refills | Status: DC | PRN

## 2019-08-19 ENCOUNTER — Telehealth: Payer: Self-pay | Admitting: Internal Medicine

## 2019-08-19 DIAGNOSIS — F39 Unspecified mood [affective] disorder: Secondary | ICD-10-CM | POA: Diagnosis not present

## 2019-08-19 DIAGNOSIS — F411 Generalized anxiety disorder: Secondary | ICD-10-CM | POA: Diagnosis not present

## 2019-08-19 DIAGNOSIS — F5105 Insomnia due to other mental disorder: Secondary | ICD-10-CM | POA: Diagnosis not present

## 2019-08-19 NOTE — Telephone Encounter (Signed)
Jacqueline Padilla states a lot has happened today and she was currently in the office waiting to see Dr Maryruth Bun. States she feels as though she is having a mental crisis.   Jacqueline Padilla states that when she has time tonight she will send her medication questions on mychart.

## 2019-08-19 NOTE — Telephone Encounter (Signed)
Pt would like a call back regarding her medicines. She said she just need to touch bases with Dr. French Ana. She didn't go into detail only that she needs a call back from nurse or Dr. French Ana.

## 2019-08-20 ENCOUNTER — Other Ambulatory Visit: Payer: Self-pay | Admitting: Internal Medicine

## 2019-08-20 DIAGNOSIS — F39 Unspecified mood [affective] disorder: Secondary | ICD-10-CM

## 2019-08-20 MED ORDER — DIVALPROEX SODIUM ER 500 MG PO TB24: 1000.0000 mg | ORAL_TABLET | Freq: Every day | ORAL | Status: DC

## 2019-08-24 DIAGNOSIS — F5105 Insomnia due to other mental disorder: Secondary | ICD-10-CM | POA: Diagnosis not present

## 2019-08-24 DIAGNOSIS — F411 Generalized anxiety disorder: Secondary | ICD-10-CM | POA: Diagnosis not present

## 2019-08-24 DIAGNOSIS — F39 Unspecified mood [affective] disorder: Secondary | ICD-10-CM | POA: Diagnosis not present

## 2019-08-25 DIAGNOSIS — F3163 Bipolar disorder, current episode mixed, severe, without psychotic features: Secondary | ICD-10-CM | POA: Diagnosis not present

## 2019-08-27 DIAGNOSIS — F39 Unspecified mood [affective] disorder: Secondary | ICD-10-CM | POA: Diagnosis not present

## 2019-08-27 DIAGNOSIS — F411 Generalized anxiety disorder: Secondary | ICD-10-CM | POA: Diagnosis not present

## 2019-09-02 DIAGNOSIS — F39 Unspecified mood [affective] disorder: Secondary | ICD-10-CM | POA: Diagnosis not present

## 2019-09-02 DIAGNOSIS — F411 Generalized anxiety disorder: Secondary | ICD-10-CM | POA: Diagnosis not present

## 2019-09-02 DIAGNOSIS — F5105 Insomnia due to other mental disorder: Secondary | ICD-10-CM | POA: Diagnosis not present

## 2019-09-13 DIAGNOSIS — F5105 Insomnia due to other mental disorder: Secondary | ICD-10-CM | POA: Diagnosis not present

## 2019-09-13 DIAGNOSIS — F39 Unspecified mood [affective] disorder: Secondary | ICD-10-CM | POA: Diagnosis not present

## 2019-09-13 DIAGNOSIS — F411 Generalized anxiety disorder: Secondary | ICD-10-CM | POA: Diagnosis not present

## 2019-09-17 DIAGNOSIS — F411 Generalized anxiety disorder: Secondary | ICD-10-CM | POA: Diagnosis not present

## 2019-09-17 DIAGNOSIS — F39 Unspecified mood [affective] disorder: Secondary | ICD-10-CM | POA: Diagnosis not present

## 2019-09-17 DIAGNOSIS — F5105 Insomnia due to other mental disorder: Secondary | ICD-10-CM | POA: Diagnosis not present

## 2019-09-27 DIAGNOSIS — F5105 Insomnia due to other mental disorder: Secondary | ICD-10-CM | POA: Diagnosis not present

## 2019-09-27 DIAGNOSIS — F411 Generalized anxiety disorder: Secondary | ICD-10-CM | POA: Diagnosis not present

## 2019-09-27 DIAGNOSIS — F39 Unspecified mood [affective] disorder: Secondary | ICD-10-CM | POA: Diagnosis not present

## 2019-10-19 ENCOUNTER — Telehealth: Payer: Self-pay | Admitting: Internal Medicine

## 2019-10-19 NOTE — Telephone Encounter (Signed)
As of 10/12/19 Dr. Maryruth Bun will no longer see pt for psych reasons and pt unable to see [facility]

## 2019-10-25 ENCOUNTER — Ambulatory Visit: Payer: 59

## 2019-11-02 DIAGNOSIS — F3163 Bipolar disorder, current episode mixed, severe, without psychotic features: Secondary | ICD-10-CM | POA: Diagnosis not present

## 2019-11-02 DIAGNOSIS — F411 Generalized anxiety disorder: Secondary | ICD-10-CM | POA: Diagnosis not present

## 2019-11-10 DIAGNOSIS — F3163 Bipolar disorder, current episode mixed, severe, without psychotic features: Secondary | ICD-10-CM | POA: Diagnosis not present

## 2019-11-10 DIAGNOSIS — F411 Generalized anxiety disorder: Secondary | ICD-10-CM | POA: Diagnosis not present

## 2019-11-18 DIAGNOSIS — F3163 Bipolar disorder, current episode mixed, severe, without psychotic features: Secondary | ICD-10-CM | POA: Diagnosis not present

## 2019-11-18 DIAGNOSIS — F411 Generalized anxiety disorder: Secondary | ICD-10-CM | POA: Diagnosis not present

## 2019-12-02 DIAGNOSIS — F3163 Bipolar disorder, current episode mixed, severe, without psychotic features: Secondary | ICD-10-CM | POA: Diagnosis not present

## 2019-12-02 DIAGNOSIS — F411 Generalized anxiety disorder: Secondary | ICD-10-CM | POA: Diagnosis not present

## 2019-12-14 ENCOUNTER — Encounter: Payer: 59 | Admitting: Internal Medicine

## 2019-12-29 DIAGNOSIS — F411 Generalized anxiety disorder: Secondary | ICD-10-CM | POA: Diagnosis not present

## 2019-12-29 DIAGNOSIS — F3163 Bipolar disorder, current episode mixed, severe, without psychotic features: Secondary | ICD-10-CM | POA: Diagnosis not present

## 2020-01-26 ENCOUNTER — Telehealth: Payer: Self-pay | Admitting: Internal Medicine

## 2020-01-26 ENCOUNTER — Encounter: Payer: 59 | Admitting: Internal Medicine

## 2020-01-26 NOTE — Telephone Encounter (Signed)
Patient no-showed today's appointment; appointment was for 01/26/19 at 2:30, provider notified for review of record.  Letter sent for patient to call in and re-schedule.

## 2020-02-24 ENCOUNTER — Encounter: Payer: Self-pay | Admitting: Certified Nurse Midwife

## 2020-03-08 ENCOUNTER — Ambulatory Visit (INDEPENDENT_AMBULATORY_CARE_PROVIDER_SITE_OTHER): Payer: 59 | Admitting: Internal Medicine

## 2020-03-08 ENCOUNTER — Encounter: Payer: Self-pay | Admitting: Internal Medicine

## 2020-03-08 ENCOUNTER — Other Ambulatory Visit: Payer: Self-pay

## 2020-03-08 VITALS — BP 128/90 | HR 113 | Temp 98.1°F | Ht 60.0 in | Wt 169.4 lb

## 2020-03-08 DIAGNOSIS — G4700 Insomnia, unspecified: Secondary | ICD-10-CM | POA: Diagnosis not present

## 2020-03-08 DIAGNOSIS — Z1231 Encounter for screening mammogram for malignant neoplasm of breast: Secondary | ICD-10-CM

## 2020-03-08 DIAGNOSIS — E119 Type 2 diabetes mellitus without complications: Secondary | ICD-10-CM | POA: Diagnosis not present

## 2020-03-08 DIAGNOSIS — F419 Anxiety disorder, unspecified: Secondary | ICD-10-CM

## 2020-03-08 DIAGNOSIS — F32A Depression, unspecified: Secondary | ICD-10-CM

## 2020-03-08 LAB — POCT GLYCOSYLATED HEMOGLOBIN (HGB A1C): Hemoglobin A1C: 6.3 % — AB (ref 4.0–5.6)

## 2020-03-08 MED ORDER — TEMAZEPAM 15 MG PO CAPS: 15.0000 mg | ORAL_CAPSULE | Freq: Every evening | ORAL | 2 refills | Status: DC | PRN

## 2020-03-08 NOTE — Patient Instructions (Addendum)
Call [facility] and see who is in network for insurance  $99 per therapy  Psych $199 1st or $149 future visits  [facility] [place]  [address]  [place] [address]  [phone]

## 2020-03-08 NOTE — Progress Notes (Signed)
Chief Complaint  Patient presents with  . Medication Management  . Depression   F/u  1. Anxiety/depression/insomnia FH bipolar sister and brother anxiety/depression She saw Dr. Cephus Shelling depakote ER 1000 mg qd and she was on zoloft 150 mg qd (only helped mood temporarily), taking prn xanax 0.25 sparingly at at one point restoril 15-30 mg for sleep but 08/2019 or 09/2019 was in a dark place violent towards her husband and never been like this, had run ins with mother in law and sister in law, not herself depressed no SI. She was dismissed by Dr. Nicolasa Ducking and f/u [facility] and seeing [facility] therapist but last seen 10/2021and was given vraylar 3 mg qd and hydroxyzine 50 mg but has not tried this due to c/w side effects. She had leftover restoril 15 mg and taking bid which reduces anxieyt in the am and PM when she is having GI upset due to anxiety  She stopped zoloft 150 mg qd  She is very tearful today  She does not want to leave the house but tired of being in the house, does not want to get out of bed, does not like to be in crowds of ppl and did not drive to appt today. She has not worked and been on short now long term disability due to mental health and out of work since 08/2019 or 09/2019 PHQ 9 score 27 and GAD 7 score 21 today denies SI  Her insurance changed and [facility] is not in the network  2. DM 2 not taking ozempic per [facility] endocrine or any meds   Review of Systems  Constitutional: Negative for weight loss.  HENT: Negative for hearing loss.   Eyes: Negative for blurred vision.  Respiratory: Negative for shortness of breath.   Cardiovascular: Negative for chest pain.  Skin: Negative for rash.  Psychiatric/Behavioral: Positive for depression. Negative for substance abuse and suicidal ideas. The patient is nervous/anxious and has insomnia.    Past Medical History:  Diagnosis Date  . Anemia   . Anxiety   . Depression   . Diabetes mellitus without complication ([place])   .  Herpes    1/2 +  . History of chicken pox   . History of urinary tract infection   . Insomnia   . Iron deficiency   . Vitamin D deficiency    Past Surgical History:  Procedure Laterality Date  . DILATION AND CURETTAGE OF UTERUS  2001   s/p miscarriage  . ENDOMETRIAL ABLATION  2006   hx of abnormal pap  . TONSILLECTOMY AND ADENOIDECTOMY  1994  . TUBAL LIGATION  2006   Family History  Problem Relation Age of Onset  . Diabetes Mother   . Hypertension Mother   . Hypertension Father   . Heart disease Father        CABG  . Cancer Maternal Grandfather        Lung cancer - non smoker  . Breast cancer Paternal Grandmother   . Bipolar disorder Sister   . Anxiety disorder Brother   . Depression Brother   . Stroke Maternal Grandmother   . Bipolar disorder Cousin   . Stomach cancer Other        maternal uncle  . Ovarian cancer Neg Hx   . Colon cancer Neg Hx    Social History   Socioeconomic History  . Marital status: Married    Spouse name: Not on file  . Number of children: 1  . Years of  education: 15  . Highest education level: Not on file  Occupational History  . Occupation: [employer]    Comment: [employer]  . Occupation: Associate Professor    Comment: [employer]  Tobacco Use  . Smoking status: Former Smoker    Packs/day: 0.25    Years: 18.00    Pack years: 4.50    Start date: 07/14/2017  . Smokeless tobacco: Never Used  Vaping Use  . Vaping Use: Never used  Substance and Sexual Activity  . Alcohol use: Yes    Alcohol/week: 0.0 standard drinks    Comment: Occassional glass of wine  . Drug use: No  . Sexual activity: Yes    Partners: Male    Birth control/protection: Surgical, I.U.D.  Other Topics Concern  . Not on file  Social History Narrative   [name] grew up in [place], [place]. She lives in [place] with her Husband and their son. Her husband has 3 daughters that live with them as well. She has 5 y.o son as of 06/2017. 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 She enjoys spending time with the family, outdoor activities, shopping.   Married x 16 years       Exercise - not currently   Caffeine - 1 cup daily   Social Determinants of Health   Financial Resource Strain: Not on file  Food Insecurity: Not on file  Transportation Needs: Not on file  Physical Activity: Not on file  Stress: Not on file  Social Connections: Not on file  Intimate Partner Violence: Not on file   Current Meds  Medication Sig  . albuterol (VENTOLIN HFA) 108 (90 Base) MCG/ACT inhaler Inhale 1-2 puffs into the lungs every 6 (six) hours as needed for wheezing or shortness of breath.  . temazepam (RESTORIL) 15 MG capsule Take 1-2 capsules (15-30 mg total) by mouth at bedtime as needed for sleep.  . [DISCONTINUED] ALPRAZolam (XANAX) 0.25 MG tablet Take 1 tablet (0.25 mg total) by mouth 2 (two) times daily as needed for anxiety.   No Known Allergies Recent Results (from the past 2160 hour(s))  POCT glycosylated hemoglobin (Hb A1C)     Status: Abnormal   Collection Time: 03/08/20  3:16 PM  Result Value Ref Range   Hemoglobin A1C 6.3 (A) 4.0 - 5.6 %   HbA1c POC (<> result, manual entry)     HbA1c, POC (prediabetic range)     HbA1c, POC (controlled diabetic range)     Objective  Body mass index is 33.08 kg/m. Wt Readings from Last 3 Encounters:  03/08/20 169 lb 6.4 oz (76.8 kg)  07/26/19 166 lb 12.8 oz (75.7 kg)  04/21/19 163 lb (73.9 kg)   Temp Readings from Last 3 Encounters:  03/08/20 98.1 F (36.7 C) (Oral)  04/21/19 (!) 97.4 F (36.3 C) (Temporal)  12/03/18 (!) 97.3 F (36.3 C) (Skin)   BP Readings from Last 3 Encounters:  03/08/20 128/90  07/26/19 122/86  04/21/19 124/88   Pulse Readings from Last 3 Encounters:  03/08/20 (!) 113  07/26/19 66  04/21/19 78    Physical Exam Vitals and nursing note reviewed.  Constitutional:       Appearance: Normal appearance. She is well-developed and well-groomed. She is obese.  HENT:     Head: Normocephalic and atraumatic.  Eyes:     Conjunctiva/sclera: Conjunctivae normal.     Pupils: Pupils are  equal, round, and reactive to light.  Cardiovascular:     Rate and Rhythm: Normal rate and regular rhythm.     Heart sounds: Normal heart sounds. No murmur heard.   Pulmonary:     Effort: Pulmonary effort is normal.     Breath sounds: Normal breath sounds.  Skin:    General: Skin is warm and dry.  Neurological:     General: No focal deficit present.     Mental Status: She is alert and oriented to person, place, and time. Mental status is at baseline.     Gait: Gait normal.  Psychiatric:        Attention and Perception: Attention normal.        Mood and Affect: Mood is depressed.        Speech: Speech normal.        Behavior: Behavior normal. Behavior is cooperative.        Thought Content: Thought content normal.        Cognition and Memory: Cognition and memory normal.        Judgment: Judgment normal.     Comments: Crying on exam profusely      Assessment  Plan  Anxiety and depression FH bipolar, anxiety/depression Insomnia, unspecified type - Plan: temazepam (RESTORIL) 15 MG capsule-30 mg qhs prn  Given info psych and needs therapy  Pt to call insurance and let me know for referral if needed  Given info about:  Call [facility] and see who is in network for insurance  [facility]  [address]  [place] [address]  [phone] $99 per therapy  Psych $199 1st or $149 future visits  Diabetes mellitus without complication ([place]) - Plan: POCT glycosylated hemoglobin (Hb A1C) 6.3 today  Off ozempic  F/u [facility]   [facility] labs 06/14/19-will need comp labs at f/u Flu shot had utd 2/2 pfizer consider booster  -consider pna 23 vaccine in future  Tdaputd 12/03/18 MMR immune  Hep B immune  Pap [facility]  let ob/gyn dolast 06/01/15 neg neg hpv -will rec OB/GYN do pap [facility] due for IUD check IUD placed 04/23/18 Jeralene [facility] needs pap 712/21 +trich, neg pap neg hpv otherwise  RPR neg 07/26/19  mammo4/27/21 negative -ordered new mammo pt to call and schedule  Colonoscopy screening age 59   Congratulated on smoking cessation Healthy diet and exercise Vitamin D 3 5000 IU qd  Provider: Dr. Olivia Mackie McLean-Scocuzza-Internal Medicine

## 2020-03-09 NOTE — Addendum Note (Signed)
Addended by: Quentin Ore on: 03/09/2020 06:40 PM   Modules accepted: Orders

## 2020-03-09 NOTE — Telephone Encounter (Signed)
Spoke with the Patient and Dr French Ana McLean-Scocuzza. Informed the Patient that Dr French Ana McLean-Scocuzza will place the referral.

## 2020-04-17 IMAGING — US US EXTREM LOW VENOUS
1 series · 14 of 24 positions shown · non-contrast
Comparison: None

CLINICAL DATA: Pain x6 days, positive D-dimer, shortness of breath.
Negative CTA chest.

EXAM:
BILATERAL LOWER EXTREMITY VENOUS DOPPLER ULTRASOUND
TECHNIQUE: Gray-scale sonography with compression, as well as color and duplex
ultrasound, were performed to evaluate the deep venous system from
the level of the common femoral vein through the popliteal and
proximal calf veins.

[Series 1: us extrem low venous · 14 of 60 slices shown]
[im 1/60]
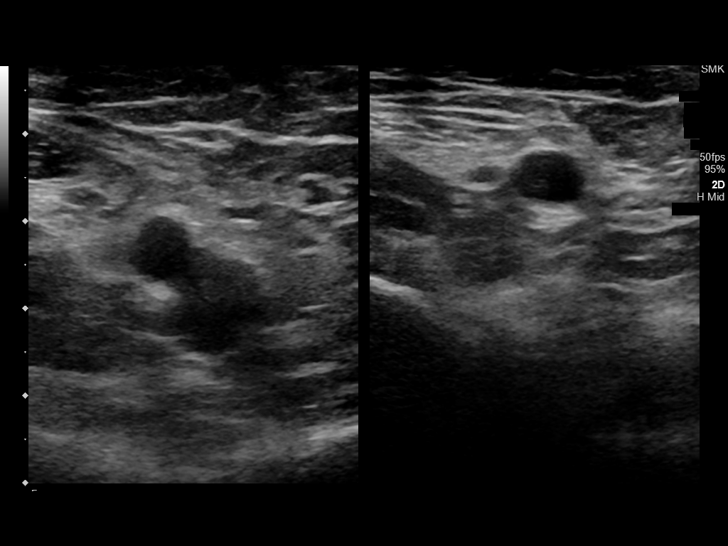
[im 6/60]
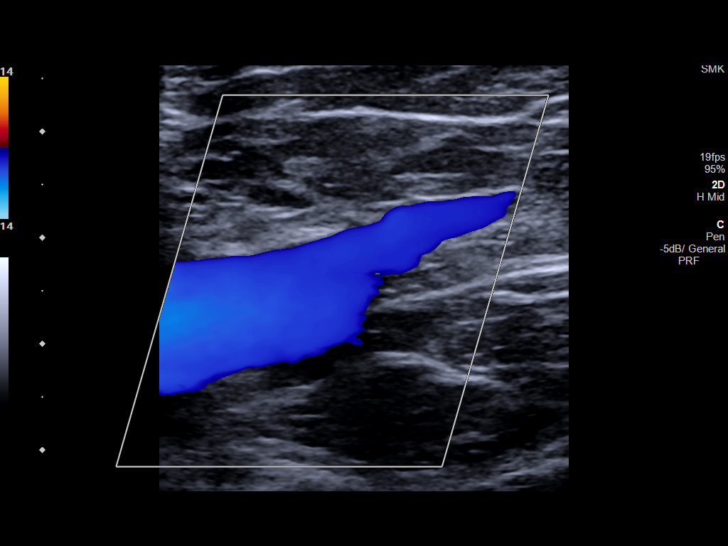
[im 11/60]
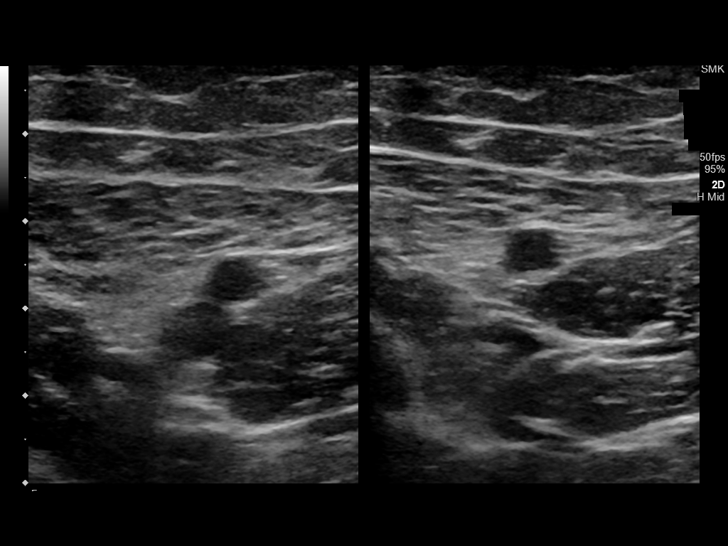
[im 16/60]
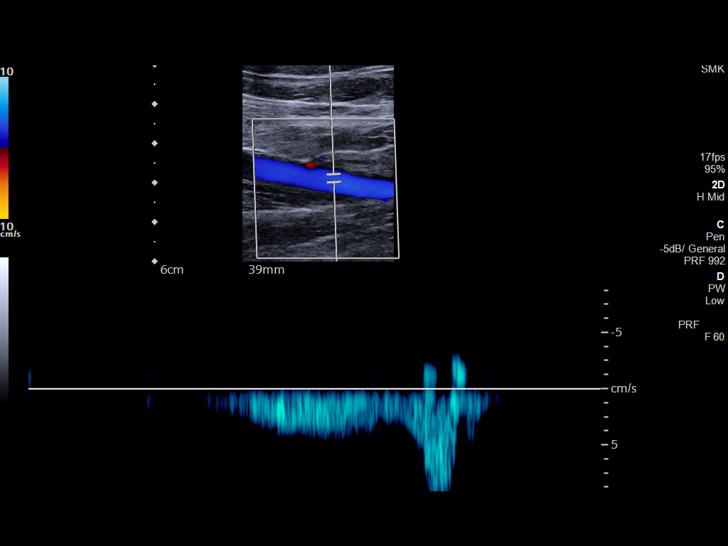
[im 18/60]
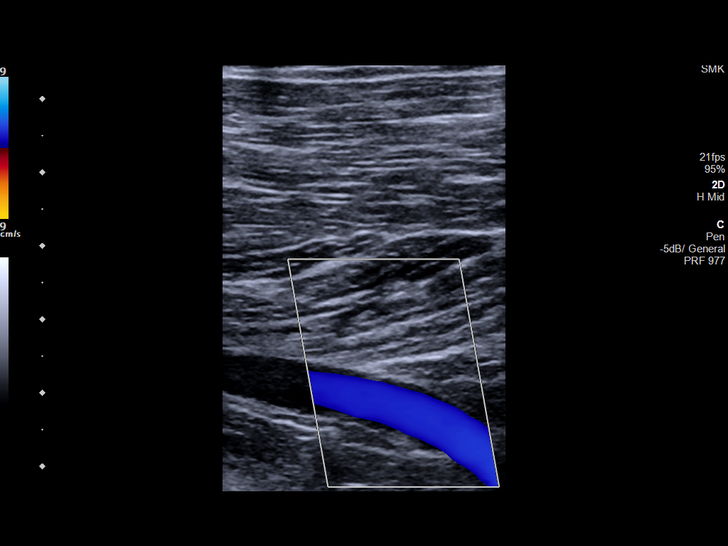
[im 24/60]
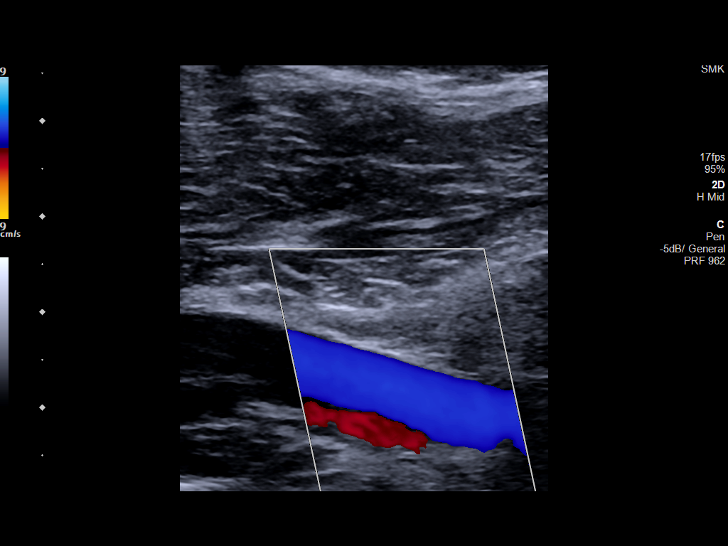
[im 29/60]
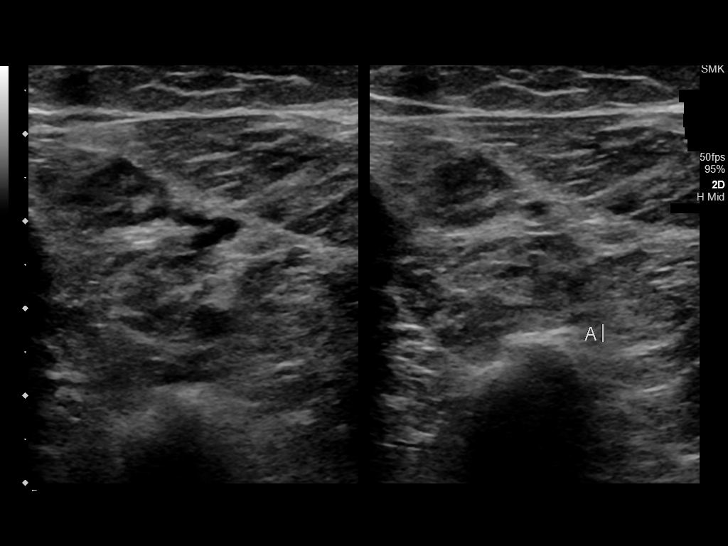
[im 31/60]
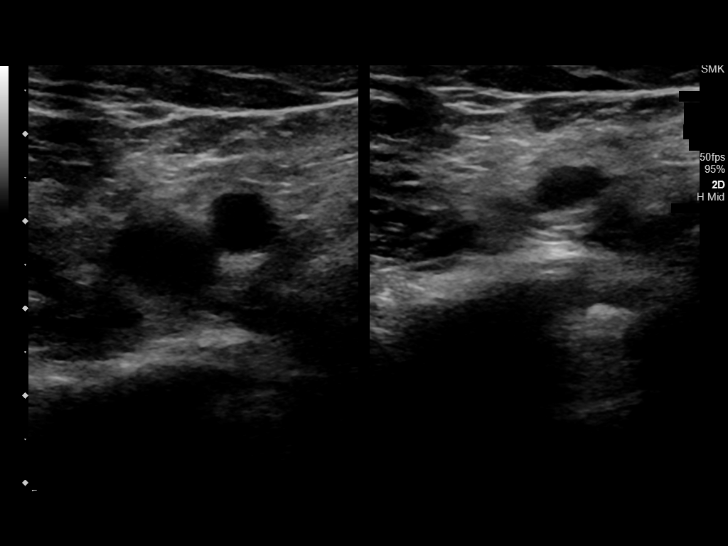
[im 36/60]
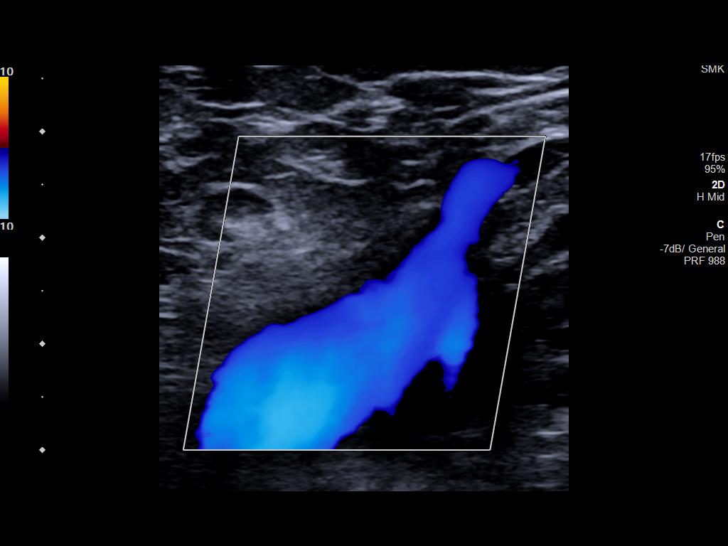
[im 42/60]
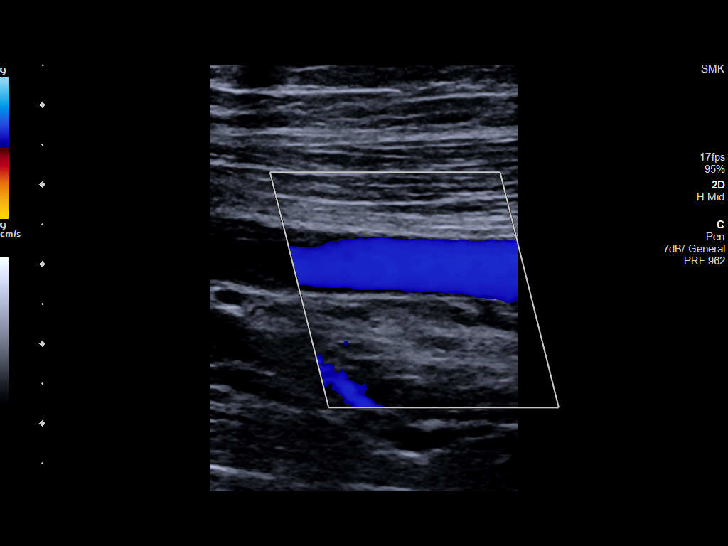
[im 47/60]
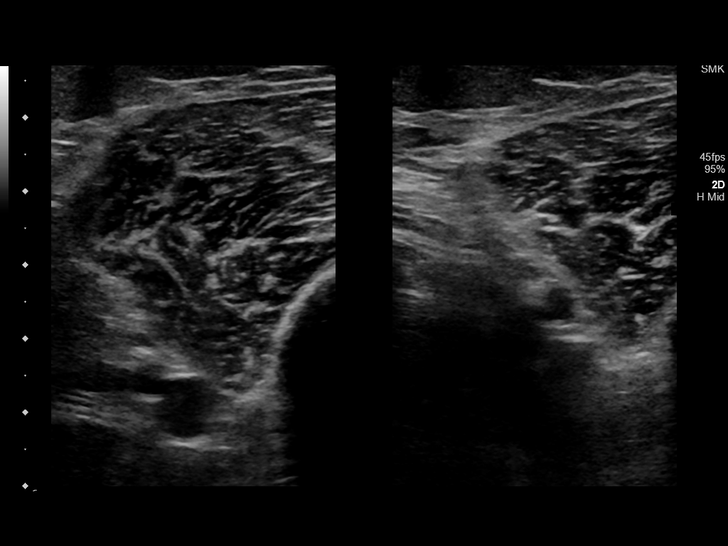
[im 49/60]
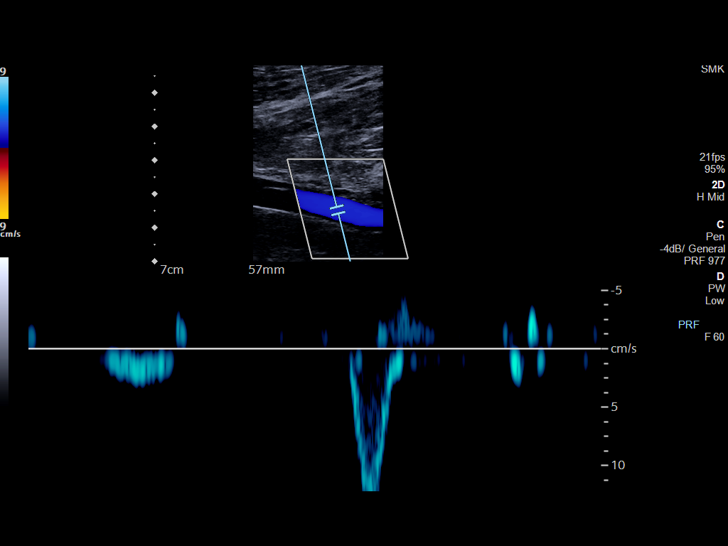
[im 54/60]
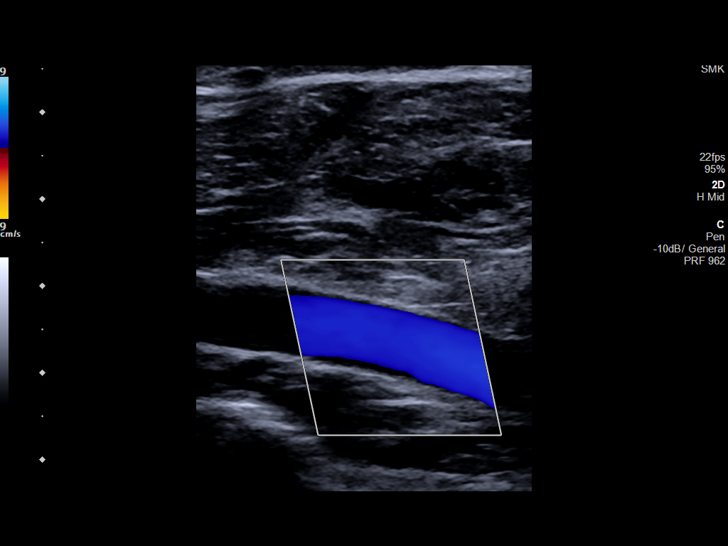
[im 60/60]
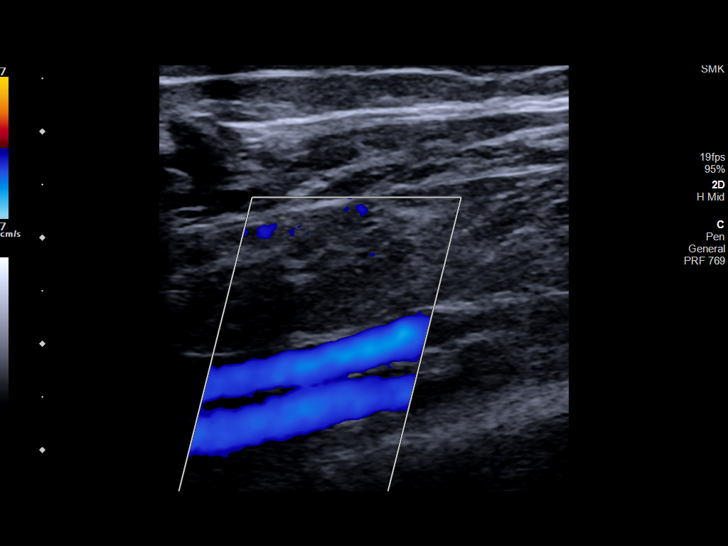

[14 of 24 positions shown; findings below may reference images not displayed]

FINDINGS: Normal compressibility of the common femoral, superficial femoral,
and popliteal veins, as well as the proximal calf veins. No filling
defects to suggest DVT on grayscale or color Doppler imaging.
Doppler waveforms show normal direction of venous flow, normal
respiratory phasicity and response to augmentation.
IMPRESSION: No femoropopliteal and no calf DVT in the visualized calf veins. If
clinical symptoms are inconsistent or if there are persistent or
worsening symptoms, further imaging (possibly involving the iliac
veins) may be warranted.

## 2020-04-17 IMAGING — CT CT ANGIO CHEST
2 of 6 series · 18 of 46 positions shown · IV contrast (APPLIED)
Comparison: None.

CLINICAL DATA: Shortness of breath with exertion, elevated D-dimer

EXAM:
CT ANGIOGRAPHY CHEST WITH CONTRAST
TECHNIQUE: Multidetector CT imaging of the chest was performed using the
standard protocol during bolus administration of intravenous
contrast. Multiplanar CT image reconstructions and MIPs were
obtained to evaluate the vascular anatomy.
CONTRAST:  75mL OMNIPAQUE IOHEXOL 350 MG/ML SOLN

[Series 5: thins · axial · 0.66mm/px · z∈[+229,+447]mm · 15 of 240 slices shown]
[im 11/240  lung]
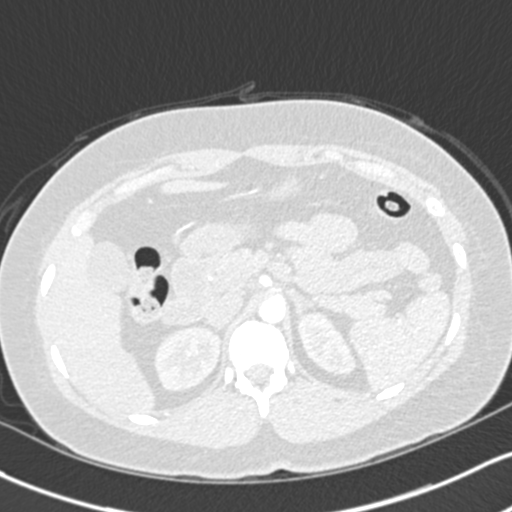
[im 32/240  soft-tissue]
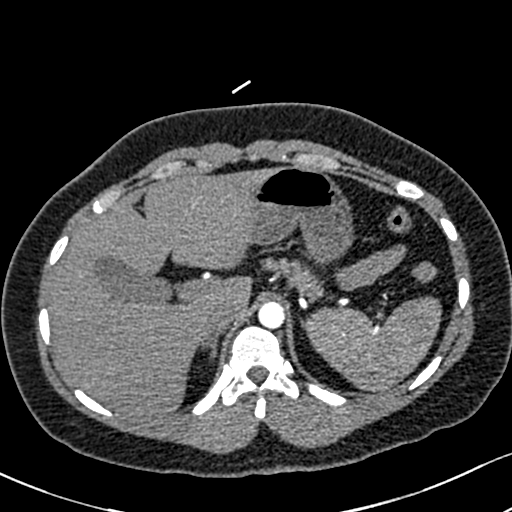
[im 42/240  lung]
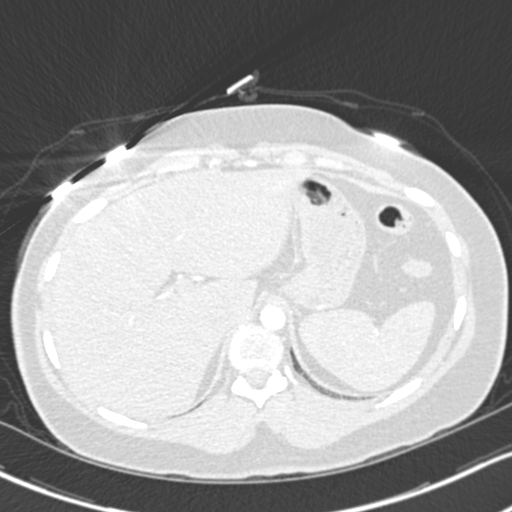
[im 63/240  soft-tissue]
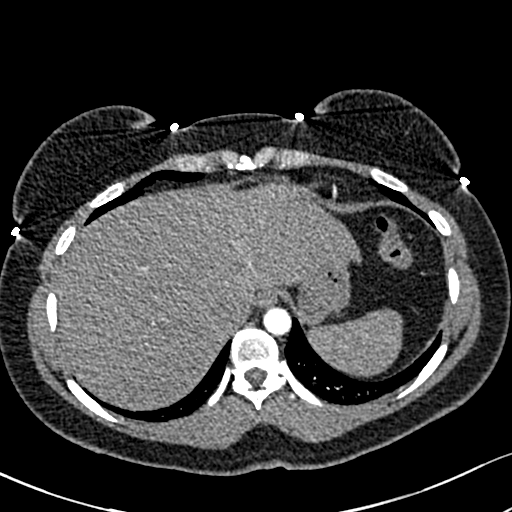
[im 73/240  lung]
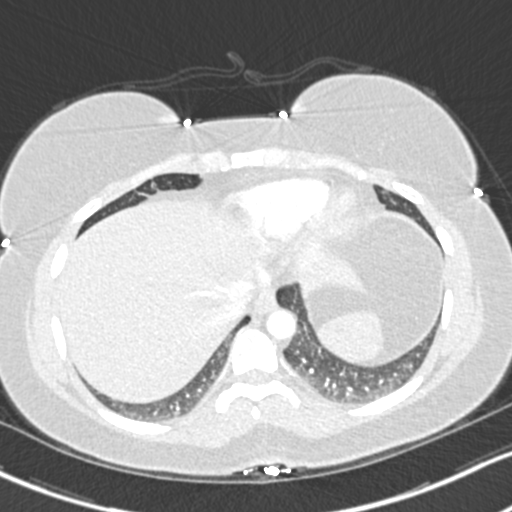
[im 94/240  soft-tissue]
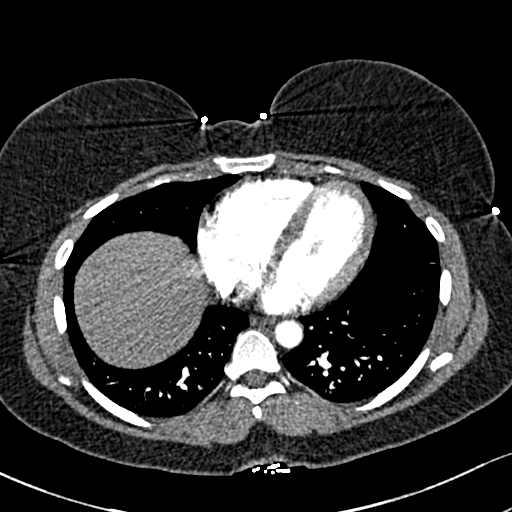
[im 104/240  lung]
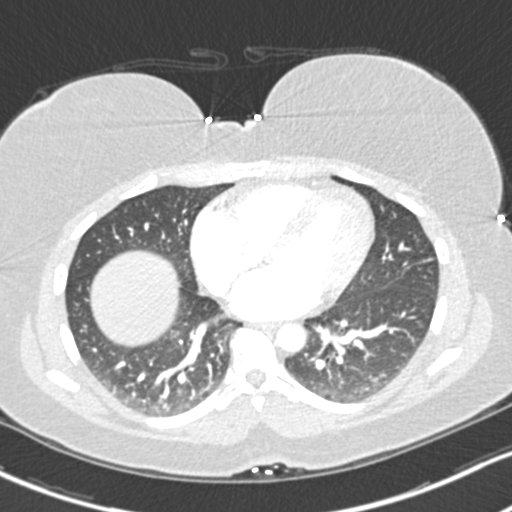
[im 125/240  soft-tissue]
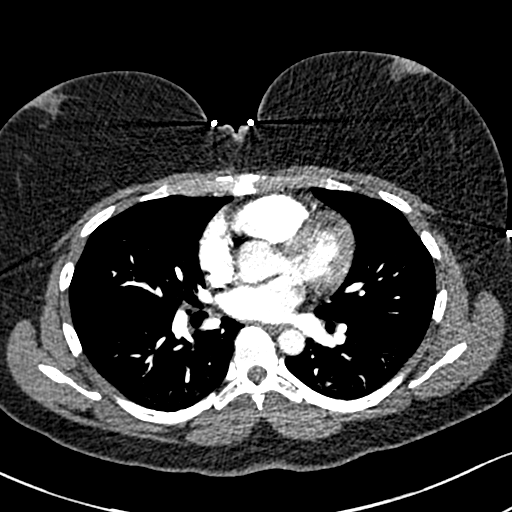
[im 136/240  lung]
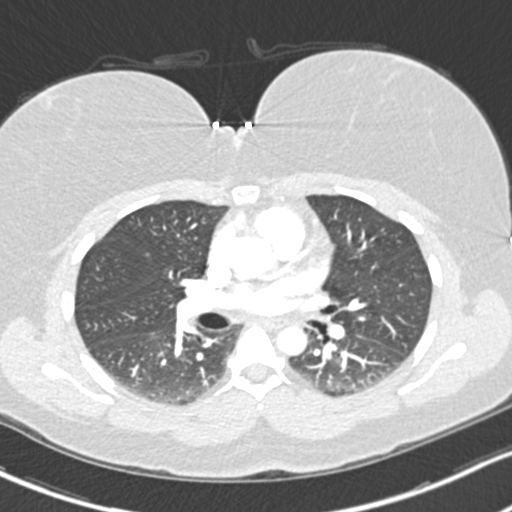
[im 146/240  soft-tissue]
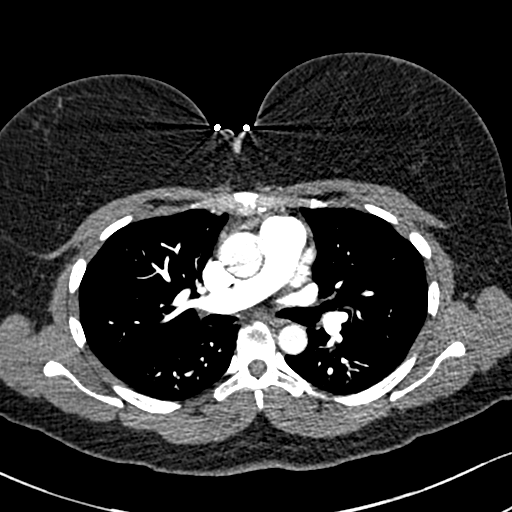
[im 167/240  lung]
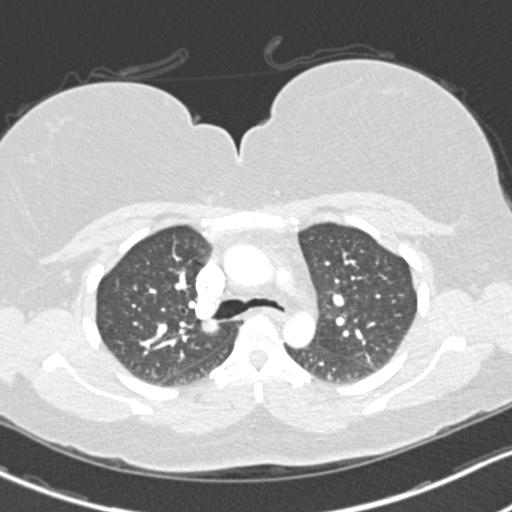
[im 177/240  soft-tissue]
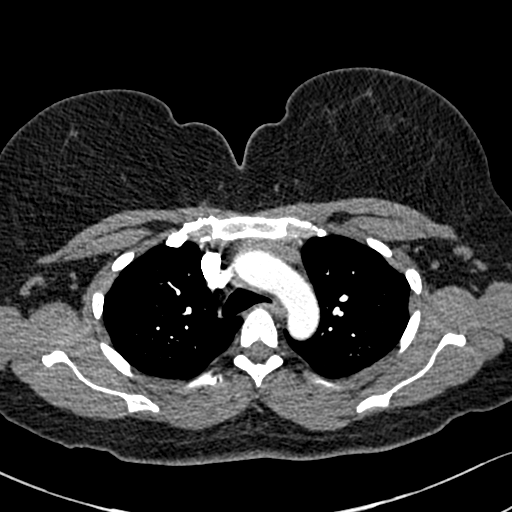
[im 198/240  lung]
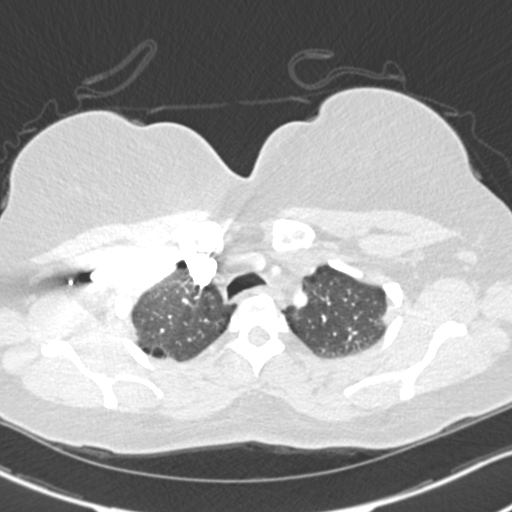
[im 208/240  soft-tissue]
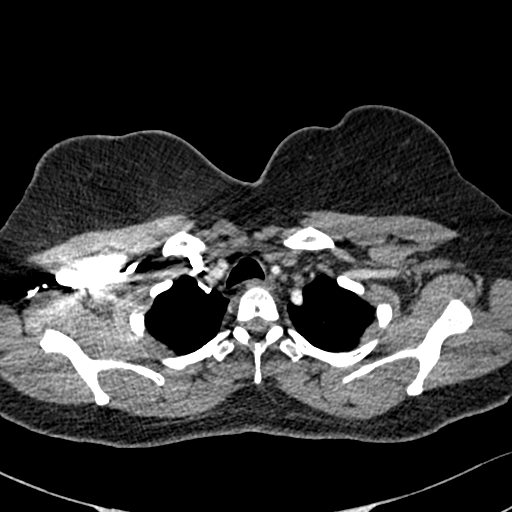
[im 229/240  lung]
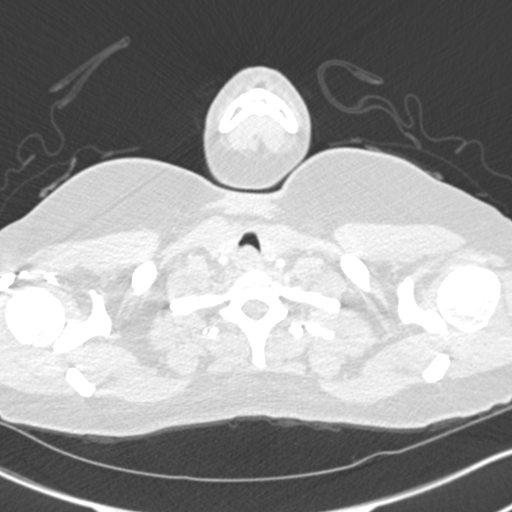

[Series 7: coronal mpr · coronal · 0.49mm/px · 3 of 75 slices shown]
[im 19/75  soft-tissue]
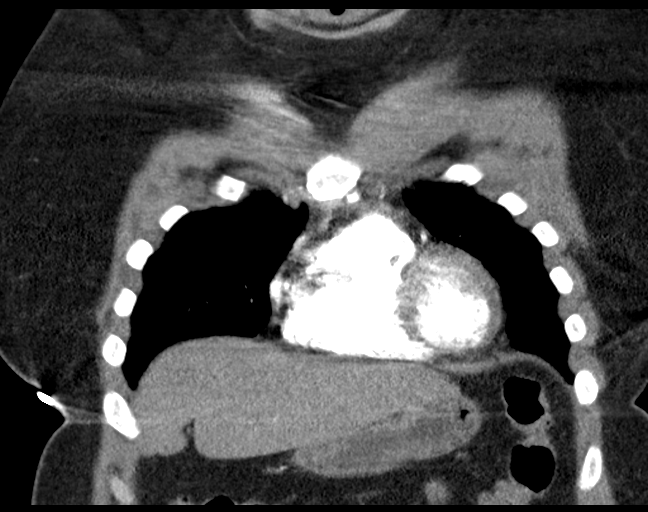
[im 38/75  soft-tissue]
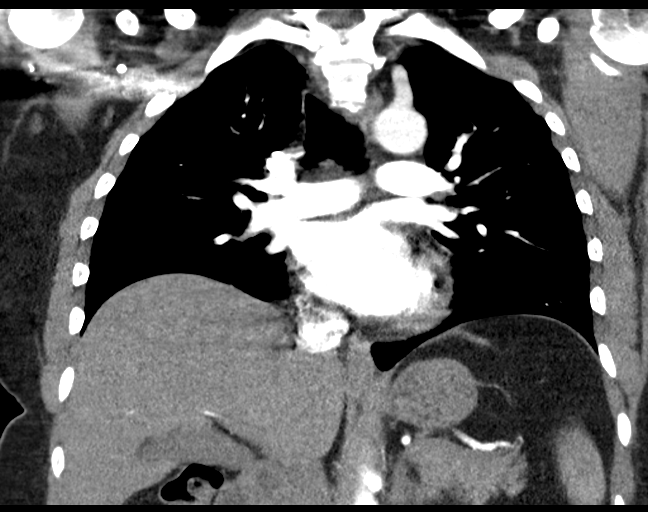
[im 56/75  soft-tissue]
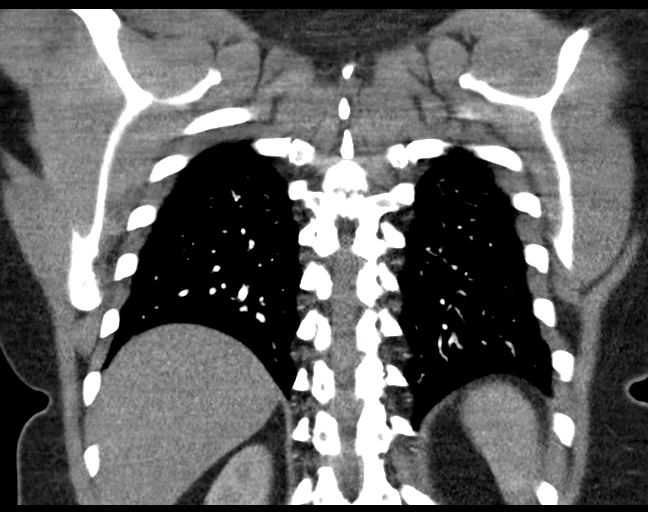

[18 of 46 positions shown; findings below may reference images not displayed]

FINDINGS: Cardiovascular: Satisfactory opacification of the pulmonary arteries
to the segmental level. No evidence of pulmonary embolism. Normal
heart size. No pericardial effusion.

Mediastinum/Nodes: No enlarged mediastinal, hilar, or axillary lymph
nodes. Thyroid gland, trachea, and esophagus demonstrate no
significant findings.

Lungs/Pleura: Lungs are clear. No pleural effusion or pneumothorax.

Upper Abdomen: No acute abnormality.

Musculoskeletal: No chest wall abnormality. No acute or significant
osseous findings.

Review of the MIP images confirms the above findings.
IMPRESSION: No evidence of acute pulmonary embolism.

## 2020-06-07 ENCOUNTER — Ambulatory Visit: Payer: 59 | Admitting: Internal Medicine

## 2020-06-07 ENCOUNTER — Telehealth: Payer: Self-pay | Admitting: Internal Medicine

## 2020-06-07 DIAGNOSIS — Z0289 Encounter for other administrative examinations: Secondary | ICD-10-CM

## 2020-06-07 NOTE — Telephone Encounter (Signed)
Patient no-showed today's appointment; appointment was for 06/07/20 at 2:00, provider notified for review of record. Letter sent for patient to call in and re-schedule.

## 2020-07-20 ENCOUNTER — Telehealth: Payer: Self-pay | Admitting: Internal Medicine

## 2020-07-20 ENCOUNTER — Encounter: Payer: 59 | Admitting: Internal Medicine

## 2020-07-20 NOTE — Telephone Encounter (Signed)
Patient no-showed today's appointment; appointment was for 07/20/20, provider notified for review of record, patient agrees to reschedule missed appointment.  Patient was sent letter after previous no show that next now show would be a fee. Patient informed when calling in to cancel that there would be a no show fee for cancelling within an hour of appointment time.   No show letter sent.

## 2020-07-31 ENCOUNTER — Encounter: Payer: 59 | Admitting: Certified Nurse Midwife

## 2020-08-01 ENCOUNTER — Encounter: Payer: 59 | Admitting: Internal Medicine

## 2020-08-03 ENCOUNTER — Encounter: Payer: Self-pay | Admitting: Internal Medicine

## 2020-08-03 ENCOUNTER — Ambulatory Visit (INDEPENDENT_AMBULATORY_CARE_PROVIDER_SITE_OTHER): Payer: 59 | Admitting: Internal Medicine

## 2020-08-03 ENCOUNTER — Other Ambulatory Visit: Payer: Self-pay

## 2020-08-03 VITALS — BP 130/84 | HR 77 | Temp 98.1°F | Ht 62.13 in | Wt 156.2 lb

## 2020-08-03 DIAGNOSIS — Z0000 Encounter for general adult medical examination without abnormal findings: Secondary | ICD-10-CM

## 2020-08-03 DIAGNOSIS — E119 Type 2 diabetes mellitus without complications: Secondary | ICD-10-CM

## 2020-08-03 DIAGNOSIS — R748 Abnormal levels of other serum enzymes: Secondary | ICD-10-CM

## 2020-08-03 DIAGNOSIS — Z1231 Encounter for screening mammogram for malignant neoplasm of breast: Secondary | ICD-10-CM | POA: Diagnosis not present

## 2020-08-03 DIAGNOSIS — F419 Anxiety disorder, unspecified: Secondary | ICD-10-CM | POA: Diagnosis not present

## 2020-08-03 DIAGNOSIS — F32A Depression, unspecified: Secondary | ICD-10-CM

## 2020-08-03 LAB — COMPREHENSIVE METABOLIC PANEL
ALT: 20 U/L (ref 0–35)
AST: 29 U/L (ref 0–37)
Albumin: 4.4 g/dL (ref 3.5–5.2)
Alkaline Phosphatase: 48 U/L (ref 39–117)
BUN: 8 mg/dL (ref 6–23)
CO2: 24 mEq/L (ref 19–32)
Calcium: 9.4 mg/dL (ref 8.4–10.5)
Chloride: 102 mEq/L (ref 96–112)
Creatinine, Ser: 0.76 mg/dL (ref 0.40–1.20)
GFR: 95.68 mL/min (ref >60.00)
Glucose, Bld: 96 mg/dL (ref 70–99)
Potassium: 3.6 mEq/L (ref 3.5–5.1)
Sodium: 138 mEq/L (ref 135–145)
Total Bilirubin: 1.4 mg/dL — ABNORMAL HIGH (ref 0.2–1.2)
Total Protein: 7.1 g/dL (ref 6.0–8.3)

## 2020-08-03 LAB — CBC WITH DIFFERENTIAL/PLATELET
Basophils Absolute: 0.1 10*3/uL (ref 0.0–0.1)
Basophils Relative: 1.4 % (ref 0.0–3.0)
Eosinophils Absolute: 0.1 10*3/uL (ref 0.0–0.7)
Eosinophils Relative: 1.7 % (ref 0.0–5.0)
HCT: 38.3 % (ref 36.0–46.0)
Hemoglobin: 12.7 g/dL (ref 12.0–15.0)
Lymphocytes Relative: 30 % (ref 12.0–46.0)
Lymphs Abs: 1.2 10*3/uL (ref 0.7–4.0)
MCHC: 33.2 g/dL (ref 30.0–36.0)
MCV: 86 fl (ref 78.0–100.0)
Monocytes Absolute: 0.3 10*3/uL (ref 0.1–1.0)
Monocytes Relative: 6.3 % (ref 3.0–12.0)
Neutro Abs: 2.5 10*3/uL (ref 1.4–7.7)
Neutrophils Relative %: 60.6 % (ref 43.0–77.0)
Platelets: 299 10*3/uL (ref 150.0–400.0)
RBC: 4.45 Mil/uL (ref 3.87–5.11)
RDW: 14 % (ref 11.5–15.5)
WBC: 4.1 10*3/uL (ref 4.0–10.5)

## 2020-08-03 LAB — LIPID PANEL
Cholesterol: 213 mg/dL — ABNORMAL HIGH (ref 0–200)
HDL: 45.5 mg/dL (ref >39.00)
LDL Cholesterol: 150 mg/dL — ABNORMAL HIGH (ref 0–99)
NonHDL: 167.27
Total CHOL/HDL Ratio: 5
Triglycerides: 86 mg/dL (ref 0.0–149.0)
VLDL: 17.2 mg/dL (ref 0.0–40.0)

## 2020-08-03 LAB — VITAMIN B12: Vitamin B-12: 287 pg/mL (ref 211–911)

## 2020-08-03 LAB — TSH: TSH: 0.91 u[IU]/mL (ref 0.35–5.50)

## 2020-08-03 LAB — VITAMIN D 25 HYDROXY (VIT D DEFICIENCY, FRACTURES): VITD: 24.9 ng/mL — ABNORMAL LOW (ref 30.00–100.00)

## 2020-08-03 LAB — HEMOGLOBIN A1C: Hgb A1c MFr Bld: 6.1 % (ref 4.6–6.5)

## 2020-08-03 NOTE — Patient Instructions (Signed)
Results for Jacqueline Padilla, Jacqueline Padilla (MRN [id number]) as of 08/03/2020 12:05  Ref. Range 03/08/2020 15:16  Hemoglobin A1C Latest Ref Range: 4.0 - 5.6 % 6.3 (A)   Diabetes Mellitus and Nutrition, Adult When you have diabetes, or diabetes mellitus, it is very important to have healthy eating habits because your blood sugar (glucose) levels are greatly affected by what you eat and drink. Eating healthy foods in the right amounts, at about the same times every day, can help you: Control your blood glucose. Lower your risk of heart disease. Improve your blood pressure. Reach or maintain a healthy weight. What can affect my meal plan? Every person with diabetes is different, and each person has different needs for a meal plan. Your health care provider may recommend that you work with a dietitian to make a meal plan that is best for you. Your meal plan may vary depending on factors such as: The calories you need. The medicines you take. Your weight. Your blood glucose, blood pressure, and cholesterol levels. Your activity level. Other health conditions you have, such as heart or kidney disease. How do carbohydrates affect me? Carbohydrates, also called carbs, affect your blood glucose level more than any other type of food. Eating carbs naturally raises the amount of glucose in your blood. Carb counting is a method for keeping track of how many carbs you eat. Counting carbs is important to keep your blood glucose at a healthy level,especially if you use insulin or take certain oral diabetes medicines. It is important to know how many carbs you can safely have in each meal. This is different for every person. Your dietitian can help you calculate how manycarbs you should have at each meal and for each snack. How does alcohol affect me? Alcohol can cause a sudden decrease in blood glucose (hypoglycemia), especially if you use insulin or take certain oral diabetes medicines. Hypoglycemia can be a  life-threatening condition. Symptoms of hypoglycemia, such as sleepiness, dizziness, and confusion, are similar to symptoms of having too much alcohol. Do not drink alcohol if: Your health care provider tells you not to drink. You are pregnant, may be pregnant, or are planning to become pregnant. If you drink alcohol: Do not drink on an empty stomach. Limit how much you use to: 0-1 drink a day for women. 0-2 drinks a day for men. Be aware of how much alcohol is in your drink. In the [place] one drink equals one 12 oz bottle of beer (355 mL), one 5 oz glass of wine (148 mL), or one 1 oz glass of hard liquor (44 mL). Keep yourself hydrated with water, diet soda, or unsweetened iced tea. Keep in mind that regular soda, juice, and other mixers may contain a lot of sugar and must be counted as carbs. What are tips for following this plan?  Reading food labels Start by checking the serving size on the "Nutrition Facts" label of packaged foods and drinks. The amount of calories, carbs, fats, and other nutrients listed on the label is based on one serving of the item. Many items contain more than one serving per package. Check the total grams (g) of carbs in one serving. You can calculate the number of servings of carbs in one serving by dividing the total carbs by 15. For example, if a food has 30 g of total carbs per serving, it would be equal to 2 servings of carbs. Check the number of grams (g) of saturated fats and trans fats in one serving.  Choose foods that have a low amount or none of these fats. Check the number of milligrams (mg) of salt (sodium) in one serving. Most people should limit total sodium intake to less than 2,300 mg per day. Always check the nutrition information of foods labeled as "low-fat" or "nonfat." These foods may be higher in added sugar or refined carbs and should be avoided. Talk to your dietitian to identify your daily goals for nutrients listed on the  label. Shopping Avoid buying canned, pre-made, or processed foods. These foods tend to be high in fat, sodium, and added sugar. Shop around the outside edge of the grocery store. This is where you will most often find fresh fruits and vegetables, bulk grains, fresh meats, and fresh dairy. Cooking Use low-heat cooking methods, such as baking, instead of high-heat cooking methods like deep frying. Cook using healthy oils, such as olive, canola, or sunflower oil. Avoid cooking with butter, cream, or high-fat meats. Meal planning Eat meals and snacks regularly, preferably at the same times every day. Avoid going long periods of time without eating. Eat foods that are high in fiber, such as fresh fruits, vegetables, beans, and whole grains. Talk with your dietitian about how many servings of carbs you can eat at each meal. Eat 4-6 oz (112-168 g) of lean protein each day, such as lean meat, chicken, fish, eggs, or tofu. One ounce (oz) of lean protein is equal to: 1 oz (28 g) of meat, chicken, or fish. 1 egg.  cup (62 g) of tofu. Eat some foods each day that contain healthy fats, such as avocado, nuts, seeds, and fish. What foods should I eat? Fruits Berries. Apples. Oranges. Peaches. Apricots. Plums. Grapes. Mango. Papaya.Pomegranate. Kiwi. Cherries. Vegetables Lettuce. Spinach. Leafy greens, including kale, chard, collard greens, and mustard greens. Beets. Cauliflower. Cabbage. Broccoli. Carrots. Green beans.Tomatoes. Peppers. Onions. Cucumbers. Brussels sprouts. Grains Whole grains, such as whole-wheat or whole-grain bread, crackers, tortillas,cereal, and pasta. Unsweetened oatmeal. Quinoa. Brown or wild rice. Meats and other proteins Seafood. Poultry without skin. Lean cuts of poultry and beef. Tofu. Nuts. Seeds. Dairy Low-fat or fat-free dairy products such as milk, yogurt, and cheese. The items listed above may not be a complete list of foods and beverages you can eat. Contact a dietitian  for more information. What foods should I avoid? Fruits Fruits canned with syrup. Vegetables Canned vegetables. Frozen vegetables with butter or cream sauce. Grains Refined white flour and flour products such as bread, pasta, snack foods, andcereals. Avoid all processed foods. Meats and other proteins Fatty cuts of meat. Poultry with skin. Breaded or fried meats. Processed meat.Avoid saturated fats. Dairy Full-fat yogurt, cheese, or milk. Beverages Sweetened drinks, such as soda or iced tea. The items listed above may not be a complete list of foods and beverages you should avoid. Contact a dietitian for more information. Questions to ask a health care provider Do I need to meet with a diabetes educator? Do I need to meet with a dietitian? What number can I call if I have questions? When are the best times to check my blood glucose? Where to find more information: American Diabetes Association: diabetes.org Academy of Nutrition and Dietetics: www.eatright.[employer] of Diabetes and Digestive and Kidney Diseases: CarFlippers.tn Association of Diabetes Care and Education Specialists: www.diabeteseducator.org Summary It is important to have healthy eating habits because your blood sugar (glucose) levels are greatly affected by what you eat and drink. A healthy meal plan will help you control your blood glucose  and maintain a healthy lifestyle. Your health care provider may recommend that you work with a dietitian to make a meal plan that is best for you. Keep in mind that carbohydrates (carbs) and alcohol have immediate effects on your blood glucose levels. It is important to count carbs and to use alcohol carefully. This information is not intended to replace advice given to you by your health care provider. Make sure you discuss any questions you have with your healthcare provider. Document Revised: 12/08/2018 Document Reviewed: 12/08/2018 Elsevier Patient Education   2021 [employer].

## 2020-08-03 NOTE — Progress Notes (Signed)
Chief Complaint  Patient presents with   Annual Exam   Form Completion   Annual and form completion to foster 45 y.o female needs tb screening for form and health assessment   She has 16 y.o son and step daughter and now this foster 15 y.o.  she still does not like large crowds  Anxiety/depression/insomnia off restoril 15 mg qhs off meds since 04/2020 and stopped seeing therapy 2 months ago mood and anxiety stable though at times irritable but exercise is helping   Dm2/prediabetes not taking meds I.e crestor glp 1 established [facility] not seen in a while will call and make appt labs today fasting     Review of Systems  Constitutional:  Negative for weight loss.  HENT:  Negative for hearing loss.   Eyes:  Negative for blurred vision.  Respiratory:  Negative for shortness of breath.   Cardiovascular:  Negative for chest pain.  Gastrointestinal:  Negative for abdominal pain.  Musculoskeletal:  Negative for falls and joint pain.  Skin:  Negative for rash.  Neurological:  Negative for headaches.  Psychiatric/Behavioral:  Negative for depression.   Past Medical History:  Diagnosis Date   Anemia    Anxiety    Depression    Diabetes mellitus without complication (HCC)    Herpes    1/2 +   History of chicken pox    History of urinary tract infection    Insomnia    Iron deficiency    Vitamin D deficiency    Past Surgical History:  Procedure Laterality Date   DILATION AND CURETTAGE OF UTERUS  2001   s/p miscarriage   ENDOMETRIAL ABLATION  2006   hx of abnormal pap   TONSILLECTOMY AND ADENOIDECTOMY  1994   TUBAL LIGATION  2006   Family History  Problem Relation Age of Onset   Diabetes Mother    Hypertension Mother    Hypertension Father    Heart disease Father        CABG   Cancer Maternal Grandfather        Lung cancer - non smoker   Breast cancer Paternal Grandmother    Bipolar disorder Sister    Anxiety disorder Brother    Depression Brother    Stroke Maternal  Grandmother    Bipolar disorder Cousin    Stomach cancer Other        maternal uncle   Ovarian cancer Neg Hx    Colon cancer Neg Hx    Social History   Socioeconomic History   Marital status: Married    Spouse name: Not on file   Number of children: 1   Years of education: 15   Highest education level: Not on file  Occupational History   Occupation: Unit Clerk    Comment: [employer]   Occupation: Associate Professor    Comment: [employer]  Tobacco Use   Smoking status: Former    Packs/day: 0.25    Years: 18.00    Pack years: 4.50    Types: Cigarettes    Start date: 07/14/2017   Smokeless tobacco: Never  Vaping Use   Vaping Use: Never used  Substance and Sexual Activity   Alcohol use: Yes    Alcohol/week: 0.0 standard drinks    Comment: Occassional glass of wine   Drug use: No   Sexual activity: Yes    Partners: Male    Birth control/protection: Surgical, I.U.D.  Other Topics Concern   Not on file  Social History Narrative  [name] grew up in [place], [place]. She lives in [place] with her Husband and their son. Her husband has 3 daughters that live with them as well. She has 7 y.o son as of 06/2017. 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 She enjoys spending time with the family, outdoor activities, shopping.   Married x 16 years       Exercise - not currently   Caffeine - 1 cup daily   Social Determinants of Health   Financial Resource Strain: Not on file  Food Insecurity: Not on file  Transportation Needs: Not on file  Physical Activity: Not on file  Stress: Not on file  Social Connections: Not on file  Intimate Partner Violence: Not on file   No outpatient medications have been marked as taking for the 08/03/20 encounter (Office Visit) with McLean-Scocuzza, Nino Glow, MD.   No Known Allergies No results found for this or any previous visit (from the past  2160 hour(s)). Objective  Body mass index is 28.45 kg/m. Wt Readings from Last 3 Encounters:  08/03/20 156 lb 3.2 oz (70.9 kg)  03/08/20 169 lb 6.4 oz (76.8 kg)  07/26/19 166 lb 12.8 oz (75.7 kg)   Temp Readings from Last 3 Encounters:  08/03/20 98.1 F (36.7 C) (Oral)  03/08/20 98.1 F (36.7 C) (Oral)  04/21/19 (!) 97.4 F (36.3 C) (Temporal)   BP Readings from Last 3 Encounters:  08/03/20 130/84  03/08/20 128/90  07/26/19 122/86   Pulse Readings from Last 3 Encounters:  08/03/20 77  03/08/20 (!) 113  07/26/19 66    Physical Exam Vitals and nursing note reviewed.  Constitutional:      Appearance: Normal appearance. She is well-developed and well-groomed.  HENT:     Head: Normocephalic and atraumatic.  Eyes:     Conjunctiva/sclera: Conjunctivae normal.     Pupils: Pupils are equal, round, and reactive to light.  Cardiovascular:     Rate and Rhythm: Normal rate and regular rhythm.     Heart sounds: Normal heart sounds. No murmur heard. Pulmonary:     Effort: Pulmonary effort is normal.     Breath sounds: Normal breath sounds.  Chest:     Chest wall: No mass.  Breasts:    Breasts are symmetrical.     Right: Normal. No axillary adenopathy.     Left: Normal. No axillary adenopathy.  Lymphadenopathy:     Upper Body:     Right upper body: No axillary adenopathy.     Left upper body: No axillary adenopathy.  Skin:    General: Skin is warm and dry.  Neurological:     General: No focal deficit present.     Mental Status: She is alert and oriented to person, place, and time. Mental status is at baseline.     Gait: Gait normal.  Psychiatric:        Attention and Perception: Attention and perception normal.        Mood and Affect: Mood and affect normal.        Speech: Speech normal.        Behavior: Behavior normal. Behavior is cooperative.        Thought Content: Thought content normal.        Cognition and Memory: Cognition and memory normal.        Judgment:  Judgment normal.    Assessment  Plan  Annual physical exam - Plan: Comprehensive metabolic panel, Lipid panel, CBC w/Diff, Urinalysis, Routine w reflex microscopic, TSH, Iron, TIBC and Ferritin Panel, Vitamin D (25 hydroxy), QuantiFERON-TB Gold Plus, Vitamin B12 HM Flu shot had utd 2/2 pfizer consider booster  -consider pna 23 vaccine in future    Tdap utd 12/03/18   MMR immune Hep B immune    Pap at will let ob/gyn do last 06/01/15 neg neg hpv  -will rec OB/GYN do pap [facility] and due for IUD check IUD placed 04/23/18 Jeralene Huff [facility]  pap 712/21 +trich, neg pap neg hpv otherwise [facility] will follow   RPR neg 07/26/19   mammo 05/11/19 negative - ordered new mammo pt to call and schedule    Colonoscopy screening age 35     Congratulated on smoking cessation  Healthy diet and exercise  Vitamin D 3 5000 IU qd    Diabetes mellitus without complication ([place]) - Plan: Hemoglobin A1c, Microalbumin / creatinine urine ratio  Anxiety and depression stable off meds and no therapy since 04/2020 and 06/2020   Provider: Dr. Olivia Mackie McLean-Scocuzza-[facility]

## 2020-08-04 ENCOUNTER — Encounter: Payer: 59 | Admitting: Certified Nurse Midwife

## 2020-08-04 LAB — URINALYSIS, ROUTINE W REFLEX MICROSCOPIC
Bilirubin Urine: NEGATIVE
Glucose, UA: NEGATIVE
Hgb urine dipstick: NEGATIVE
Hyaline Cast: NONE SEEN /LPF
Nitrite: NEGATIVE
Protein, ur: NEGATIVE
RBC / HPF: NONE SEEN /HPF (ref 0–2)
Specific Gravity, Urine: 1.016 (ref 1.001–1.035)
pH: 6 (ref 5.0–8.0)

## 2020-08-04 LAB — MICROALBUMIN / CREATININE URINE RATIO
Creatinine, Urine: 195 mg/dL (ref 20–275)
Microalb Creat Ratio: 8 mcg/mg creat (ref <30)
Microalb, Ur: 1.6 mg/dL

## 2020-08-04 LAB — MICROSCOPIC MESSAGE

## 2020-08-06 ENCOUNTER — Other Ambulatory Visit: Payer: Self-pay | Admitting: Internal Medicine

## 2020-08-06 ENCOUNTER — Encounter: Payer: Self-pay | Admitting: Internal Medicine

## 2020-08-06 DIAGNOSIS — J9801 Acute bronchospasm: Secondary | ICD-10-CM

## 2020-08-06 DIAGNOSIS — J45909 Unspecified asthma, uncomplicated: Secondary | ICD-10-CM

## 2020-08-06 DIAGNOSIS — R7303 Prediabetes: Secondary | ICD-10-CM | POA: Insufficient documentation

## 2020-08-06 LAB — QUANTIFERON-TB GOLD PLUS
Mitogen-NIL: >10 IU/mL
NIL: 0.04 IU/mL
QuantiFERON-TB Gold Plus: NEGATIVE
TB1-NIL: 0.02 IU/mL
TB2-NIL: 0.02 IU/mL

## 2020-08-06 LAB — IRON,TIBC AND FERRITIN PANEL
%SAT: 34 % (calc) (ref 16–45)
Ferritin: 95 ng/mL (ref 16–232)
Iron: 125 ug/dL (ref 40–190)
TIBC: 367 mcg/dL (calc) (ref 250–450)

## 2020-08-06 MED ORDER — ALBUTEROL SULFATE HFA 108 (90 BASE) MCG/ACT IN AERS: 1.0000 | INHALATION_SPRAY | Freq: Four times a day (QID) | RESPIRATORY_TRACT | 11 refills | Status: DC | PRN

## 2020-08-06 MED ORDER — ROSUVASTATIN CALCIUM 5 MG PO TABS: 5.0000 mg | ORAL_TABLET | Freq: Every day | ORAL | 3 refills | Status: DC

## 2020-08-07 NOTE — Progress Notes (Signed)
Left message to return call.  Faxed.

## 2020-08-14 ENCOUNTER — Telehealth: Payer: Self-pay

## 2020-08-14 NOTE — Telephone Encounter (Signed)
-----   Message from Bevelyn Buckles, MD sent at 08/06/2020 11:36 PM EDT ----- Fax labs [facility] endocrine pt needs appt for f/u   Iron normal with history continue multivitamin with iron daily otc  TB blood test negative inform pt to check negative on form filled out if needs copy its on my chart  Urine cloudy and ketones likely due to fasting increase water intake 55-64 ounces daily  Urine negative protein Vitamin D low rec D3 4000 to 5000 IU daily otc  Bilirubin slightly elevated  -does she want to do US abdomen to work this up ? Cholesterol elevated rec take crestor 5 mg at night with healthy diet and exercise  Blood cts normal  B12 borderline low rec take multivitamin as above  Thyroid lab normal  A1C 6.1 prediabetic range w/o meds rec healthy diet and exercise

## 2020-08-14 NOTE — Telephone Encounter (Signed)
Left message to return call.  Letter sent to call back in

## 2020-08-17 ENCOUNTER — Encounter: Payer: 59 | Admitting: Certified Nurse Midwife

## 2020-08-22 NOTE — Telephone Encounter (Signed)
Patient informed and verbalized understanding.   Patient states she has been drinking and wanted to know if she still needs an ultrasound or can she cut back.   Patient has started cholesterol medication. Patient will continue taking multivitamin and start vitamin D3

## 2020-08-25 ENCOUNTER — Telehealth: Payer: Self-pay | Admitting: Internal Medicine

## 2020-08-25 NOTE — Telephone Encounter (Signed)
Cut back stop drinking  and rec US abdomen ordered Have pt call and sch mammogram order in

## 2020-08-25 NOTE — Telephone Encounter (Signed)
LMTCB

## 2020-08-25 NOTE — Addendum Note (Signed)
Addended by: Quentin Ore on: 08/25/2020 09:07 AM   Modules accepted: Orders

## 2020-08-25 NOTE — Telephone Encounter (Signed)
Lft pt vm to call ofc to sch US. thanks ?

## 2020-08-29 ENCOUNTER — Telehealth: Payer: Self-pay | Admitting: Internal Medicine

## 2020-08-29 NOTE — Telephone Encounter (Signed)
Left message to return call. ° °Mychart message sent  °

## 2020-08-29 NOTE — Telephone Encounter (Signed)
Lft pt vm to call ofc to sch US. thanks ?

## 2020-11-23 ENCOUNTER — Encounter: Payer: 59 | Admitting: Obstetrics and Gynecology

## 2020-11-24 ENCOUNTER — Encounter: Payer: Self-pay | Admitting: Obstetrics and Gynecology

## 2020-11-24 NOTE — Patient Instructions (Incomplete)
Breast Self-Awareness °Breast self-awareness is knowing how your breasts look and feel. Doing breast self-awareness is important. It allows you to catch a breast problem early while it is still small and can be treated. All women should do breast self-awareness, including women who have had breast implants. Tell your doctor if you notice a change in your breasts. °What you need: °A mirror. °A well-lit room. °How to do a breast self-exam °A breast self-exam is one way to learn what is normal for your breasts and to check for changes. To do a breast self-exam: °Look for changes ° °Take off all the clothes above your waist. °Stand in front of a mirror in a room with good lighting. °Put your hands on your hips. °Push your hands down. °Look at your breasts and nipples in the mirror to see if one breast or nipple looks different from the other. Check to see if: °The shape of one breast is different. °The size of one breast is different. °There are wrinkles, dips, and bumps in one breast and not the other. °Look at each breast for changes in the skin, such as: °Redness. °Scaly areas. °Look for changes in your nipples, such as: °Liquid around the nipples. °Bleeding. °Dimpling. °Redness. °A change in where the nipples are. °Feel for changes ° °Lie on your back on the floor. °Feel each breast. To do this, follow these steps: °Pick a breast to feel. °Put the arm closest to that breast above your head. °Use your other arm to feel the nipple area of your breast. Feel the area with the pads of your three middle fingers by making small circles with your fingers. For the first circle, press lightly. For the second circle, press harder. For the third circle, press even harder. °Keep making circles with your fingers at the different pressures as you move down your breast. Stop when you feel your ribs. °Move your fingers a little toward the center of your body. °Start making circles with your fingers again, this time going up until  you reach your collarbone. °Keep making up-and-down circles until you reach your armpit. Remember to keep using the three pressures. °Feel the other breast in the same way. °Sit or stand in the tub or shower. °With soapy water on your skin, feel each breast the same way you did in step 2 when you were lying on the floor. °Write down what you find °Writing down what you find can help you remember what to tell your doctor. Write down: °What is normal for each breast. °Any changes you find in each breast, including: °The kind of changes you find. °Whether you have pain. °Size and location of any lumps. °When you last had your menstrual period. °General tips °Check your breasts every month. °If you are breastfeeding, the best time to check your breasts is after you feed your baby or after you use a breast pump. °If you get menstrual periods, the best time to check your breasts is 5-7 days after your menstrual period is over. °With time, you will become comfortable with the self-exam, and you will begin to know if there are changes in your breasts. °Contact a doctor if you: °See a change in the shape or size of your breasts or nipples. °See a change in the skin of your breast or nipples, such as red or scaly skin. °Have fluid coming from your nipples that is not normal. °Find a lump or thick area that was not there before. °Have pain in   your breasts. °Have any concerns about your breast health. °Summary °Breast self-awareness includes looking for changes in your breasts, as well as feeling for changes within your breasts. °Breast self-awareness should be done in front of a mirror in a well-lit room. °You should check your breasts every month. If you get menstrual periods, the best time to check your breasts is 5-7 days after your menstrual period is over. °Let your doctor know of any changes you see in your breasts, including changes in size, changes on the skin, pain or tenderness, or fluid from your nipples that is not  normal. °This information is not intended to replace advice given to you by your health care provider. Make sure you discuss any questions you have with your health care provider. °Document Revised: 08/19/2017 Document Reviewed: 08/19/2017 °Elsevier Patient Education © 2022 Elsevier Inc. °Preventive Care 21-39 Years Old, Female °Preventive care refers to lifestyle choices and visits with your health care provider that can promote health and wellness. Preventive care visits are also called wellness exams. °What can I expect for my preventive care visit? °Counseling °During your preventive care visit, your health care provider may ask about your: °Medical history, including: °Past medical problems. °Family medical history. °Pregnancy history. °Current health, including: °Menstrual cycle. °Method of birth control. °Emotional well-being. °Home life and relationship well-being. °Sexual activity and sexual health. °Lifestyle, including: °Alcohol, nicotine or tobacco, and drug use. °Access to firearms. °Diet, exercise, and sleep habits. °Work and work environment. °Sunscreen use. °Safety issues such as seatbelt and bike helmet use. °Physical exam °Your health care provider may check your: °Height and weight. These may be used to calculate your BMI (body mass index). BMI is a measurement that tells if you are at a healthy weight. °Waist circumference. This measures the distance around your waistline. This measurement also tells if you are at a healthy weight and may help predict your risk of certain diseases, such as type 2 diabetes and high blood pressure. °Heart rate and blood pressure. °Body temperature. °Skin for abnormal spots. °What immunizations do I need? °Vaccines are usually given at various ages, according to a schedule. Your health care provider will recommend vaccines for you based on your age, medical history, and lifestyle or other factors, such as travel or where you work. °What tests do I  need? °Screening °Your health care provider may recommend screening tests for certain conditions. This may include: °Pelvic exam and Pap test. °Lipid and cholesterol levels. °Diabetes screening. This is done by checking your blood sugar (glucose) after you have not eaten for a while (fasting). °Hepatitis B test. °Hepatitis C test. °HIV (human immunodeficiency virus) test. °STI (sexually transmitted infection) testing, if you are at risk. °BRCA-related cancer screening. This may be done if you have a family history of breast, ovarian, tubal, or peritoneal cancers. °Talk with your health care provider about your test results, treatment options, and if necessary, the need for more tests. °Follow these instructions at home: °Eating and drinking ° °Eat a healthy diet that includes fresh fruits and vegetables, whole grains, lean protein, and low-fat dairy products. °Take vitamin and mineral supplements as recommended by your health care provider. °Do not drink alcohol if: °Your health care provider tells you not to drink. °You are pregnant, may be pregnant, or are planning to become pregnant. °If you drink alcohol: °Limit how much you have to 0-1 drink a day. °Know how much alcohol is in your drink. In the [place] one drink equals one 12 oz   bottle of beer (355 mL), one 5 oz glass of wine (148 mL), or one 1 oz glass of hard liquor (44 mL). Lifestyle Brush your teeth every morning and night with fluoride toothpaste. Floss one time each day. Exercise for at least 30 minutes 5 or more days each week. Do not use any products that contain nicotine or tobacco. These products include cigarettes, chewing tobacco, and vaping devices, such as e-cigarettes. If you need help quitting, ask your health care provider. Do not use drugs. If you are sexually active, practice safe sex. Use a condom or other form of protection to prevent STIs. If you do not wish to become pregnant, use a form of birth control. If you plan to become  pregnant, see your health care provider for a prepregnancy visit. Find healthy ways to manage stress, such as: Meditation, yoga, or listening to music. Journaling. Talking to a trusted person. Spending time with friends and family. Minimize exposure to UV radiation to reduce your risk of skin cancer. Safety Always wear your seat belt while driving or riding in a vehicle. Do not drive: If you have been drinking alcohol. Do not ride with someone who has been drinking. If you have been using any mind-altering substances or drugs. While texting. When you are tired or distracted. Wear a helmet and other protective equipment during sports activities. If you have firearms in your house, make sure you follow all gun safety procedures. Seek help if you have been physically or sexually abused. What's next? Go to your health care provider once a year for an annual wellness visit. Ask your health care provider how often you should have your eyes and teeth checked. Stay up to date on all vaccines. This information is not intended to replace advice given to you by your health care provider. Make sure you discuss any questions you have with your health care provider. Document Revised: 06/28/2020 Document Reviewed: 06/28/2020 Elsevier Patient Education  [place].

## 2020-11-24 NOTE — Progress Notes (Deleted)
GYNECOLOGY ANNUAL PHYSICAL EXAM PROGRESS NOTE  Subjective:    Jacqueline Padilla is a 45 y.o. G59P1011 female who presents for an annual exam. The patient has no complaints today. The patient {is/is not/has never been:13135} sexually active. The patient participates in regular exercise: {yes/no/not asked:9010}. Has the patient ever been transfused or tattooed?: {yes/no/not asked:9010}. The patient reports that there {is/is not:9024} domestic violence in her life.    Menstrual History: Menarche age: *** No LMP recorded. (Menstrual status: IUD).     Gynecologic History:  Contraception: IUD History of STI's:  Last Pap: 07/26/2019. Results were: normal.  ***Denies/Notes h/o abnormal pap smears. Last mammogram: 05/11/2019. Results were: normal       OB History  Gravida Para Term Preterm AB Living  _0 0 1 1  SAB IAB Ectopic Multiple Live Births  1 0 0 0 1    # Outcome Date GA Lbr Len/2nd Weight Sex Delivery Anes PTL Lv  2 Term 2003   7 lb 2.6 oz (3.248 kg) M Vag-Spont   LIV  1 SAB 2001            Past Medical History:  Diagnosis Date   Anemia    Anxiety    Depression    Diabetes mellitus without complication ([place])    Herpes    1/2 +   History of chicken pox    History of urinary tract infection    Insomnia    Iron deficiency    Vitamin D deficiency     Past Surgical History:  Procedure Laterality Date   DILATION AND CURETTAGE OF UTERUS  2001   s/p miscarriage   ENDOMETRIAL ABLATION  2006   hx of abnormal pap   TONSILLECTOMY AND ADENOIDECTOMY  1994   TUBAL LIGATION  2006    Family History  Problem Relation Age of Onset   Diabetes Mother    Hypertension Mother    Hypertension Father    Heart disease Father        CABG   Cancer Maternal Grandfather        Lung cancer - non smoker   Breast cancer Paternal Grandmother    Bipolar disorder Sister    Anxiety disorder Brother    Depression Brother    Stroke Maternal Grandmother    Bipolar disorder  Cousin    Stomach cancer Other        maternal uncle   Ovarian cancer Neg Hx    Colon cancer Neg Hx     Social History   Socioeconomic History   Marital status: Married    Spouse name: Not on file   Number of children: 1   Years of education: 15   Highest education level: Not on file  Occupational History   Occupation: Unit Clerk    Comment: [employer]   Occupation: Associate Professor    Comment: [employer]  Tobacco Use   Smoking status: Former    Packs/day: 0.25    Years: 18.00    Pack years: 4.50    Types: Cigarettes    Start date: 07/14/2017   Smokeless tobacco: Never  Vaping Use   Vaping Use: Never used  Substance and Sexual Activity   Alcohol use: Yes    Alcohol/week: 0.0 standard drinks    Comment: Occassional glass of wine   Drug use: No   Sexual activity: Yes    Partners: Male    Birth control/protection: Surgical, I.U.D.  Other Topics Concern  Not on file  Social History Narrative   [name] grew up in [place], [place]. She lives in [place] with her Husband and their son. Her husband has 3 daughters that live with them as well. She has 25 y.o son as of 06/2017. 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 She enjoys spending time with the family, outdoor activities, shopping.   Married x 16 years       Exercise - not currently   Caffeine - 1 cup daily   Social Determinants of Health   Financial Resource Strain: Not on file  Food Insecurity: Not on file  Transportation Needs: Not on file  Physical Activity: Not on file  Stress: Not on file  Social Connections: Not on file  Intimate Partner Violence: Not on file    Current Outpatient Medications on File Prior to Visit  Medication Sig Dispense Refill   albuterol (VENTOLIN HFA) 108 (90 Base) MCG/ACT inhaler Inhale 1-2 puffs into the lungs every 6 (six) hours as needed for wheezing or shortness of breath.  18 g 11   blood glucose meter kit and supplies Dispense based on patient and insurance preference. Use up to three times daily as directed. ( E11.9). one touch (Patient not taking: No sig reported) 1 each 0   Blood Glucose Monitoring Suppl (FREESTYLE LITE) DEVI  (Patient not taking: No sig reported)     cyclobenzaprine (FLEXERIL) 5 MG tablet TAKE 1 TABLET (5 MG TOTAL) BY MOUTH AT BEDTIME AS NEEDED FOR MUSCLE SPASMS. (Patient not taking: No sig reported) 30 tablet 2   ferrous sulfate 325 (65 FE) MG EC tablet Take 325 mg by mouth once a week. (Patient not taking: No sig reported)     FREESTYLE LITE test strip  (Patient not taking: No sig reported)     Insulin Pen Needle 32G X 4 MM MISC Use 1 each once daily (Patient not taking: No sig reported)     Lancets (FREESTYLE) lancets  (Patient not taking: No sig reported)     levonorgestrel (MIRENA) 20 MCG/24HR IUD 1 each by Intrauterine route once. (Patient not taking: No sig reported)     ondansetron (ZOFRAN) 4 MG tablet Take 4 mg by mouth once a week. With ozempic on Saturday (Patient not taking: No sig reported)     rosuvastatin (CRESTOR) 5 MG tablet Take 1 tablet (5 mg total) by mouth at bedtime. 90 tablet 3   temazepam (RESTORIL) 15 MG capsule Take 1-2 capsules (15-30 mg total) by mouth at bedtime as needed for sleep. (Patient not taking: Reported on 08/03/2020) 60 capsule 2   No current facility-administered medications on file prior to visit.    No Known Allergies   Review of Systems Constitutional: negative for chills, fatigue, fevers and sweats Eyes: negative for irritation, redness and visual disturbance Ears, nose, mouth, throat, and face: negative for hearing loss, nasal congestion, snoring and tinnitus Respiratory: negative for asthma, cough, sputum Cardiovascular: negative for chest pain, dyspnea, exertional chest pressure/discomfort, irregular heart beat, palpitations and syncope Gastrointestinal: negative for abdominal pain, change in  bowel habits, nausea and vomiting Genitourinary: negative for abnormal menstrual periods, genital lesions, sexual problems and vaginal discharge, dysuria and urinary incontinence Integument/breast: negative for breast lump, breast tenderness and nipple discharge Hematologic/lymphatic: negative for bleeding and easy bruising Musculoskeletal:negative for back pain and muscle weakness Neurological: negative for dizziness, headaches, vertigo and weakness Endocrine: negative for  diabetic symptoms including polydipsia, polyuria and skin dryness Allergic/Immunologic: negative for hay fever and urticaria      Objective:  There were no vitals taken for this visit. There is no height or weight on file to calculate BMI.    General Appearance:    Alert, cooperative, no distress, appears stated age  Head:    Normocephalic, without obvious abnormality, atraumatic  Eyes:    PERRL, conjunctiva/corneas clear, EOM's intact, both eyes  Ears:    Normal external ear canals, both ears  Nose:   Nares normal, septum midline, mucosa normal, no drainage or sinus tenderness  Throat:   Lips, mucosa, and tongue normal; teeth and gums normal  Neck:   Supple, symmetrical, trachea midline, no adenopathy; thyroid: no enlargement/tenderness/nodules; no carotid bruit or JVD  Back:     Symmetric, no curvature, ROM normal, no CVA tenderness  Lungs:     Clear to auscultation bilaterally, respirations unlabored  Chest Wall:    No tenderness or deformity   Heart:    Regular rate and rhythm, S1 and S2 normal, no murmur, rub or gallop  Breast Exam:    No tenderness, masses, or nipple abnormality  Abdomen:     Soft, non-tender, bowel sounds active all four quadrants, no masses, no organomegaly.    Genitalia:    Pelvic:external genitalia normal, vagina without lesions, discharge, or tenderness, rectovaginal septum  normal. Cervix normal in appearance, no cervical motion tenderness, no adnexal masses or tenderness.  Uterus normal  size, shape, mobile, regular contours, nontender.  Rectal:    Normal external sphincter.  No hemorrhoids appreciated. Internal exam not done.   Extremities:   Extremities normal, atraumatic, no cyanosis or edema  Pulses:   2+ and symmetric all extremities  Skin:   Skin color, texture, turgor normal, no rashes or lesions  Lymph nodes:   Cervical, supraclavicular, and axillary nodes normal  Neurologic:   CNII-XII intact, normal strength, sensation and reflexes throughout   .  Labs:  Lab Results  Component Value Date   WBC 4.1 08/03/2020   HGB 12.7 08/03/2020   HCT 38.3 08/03/2020   MCV 86.0 08/03/2020   PLT 299.0 08/03/2020    Lab Results  Component Value Date   CREATININE 0.76 08/03/2020   BUN 8 08/03/2020   NA 138 08/03/2020   K 3.6 08/03/2020   CL 102 08/03/2020   CO2 24 08/03/2020    Lab Results  Component Value Date   ALT 20 08/03/2020   AST 29 08/03/2020   ALKPHOS 48 08/03/2020   BILITOT 1.4 (H) 08/03/2020    Lab Results  Component Value Date   TSH 0.91 08/03/2020     Assessment:   No diagnosis found.   Plan:  Blood tests: UTD done by PCP. Breast self exam technique reviewed and patient encouraged to perform self-exam monthly. Contraception: IUD. Discussed healthy lifestyle modifications. Mammogram ordered Pap smear  UTD . COVID vaccination status: Follow up in 1 year for annual exam   Rubie Maid, MD [facility]

## 2021-01-27 DIAGNOSIS — Z20822 Contact with and (suspected) exposure to covid-19: Secondary | ICD-10-CM | POA: Diagnosis not present

## 2021-02-02 DIAGNOSIS — Z20822 Contact with and (suspected) exposure to covid-19: Secondary | ICD-10-CM | POA: Diagnosis not present

## 2021-02-07 ENCOUNTER — Ambulatory Visit: Payer: 59 | Admitting: Internal Medicine

## 2021-02-07 ENCOUNTER — Telehealth: Payer: Self-pay | Admitting: Internal Medicine

## 2021-02-07 NOTE — Telephone Encounter (Signed)
Patient no-showed today's appointment; appointment was for 02/07/21, provider notified for review of record. Letter sent for patient to call in and re-schedule.

## 2021-02-09 DIAGNOSIS — Z20822 Contact with and (suspected) exposure to covid-19: Secondary | ICD-10-CM | POA: Diagnosis not present

## 2021-02-26 ENCOUNTER — Other Ambulatory Visit: Payer: Self-pay | Admitting: Internal Medicine

## 2021-02-26 DIAGNOSIS — Z1231 Encounter for screening mammogram for malignant neoplasm of breast: Secondary | ICD-10-CM

## 2021-03-08 ENCOUNTER — Ambulatory Visit: Payer: 59 | Admitting: Internal Medicine

## 2021-03-29 ENCOUNTER — Telehealth: Payer: Self-pay | Admitting: Internal Medicine

## 2021-03-29 ENCOUNTER — Encounter: Payer: Self-pay | Admitting: Internal Medicine

## 2021-03-29 NOTE — Telephone Encounter (Signed)
Left message to return call. Attempting to pre-chart for Patient's appointment tomorrow, 03/30/21 at 7:40.  ?

## 2021-03-30 ENCOUNTER — Telehealth: Payer: Self-pay | Admitting: Internal Medicine

## 2021-03-30 ENCOUNTER — Telehealth: Payer: 59 | Admitting: Internal Medicine

## 2021-03-30 NOTE — Telephone Encounter (Signed)
Left message to return call at 7:20 am and 7:38 am.  ? ?Attempting to inform Patient that 7:40 am appointment can be made virtual. Unable to get in contact with the Patient.  ?

## 2021-03-30 NOTE — Telephone Encounter (Signed)
Me ?to Jacqueline Padilla   ?   7:16 AM ?Good morning,  ?  ?With your recent exposure we will need to make your appointment virtual.  ? ?This MyChart message has not been read. ?March 29, 2021 ?Jacqueline Padilla ?to [facility] (supporting McLean-Scocuzza, Nino Glow, MD)   ?LB ?   8:47 PM ?Dr Olivia Mackie I have an appointment with you in the morning and I may have been exposed to Covid. I had someone in my car and we wasn?t wearing a mask please advise  ?

## 2021-04-18 ENCOUNTER — Encounter: Payer: Self-pay | Admitting: Obstetrics and Gynecology

## 2021-04-18 NOTE — Progress Notes (Deleted)
GYNECOLOGY ANNUAL PHYSICAL EXAM PROGRESS NOTE  Subjective:    Jacqueline Padilla is a 46 y.o. G13P1011 female who presents for an annual exam. The patient has no complaints today. The patient {is/is not/has never been:13135} sexually active. The patient participates in regular exercise: {yes/no/not asked:9010}. Has the patient ever been transfused or tattooed?: {yes/no/not asked:9010}. The patient reports that there {is/is not:9024} domestic violence in her life.    Menstrual History: Menarche age: *** No LMP recorded. (Menstrual status: IUD).     Gynecologic History:  Contraception: {method:5051} History of STI's:  Last Pap: ***. Results were: {norm/abn:16337}.  ***Denies/Notes h/o abnormal pap smears. Last mammogram: ***. Results were: {norm/abn:16337}       OB History  Gravida Para Term Preterm AB Living  2 1 1  0 1 1  SAB IAB Ectopic Multiple Live Births  1 0 0 0 1    # Outcome Date GA Lbr Len/2nd Weight Sex Delivery Anes PTL Lv  2 Term 2003   7 lb 2.6 oz (3.248 kg) M Vag-Spont   LIV  1 SAB 2001            Past Medical History:  Diagnosis Date   Anemia    Anxiety    Depression    Diabetes mellitus without complication ([facility])    Herpes    1/2 +   History of chicken pox    History of urinary tract infection    Insomnia    Iron deficiency    Vitamin D deficiency     Past Surgical History:  Procedure Laterality Date   DILATION AND CURETTAGE OF UTERUS  2001   s/p miscarriage   ENDOMETRIAL ABLATION  2006   hx of abnormal pap   TONSILLECTOMY AND ADENOIDECTOMY  1994   TUBAL LIGATION  2006    Family History  Problem Relation Age of Onset   Diabetes Mother    Hypertension Mother    Hypertension Father    Heart disease Father        CABG   Cancer Maternal Grandfather        Lung cancer - non smoker   Breast cancer Paternal Grandmother    Bipolar disorder Sister    Anxiety disorder Brother    Depression Brother    Stroke Maternal Grandmother     Bipolar disorder Cousin    Stomach cancer Other        maternal uncle   Ovarian cancer Neg Hx    Colon cancer Neg Hx     Social History   Socioeconomic History   Marital status: Married    Spouse name: Not on file   Number of children: 1   Years of education: 15   Highest education level: Not on file  Occupational History   Occupation: Unit Clerk    Comment: [employer]   Occupation: Advertising copywriter    Comment: [employer]  Tobacco Use   Smoking status: Former    Packs/day: 0.25    Years: 18.00    Pack years: 4.50    Types: Cigarettes    Start date: 07/14/2017   Smokeless tobacco: Never  Vaping Use   Vaping Use: Never used  Substance and Sexual Activity   Alcohol use: Yes    Alcohol/week: 0.0 standard drinks    Comment: Occassional glass of wine   Drug use: No   Sexual activity: Yes    Partners: Male    Birth control/protection: Surgical, I.U.D.  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 She lives in [place] with her Husband and their son. Her husband has 3 daughters that live with them as well. She has 58 y.o son as of 06/2017. 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 She enjoys spending time with the family, outdoor activities, shopping.   Married x 16 years       Exercise - not currently   Caffeine - 1 cup daily   Social Determinants of Health   Financial Resource Strain: Not on file  Food Insecurity: Not on file  Transportation Needs: Not on file  Physical Activity: Not on file  Stress: Not on file  Social Connections: Not on file  Intimate Partner Violence: Not on file    Current Outpatient Medications on File Prior to Visit  Medication Sig Dispense Refill   albuterol (VENTOLIN HFA) 108 (90 Base) MCG/ACT inhaler Inhale 1-2 puffs into the lungs every 6 (six) hours as needed for wheezing or  shortness of breath. 18 g 11   blood glucose meter kit and supplies Dispense based on patient and insurance preference. Use up to three times daily as directed. ( E11.9). one touch (Patient not taking: No sig reported) 1 each 0   Blood Glucose Monitoring Suppl (FREESTYLE LITE) DEVI  (Patient not taking: No sig reported)     cyclobenzaprine (FLEXERIL) 5 MG tablet TAKE 1 TABLET (5 MG TOTAL) BY MOUTH AT BEDTIME AS NEEDED FOR MUSCLE SPASMS. (Patient not taking: No sig reported) 30 tablet 2   ferrous sulfate 325 (65 FE) MG EC tablet Take 325 mg by mouth once a week. (Patient not taking: No sig reported)     FREESTYLE LITE test strip  (Patient not taking: No sig reported)     Insulin Pen Needle 32G X 4 MM MISC Use 1 each once daily (Patient not taking: No sig reported)     Lancets (FREESTYLE) lancets  (Patient not taking: No sig reported)     levonorgestrel (MIRENA) 20 MCG/24HR IUD 1 each by Intrauterine route once. (Patient not taking: No sig reported)     ondansetron (ZOFRAN) 4 MG tablet Take 4 mg by mouth once a week. With ozempic on Saturday (Patient not taking: No sig reported)     rosuvastatin (CRESTOR) 5 MG tablet Take 1 tablet (5 mg total) by mouth at bedtime. 90 tablet 3   temazepam (RESTORIL) 15 MG capsule Take 1-2 capsules (15-30 mg total) by mouth at bedtime as needed for sleep. (Patient not taking: Reported on 08/03/2020) 60 capsule 2   No current facility-administered medications on file prior to visit.    No Known Allergies   Review of Systems Constitutional: negative for chills, fatigue, fevers and sweats Eyes: negative for irritation, redness and visual disturbance Ears, nose, mouth, throat, and face: negative for hearing loss, nasal congestion, snoring and tinnitus Respiratory: negative for asthma, cough, sputum Cardiovascular: negative for chest pain, dyspnea, exertional chest pressure/discomfort, irregular heart beat, palpitations and syncope Gastrointestinal: negative for  abdominal pain, change in bowel habits, nausea and vomiting Genitourinary: negative for abnormal menstrual periods, genital lesions, sexual problems and vaginal discharge, dysuria and urinary incontinence Integument/breast: negative for breast lump, breast tenderness and nipple discharge Hematologic/lymphatic: negative for bleeding and easy bruising Musculoskeletal:negative for back pain and muscle weakness Neurological: negative for dizziness, headaches, vertigo and weakness Endocrine: negative for  diabetic symptoms including polydipsia, polyuria and skin dryness Allergic/Immunologic: negative for hay fever and urticaria      Objective:  There were no vitals taken for this visit. There is no height or weight on file to calculate BMI.    General Appearance:    Alert, cooperative, no distress, appears stated age  Head:    Normocephalic, without obvious abnormality, atraumatic  Eyes:    PERRL, conjunctiva/corneas clear, EOM's intact, both eyes  Ears:    Normal external ear canals, both ears  Nose:   Nares normal, septum midline, mucosa normal, no drainage or sinus tenderness  Throat:   Lips, mucosa, and tongue normal; teeth and gums normal  Neck:   Supple, symmetrical, trachea midline, no adenopathy; thyroid: no enlargement/tenderness/nodules; no carotid bruit or JVD  Back:     Symmetric, no curvature, ROM normal, no CVA tenderness  Lungs:     Clear to auscultation bilaterally, respirations unlabored  Chest Wall:    No tenderness or deformity   Heart:    Regular rate and rhythm, S1 and S2 normal, no murmur, rub or gallop  Breast Exam:    No tenderness, masses, or nipple abnormality  Abdomen:     Soft, non-tender, bowel sounds active all four quadrants, no masses, no organomegaly.    Genitalia:    Pelvic:external genitalia normal, vagina without lesions, discharge, or tenderness, rectovaginal septum  normal. Cervix normal in appearance, no cervical motion tenderness, no adnexal masses or  tenderness.  Uterus normal size, shape, mobile, regular contours, nontender.  Rectal:    Normal external sphincter.  No hemorrhoids appreciated. Internal exam not done.   Extremities:   Extremities normal, atraumatic, no cyanosis or edema  Pulses:   2+ and symmetric all extremities  Skin:   Skin color, texture, turgor normal, no rashes or lesions  Lymph nodes:   Cervical, supraclavicular, and axillary nodes normal  Neurologic:   CNII-XII intact, normal strength, sensation and reflexes throughout   .  Labs:  Lab Results  Component Value Date   WBC 4.1 08/03/2020   HGB 12.7 08/03/2020   HCT 38.3 08/03/2020   MCV 86.0 08/03/2020   PLT 299.0 08/03/2020    Lab Results  Component Value Date   CREATININE 0.76 08/03/2020   BUN 8 08/03/2020   NA 138 08/03/2020   K 3.6 08/03/2020   CL 102 08/03/2020   CO2 24 08/03/2020    Lab Results  Component Value Date   ALT 20 08/03/2020   AST 29 08/03/2020   ALKPHOS 48 08/03/2020   BILITOT 1.4 (H) 08/03/2020    Lab Results  Component Value Date   TSH 0.91 08/03/2020     Assessment:   No diagnosis found.   Plan:  Blood tests: {blood tests:13147}. Breast self exam technique reviewed and patient encouraged to perform self-exam monthly. Contraception: {contraceptive methods:5051}. Discussed healthy lifestyle modifications. Mammogram {discussed/ordered:14545} Pap smear {discussed/ordered:14545}. COVID vaccination status: Follow up in 1 year for annual exam   Hildred Laser, MD [facility]

## 2021-04-18 NOTE — Patient Instructions (Incomplete)
Breast Self-Awareness ?Breast self-awareness is knowing how your breasts look and feel. Doing breast self-awareness is important. It allows you to catch a breast problem early while it is still small and can be treated. All women should do breast self-awareness, including women who have had breast implants. Tell your doctor if you notice a change in your breasts. ?What you need: ?A mirror. ?A well-lit room. ?How to do a breast self-exam ?A breast self-exam is one way to learn what is normal for your breasts and to check for changes. To do a breast self-exam: ?Look for changes ? ?Take off all the clothes above your waist. ?Stand in front of a mirror in a room with good lighting. ?Put your hands on your hips. ?Push your hands down. ?Look at your breasts and nipples in the mirror to see if one breast or nipple looks different from the other. Check to see if: ?The shape of one breast is different. ?The size of one breast is different. ?There are wrinkles, dips, and bumps in one breast and not the other. ?Look at each breast for changes in the skin, such as: ?Redness. ?Scaly areas. ?Look for changes in your nipples, such as: ?Liquid around the nipples. ?Bleeding. ?Dimpling. ?Redness. ?A change in where the nipples are. ?Feel for changes ? ?Lie on your back on the floor. ?Feel each breast. To do this, follow these steps: ?Pick a breast to feel. ?Put the arm closest to that breast above your head. ?Use your other arm to feel the nipple area of your breast. Feel the area with the pads of your three middle fingers by making small circles with your fingers. For the first circle, press lightly. For the second circle, press harder. For the third circle, press even harder. ?Keep making circles with your fingers at the different pressures as you move down your breast. Stop when you feel your ribs. ?Move your fingers a little toward the center of your body. ?Start making circles with your fingers again, this time going up until  you reach your collarbone. ?Keep making up-and-down circles until you reach your armpit. Remember to keep using the three pressures. ?Feel the other breast in the same way. ?Sit or stand in the tub or shower. ?With soapy water on your skin, feel each breast the same way you did in step 2 when you were lying on the floor. ?Write down what you find ?Writing down what you find can help you remember what to tell your doctor. Write down: ?What is normal for each breast. ?Any changes you find in each breast, including: ?The kind of changes you find. ?Whether you have pain. ?Size and location of any lumps. ?When you last had your menstrual period. ?General tips ?Check your breasts every month. ?If you are breastfeeding, the best time to check your breasts is after you feed your baby or after you use a breast pump. ?If you get menstrual periods, the best time to check your breasts is 5-7 days after your menstrual period is over. ?With time, you will become comfortable with the self-exam, and you will begin to know if there are changes in your breasts. ?Contact a doctor if you: ?See a change in the shape or size of your breasts or nipples. ?See a change in the skin of your breast or nipples, such as red or scaly skin. ?Have fluid coming from your nipples that is not normal. ?Find a lump or thick area that was not there before. ?Have pain in   your breasts. ?Have any concerns about your breast health. ?Summary ?Breast self-awareness includes looking for changes in your breasts, as well as feeling for changes within your breasts. ?Breast self-awareness should be done in front of a mirror in a well-lit room. ?You should check your breasts every month. If you get menstrual periods, the best time to check your breasts is 5-7 days after your menstrual period is over. ?Let your doctor know of any changes you see in your breasts, including changes in size, changes on the skin, pain or tenderness, or fluid from your nipples that is not  normal. ?This information is not intended to replace advice given to you by your health care provider. Make sure you discuss any questions you have with your health care provider. ?Document Revised: 08/19/2017 Document Reviewed: 08/19/2017 ?Elsevier Patient Education ? 2022 Elsevier Inc. ?Preventive Care 40-64 Years Old, Female ?Preventive care refers to lifestyle choices and visits with your health care provider that can promote health and wellness. Preventive care visits are also called wellness exams. ?What can I expect for my preventive care visit? ?Counseling ?Your health care provider may ask you questions about your: ?Medical history, including: ?Past medical problems. ?Family medical history. ?Pregnancy history. ?Current health, including: ?Menstrual cycle. ?Method of birth control. ?Emotional well-being. ?Home life and relationship well-being. ?Sexual activity and sexual health. ?Lifestyle, including: ?Alcohol, nicotine or tobacco, and drug use. ?Access to firearms. ?Diet, exercise, and sleep habits. ?Work and work environment. ?Sunscreen use. ?Safety issues such as seatbelt and bike helmet use. ?Physical exam ?Your health care provider will check your: ?Height and weight. These may be used to calculate your BMI (body mass index). BMI is a measurement that tells if you are at a healthy weight. ?Waist circumference. This measures the distance around your waistline. This measurement also tells if you are at a healthy weight and may help predict your risk of certain diseases, such as type 2 diabetes and high blood pressure. ?Heart rate and blood pressure. ?Body temperature. ?Skin for abnormal spots. ?What immunizations do I need? ?Vaccines are usually given at various ages, according to a schedule. Your health care provider will recommend vaccines for you based on your age, medical history, and lifestyle or other factors, such as travel or where you work. ?What tests do I need? ?Screening ?Your health care  provider may recommend screening tests for certain conditions. This may include: ?Lipid and cholesterol levels. ?Diabetes screening. This is done by checking your blood sugar (glucose) after you have not eaten for a while (fasting). ?Pelvic exam and Pap test. ?Hepatitis B test. ?Hepatitis C test. ?HIV (human immunodeficiency virus) test. ?STI (sexually transmitted infection) testing, if you are at risk. ?Lung cancer screening. ?Colorectal cancer screening. ?Mammogram. Talk with your health care provider about when you should start having regular mammograms. This may depend on whether you have a family history of breast cancer. ?BRCA-related cancer screening. This may be done if you have a family history of breast, ovarian, tubal, or peritoneal cancers. ?Bone density scan. This is done to screen for osteoporosis. ?Talk with your health care provider about your test results, treatment options, and if necessary, the need for more tests. ?Follow these instructions at home: ?Eating and drinking ? ?Eat a diet that includes fresh fruits and vegetables, whole grains, lean protein, and low-fat dairy products. ?Take vitamin and mineral supplements as recommended by your health care provider. ?Do not drink alcohol if: ?Your health care provider tells you not to drink. ?You are pregnant,   may be pregnant, or are planning to become pregnant. ?If you drink alcohol: ?Limit how much you have to 0-1 drink a day. ?Know how much alcohol is in your drink. In the [place] one drink equals one 12 oz bottle of beer (355 mL), one 5 oz glass of wine (148 mL), or one 1? oz glass of hard liquor (44 mL). ?Lifestyle ?Brush your teeth every morning and night with fluoride toothpaste. Floss one time each day. ?Exercise for at least 30 minutes 5 or more days each week. ?Do not use any products that contain nicotine or tobacco. These products include cigarettes, chewing tobacco, and vaping devices, such as e-cigarettes. If you need help quitting, ask  your health care provider. ?Do not use drugs. ?If you are sexually active, practice safe sex. Use a condom or other form of protection to prevent STIs. ?If you do not wish to become pregnant, use a form of

## 2021-11-19 DIAGNOSIS — Z833 Family history of diabetes mellitus: Secondary | ICD-10-CM | POA: Diagnosis not present

## 2021-11-19 DIAGNOSIS — Z880 Allergy status to penicillin: Secondary | ICD-10-CM | POA: Diagnosis not present

## 2021-11-19 DIAGNOSIS — R030 Elevated blood-pressure reading, without diagnosis of hypertension: Secondary | ICD-10-CM | POA: Diagnosis not present

## 2022-05-29 ENCOUNTER — Encounter: Payer: 59 | Admitting: Family Medicine

## 2022-06-26 NOTE — Progress Notes (Signed)
PCP: Pcp, No   Chief Complaint  Patient presents with   Gynecologic Exam    No concerns    HPI:      Jacqueline Padilla is a 47 y.o. G2P1011 whose LMP was Patient's last menstrual period was 06/16/2022 (approximate)., presents today for her annual examination.  Her menses are random with IUD, lasting 3-5 days, light flow, mild dysmen/LBP. Would like Rx RF naproxen prn dysmen. Mirena IUD placed 04/23/18 for cycle control.   Sex activity: single partner, contraception - tubal ligation. She does not have vaginal dryness/pain/bleeding.   Last Pap: 07/26/19 Results were: no abnormalities /neg HPV DNA.  Hx of STDs: trichomonas on 7/21 pap  Last mammogram: 05/11/19 Results were: normal--routine follow-up in 12 months There is a FH of breast cancer in her PGM, genetic testing not done, age unknown. There is no FH of ovarian cancer. The patient does do self-breast exams.  Colonoscopy: never, would like to see Dr. Servando Snare  Tobacco use: The patient denies current or previous tobacco use. Alcohol use: none No drug use Exercise: min active  She does get adequate calcium but not Vitamin D in her diet.  Labs with PCP.   Patient Active Problem List   Diagnosis Date Noted   Prediabetes 08/06/2020   IUD (intrauterine device) in place 07/26/2019   Depression, recurrent (HCC) 06/23/2019   Abnormal uterine bleeding 04/17/2018   Insomnia 02/04/2018   Snoring 02/04/2018   GAD (generalized anxiety disorder) 07/10/2017   Iron deficiency anemia 07/10/2017   Vitamin D deficiency 06/23/2017   Tobacco abuse 06/18/2017   Anxiety and depression 06/18/2017   Anemia 06/18/2017   STD exposure 05/30/2017   Muscle spasm 05/30/2017   Hyperlipidemia 06/01/2015   Annual physical exam 03/08/2014   Hx of abnormal cervical Pap smear 05/05/2013    Past Surgical History:  Procedure Laterality Date   DILATION AND CURETTAGE OF UTERUS  2001   s/p miscarriage   ENDOMETRIAL ABLATION  2006   hx of  abnormal pap   TONSILLECTOMY AND ADENOIDECTOMY  1994   TUBAL LIGATION  2006    Family History  Problem Relation Age of Onset   Diabetes Mother    Hypertension Mother    Hypertension Father    Heart disease Father        CABG   Bipolar disorder Sister    Anxiety disorder Brother    Depression Brother    Stroke Maternal Grandmother    Cancer Maternal Grandfather        Lung cancer - non smoker   Breast cancer Paternal Grandmother        age unknown   Bipolar disorder Cousin    Stomach cancer Other        maternal uncle   Ovarian cancer Neg Hx    Colon cancer Neg Hx     Social History   Socioeconomic History   Marital status: Married    Spouse name: Not on file   Number of children: 1   Years of education: 15   Highest education level: Not on file  Occupational History   Occupation: Unit Clerk    Comment: [employer]   Occupation: Advertising copywriter    Comment: [employer]  Tobacco Use   Smoking status: Former    Packs/day: 0.25    Years: 18.00    Additional pack years: 0.00    Total pack years: 4.50    Types: Cigarettes    Start date: 07/14/2017  Smokeless tobacco: Never  Vaping Use   Vaping Use: Never used  Substance and Sexual Activity   Alcohol use: Yes    Alcohol/week: 0.0 standard drinks of alcohol    Comment: Occassional glass of wine   Drug use: No   Sexual activity: Yes    Partners: Male    Birth control/protection: Surgical, I.U.D.    Comment: Mirena/Tubal ligation  Other Topics Concern   Not on file  Social History Narrative   Jacqueline Padilla grew up in [place], [place]. She lives in [place] with her Husband and their son. Her husband has 3 daughters that live with them as well. She has 59 y.o son as of 06/2017. 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 She enjoys spending time with the family, outdoor activities, shopping.   Married x 16 years        Exercise - not currently   Caffeine - 1 cup daily   Social Determinants of Health   Financial Resource Strain: Not on file  Food Insecurity: Not on file  Transportation Needs: Not on file  Physical Activity: Not on file  Stress: Not on file  Social Connections: Not on file  Intimate Partner Violence: Not on file     Current Outpatient Medications:    albuterol (VENTOLIN HFA) 108 (90 Base) MCG/ACT inhaler, Inhale 1-2 puffs into the lungs every 6 (six) hours as needed for wheezing or shortness of breath., Disp: 18 g, Rfl: 11   chlorhexidine (PERIDEX) 0.12 % solution, SMARTSIG:Capful(s) By Mouth Daily, Disp: , Rfl:    naproxen (NAPROSYN) 500 MG tablet, Take 1 tablet (500 mg total) by mouth 2 (two) times daily with a meal. As needed for pain, Disp: 30 tablet, Rfl: 1   SODIUM FLUORIDE 5000 SENSITIVE 1.1-5 % GEL, SMARTSIG:Sparingly By Mouth, Disp: , Rfl:    blood glucose meter kit and supplies, Dispense based on patient and insurance preference. Use up to three times daily as directed. ( E11.9). one touch (Patient not taking: Reported on 03/08/2020), Disp: 1 each, Rfl: 0   Blood Glucose Monitoring Suppl (FREESTYLE LITE) DEVI, , Disp: , Rfl:    cyclobenzaprine (FLEXERIL) 5 MG tablet, TAKE 1 TABLET (5 MG TOTAL) BY MOUTH AT BEDTIME AS NEEDED FOR MUSCLE SPASMS. (Patient not taking: Reported on 06/27/2022), Disp: 30 tablet, Rfl: 2   ferrous sulfate 325 (65 FE) MG EC tablet, Take 325 mg by mouth once a week. (Patient not taking: Reported on 03/08/2020), Disp: , Rfl:    FREESTYLE LITE test strip, , Disp: , Rfl:    Insulin Pen Needle 32G X 4 MM MISC, Use 1 each once daily (Patient not taking: No sig reported), Disp: , Rfl:    Lancets (FREESTYLE) lancets, , Disp: , Rfl:    levonorgestrel (MIRENA) 20 MCG/24HR IUD, 1 each by Intrauterine route once. (Patient not taking: Reported on 03/08/2020), Disp: , Rfl:    ondansetron (ZOFRAN) 4 MG tablet, Take 4 mg by mouth once a week. With ozempic on Saturday (Patient  not taking: Reported on 03/08/2020), Disp: , Rfl:    rosuvastatin (CRESTOR) 5 MG tablet, Take 1 tablet (5 mg total) by mouth at bedtime. (Patient not taking: Reported on 06/27/2022), Disp: 90 tablet, Rfl: 3   temazepam (RESTORIL) 15 MG capsule, Take 1-2 capsules (15-30 mg total) by mouth at bedtime as needed for sleep. (Patient not taking: Reported on 08/03/2020), Disp: 60 capsule, Rfl:  2     ROS:  Review of Systems  Constitutional:  Positive for fatigue. Negative for fever and unexpected weight change.  Respiratory:  Negative for cough, shortness of breath and wheezing.   Cardiovascular:  Negative for chest pain, palpitations and leg swelling.  Gastrointestinal:  Negative for blood in stool, constipation, diarrhea, nausea and vomiting.  Endocrine: Negative for cold intolerance, heat intolerance and polyuria.  Genitourinary:  Negative for dyspareunia, dysuria, flank pain, frequency, genital sores, hematuria, menstrual problem, pelvic pain, urgency, vaginal bleeding, vaginal discharge and vaginal pain.  Musculoskeletal:  Negative for back pain, joint swelling and myalgias.  Skin:  Negative for rash.  Neurological:  Negative for dizziness, syncope, light-headedness, numbness and headaches.  Hematological:  Negative for adenopathy.  Psychiatric/Behavioral:  Negative for agitation, confusion, sleep disturbance and suicidal ideas. The patient is not nervous/anxious.    BREAST: No symptoms    Objective: BP 120/80   Ht 5\' 1"  (1.549 m)   Wt 169 lb (76.7 kg)   LMP 06/16/2022 (Approximate)   BMI 31.93 kg/m    Physical Exam Constitutional:      Appearance: She is well-developed.  Genitourinary:     Vulva normal.     Right Labia: No rash, tenderness or lesions.    Left Labia: No tenderness, lesions or rash.    No vaginal discharge, erythema or tenderness.      Right Adnexa: not tender and no mass present.    Left Adnexa: not tender and no mass present.    No cervical friability or  polyp.     IUD strings visualized.     Uterus is not enlarged or tender.  Breasts:    Right: No mass, nipple discharge, skin change or tenderness.     Left: No mass, nipple discharge, skin change or tenderness.  Neck:     Thyroid: No thyromegaly.  Cardiovascular:     Rate and Rhythm: Normal rate and regular rhythm.     Heart sounds: Normal heart sounds. No murmur heard. Pulmonary:     Effort: Pulmonary effort is normal.     Breath sounds: Normal breath sounds.  Abdominal:     Palpations: Abdomen is soft.     Tenderness: There is no abdominal tenderness. There is no guarding or rebound.  Musculoskeletal:        General: Normal range of motion.     Cervical back: Normal range of motion.  Lymphadenopathy:     Cervical: No cervical adenopathy.  Neurological:     General: No focal deficit present.     Mental Status: She is alert and oriented to person, place, and time.     Cranial Nerves: No cranial nerve deficit.  Skin:    General: Skin is warm and dry.  Psychiatric:        Mood and Affect: Mood normal.        Behavior: Behavior normal.        Thought Content: Thought content normal.        Judgment: Judgment normal.  Vitals reviewed.    Assessment/Plan:  Encounter for annual routine gynecological examination  Encounter for screening mammogram for malignant neoplasm of breast - Plan: MM 3D SCREENING MAMMOGRAM BILATERAL BREAST; pt to schedule mammo  Encounter for routine checking of intrauterine contraceptive device (IUD); has 8 yr indication  Dysmenorrhea - Plan: naproxen (NAPROSYN) 500 MG tablet; Rx eRxd. F/u prn.   Screening for colon cancer - Plan: Ambulatory referral to Gastroenterology; refer to Dr. Servando Snare per pt  pref.   Meds ordered this encounter  Medications   naproxen (NAPROSYN) 500 MG tablet    Sig: Take 1 tablet (500 mg total) by mouth 2 (two) times daily with a meal. As needed for pain    Dispense:  30 tablet    Refill:  1    Order Specific Question:    Supervising Provider    Answer:   Waymon Budge         GYN counsel breast self exam, mammography screening, adequate intake of calcium and vitamin D, diet and exercise    F/U  Return in about 1 year (around 06/27/2023).  Jacqueline Mcclurg B. Maigan Bittinger, PA-C 06/27/2022 5:26 PM

## 2022-06-27 ENCOUNTER — Ambulatory Visit (INDEPENDENT_AMBULATORY_CARE_PROVIDER_SITE_OTHER): Payer: Medicaid Other | Admitting: Obstetrics and Gynecology

## 2022-06-27 ENCOUNTER — Encounter: Payer: Self-pay | Admitting: Obstetrics and Gynecology

## 2022-06-27 VITALS — BP 120/80 | Ht 61.0 in | Wt 169.0 lb

## 2022-06-27 DIAGNOSIS — Z01419 Encounter for gynecological examination (general) (routine) without abnormal findings: Secondary | ICD-10-CM | POA: Diagnosis not present

## 2022-06-27 DIAGNOSIS — Z30431 Encounter for routine checking of intrauterine contraceptive device: Secondary | ICD-10-CM

## 2022-06-27 DIAGNOSIS — N946 Dysmenorrhea, unspecified: Secondary | ICD-10-CM

## 2022-06-27 DIAGNOSIS — Z1231 Encounter for screening mammogram for malignant neoplasm of breast: Secondary | ICD-10-CM

## 2022-06-27 DIAGNOSIS — Z1211 Encounter for screening for malignant neoplasm of colon: Secondary | ICD-10-CM

## 2022-06-27 MED ORDER — NAPROXEN 500 MG PO TABS: 500.0000 mg | ORAL_TABLET | Freq: Two times a day (BID) | ORAL | 1 refills | Status: DC

## 2022-06-27 NOTE — Patient Instructions (Signed)
I value your feedback and you entrusting us with your care. If you get a  patient survey, I would appreciate you taking the time to let us know about your experience today. Thank you!  [facility] ([place])--[phone]  

## 2022-07-08 ENCOUNTER — Encounter: Payer: Self-pay | Admitting: Family Medicine

## 2022-07-08 ENCOUNTER — Ambulatory Visit: Payer: Medicaid Other | Admitting: Family Medicine

## 2022-07-08 ENCOUNTER — Telehealth: Payer: Self-pay

## 2022-07-08 ENCOUNTER — Other Ambulatory Visit: Payer: Self-pay

## 2022-07-08 VITALS — BP 130/78 | HR 89 | Temp 98.5°F | Ht 61.0 in | Wt 168.0 lb

## 2022-07-08 DIAGNOSIS — E669 Obesity, unspecified: Secondary | ICD-10-CM

## 2022-07-08 DIAGNOSIS — E1169 Type 2 diabetes mellitus with other specified complication: Secondary | ICD-10-CM

## 2022-07-08 DIAGNOSIS — E559 Vitamin D deficiency, unspecified: Secondary | ICD-10-CM | POA: Diagnosis not present

## 2022-07-08 DIAGNOSIS — Z1211 Encounter for screening for malignant neoplasm of colon: Secondary | ICD-10-CM

## 2022-07-08 DIAGNOSIS — E785 Hyperlipidemia, unspecified: Secondary | ICD-10-CM | POA: Diagnosis not present

## 2022-07-08 DIAGNOSIS — E118 Type 2 diabetes mellitus with unspecified complications: Secondary | ICD-10-CM

## 2022-07-08 DIAGNOSIS — I152 Hypertension secondary to endocrine disorders: Secondary | ICD-10-CM

## 2022-07-08 DIAGNOSIS — E119 Type 2 diabetes mellitus without complications: Secondary | ICD-10-CM

## 2022-07-08 DIAGNOSIS — F39 Unspecified mood [affective] disorder: Secondary | ICD-10-CM

## 2022-07-08 MED ORDER — NA SULFATE-K SULFATE-MG SULF 17.5-3.13-1.6 GM/177ML PO SOLN: 1.0000 | Freq: Once | ORAL | 0 refills | Status: DC

## 2022-07-08 NOTE — Patient Instructions (Addendum)
It was a pleasure meeting you today. Thank you for allowing me to take part in your health care.  Our goals for today as we discussed include:  We will get some labs today.  If they are abnormal or we need to do something about them, I will call you.  If they are normal, I will send you a message on MyChart (if it is active) or a letter in the mail.  If you don't hear from [place] in 2 weeks, please call the office at the number below.    Referral sent for Eye exam.  Foot exam next visit  Follow up in 2 weeks  If you have any questions or concerns, please do not hesitate to call the office at [phone].  I look forward to our next visit and until then take care and stay safe.  I value your feedback and you entrusting [place] with your care. If you get a  patient survey, I would appreciate you taking the time to let us know about your experience today. Thank you!       Regards,   Dana Allan, MD   [facility]

## 2022-07-08 NOTE — Progress Notes (Signed)
SUBJECTIVE:   Chief Complaint  Patient presents with   Transitions Of Care   HPI Patient presents to transfer care  No acute concerns.  Would like forms for work filled out.  DM Type 2 Asymptomatic. Not currently on medication. Was previously prescribed Metformin and Ozempic  but self discontinued.  Denies any polyuria, polydipsia or polyphagia.    Hyperlipidemia Not currently on statin therapy   PERTINENT PMH / PSH: DM Type 2 HLD   OBJECTIVE:  BP 130/78   Pulse 89   Temp 98.5 F (36.9 C)   Ht 5\' 1"  (1.549 m)   Wt 168 lb (76.2 kg)   LMP 06/16/2022 (Approximate)   SpO2 98%   BMI 31.74 kg/m    Physical Exam Constitutional:      General: She is not in acute distress.    Appearance: She is normal weight. She is not ill-appearing.  HENT:     Head: Normocephalic.     Right Ear: Tympanic membrane, ear canal and external ear normal.     Left Ear: Tympanic membrane, ear canal and external ear normal.  Eyes:     Conjunctiva/sclera: Conjunctivae normal.  Neck:     Thyroid: No thyromegaly or thyroid tenderness.  Cardiovascular:     Rate and Rhythm: Normal rate and regular rhythm.     Pulses: Normal pulses.  Pulmonary:     Effort: Pulmonary effort is normal.     Breath sounds: Normal breath sounds.  Abdominal:     General: Bowel sounds are normal.  Neurological:     Mental Status: She is alert. Mental status is at baseline.  Psychiatric:        Mood and Affect: Mood normal.        Behavior: Behavior normal.        Thought Content: Thought content normal.        Judgment: Judgment normal.        07/08/2022    3:40 PM 08/03/2020   11:51 AM 03/08/2020    3:17 PM 04/21/2019    3:42 PM 09/17/2018    1:01 PM  Depression screen PHQ 2/9  Decreased Interest 1 0 3 0 0  Down, Depressed, Hopeless 1 0 3 0 0  PHQ - 2 Score 2 0 6 0 0  Altered sleeping 1 0 3    Tired, decreased energy 1 0 3    Change in appetite 1 0 3    Feeling bad or failure about yourself  0 0 3     Trouble concentrating 1 0 3    Moving slowly or fidgety/restless 0 0 3    Suicidal thoughts 0 0 3    PHQ-9 Score 6 0 27    Difficult doing work/chores Somewhat difficult Not difficult at all Very difficult        07/08/2022    3:40 PM 03/08/2020    3:17 PM 04/21/2019    3:42 PM 01/13/2018    3:12 PM  GAD 7 : Generalized Anxiety Score  Nervous, Anxious, on Edge 1 3 0 3  Control/stop worrying 1 3 0 1  Worry too much - different things 1 3 0 3  Trouble relaxing 1 3 0 3  Restless 1 3 0 3  Easily annoyed or irritable 1 3 0 1  Afraid - awful might happen 1 3 0 1  Total GAD 7 Score 7 21 0 15  Anxiety Difficulty Somewhat difficult Extremely difficult Not difficult at all Somewhat difficult  ASSESSMENT/PLAN:  Type 2 diabetes with complication (HCC) Assessment & Plan: Chronic Check A1c Not on Statis/ARB therapy BP controlled Referral for eye exam Foot exam at next visit   Orders: -     Hemoglobin A1c -     Comprehensive metabolic panel -     CBC with Differential/Platelet -     Microalbumin / creatinine urine ratio -     Vitamin B12  Vitamin D deficiency Assessment & Plan: Chronic Check vitamin D level  Orders: -     VITAMIN D 25 Hydroxy (Vit-D Deficiency, Fractures)  Hyperlipidemia associated with type 2 diabetes mellitus (HCC) Assessment & Plan: Chronic. Check lipids The 10-year ASCVD risk score (Arnett DK, et al., 2019) is: 4.1%   Orders: -     Lipid panel  Diabetic eye exam (HCC) -     Ambulatory referral to Ophthalmology  Obesity (BMI 30-39.9) Assessment & Plan: Chronic Check TSH  Orders: -     TSH   PDMP reviewed  Return in about 2 weeks (around 07/22/2022) for PCP.  Dana Allan, MD

## 2022-07-08 NOTE — Telephone Encounter (Signed)
Gastroenterology Pre-Procedure Review  Request Date: 08/26/22 Requesting Physician: Dr. Servando Snare  PATIENT REVIEW QUESTIONS: The patient responded to the following health history questions as indicated:    1. Are you having any GI issues? no 2. Do you have a personal history of Polyps? no 3. Do you have a family history of Colon Cancer or Polyps? no 4. Diabetes Mellitus? no 5. Joint replacements in the past 12 months?no 6. Major health problems in the past 3 months?no 7. Any artificial heart valves, MVP, or defibrillator?no    MEDICATIONS & ALLERGIES:    Patient reports the following regarding taking any anticoagulation/antiplatelet therapy:   Plavix, Coumadin, Eliquis, Xarelto, Lovenox, Pradaxa, Brilinta, or Effient? no Aspirin? no  Patient confirms/reports the following medications:  Current Outpatient Medications  Medication Sig Dispense Refill   albuterol (VENTOLIN HFA) 108 (90 Base) MCG/ACT inhaler Inhale 1-2 puffs into the lungs every 6 (six) hours as needed for wheezing or shortness of breath. 18 g 11   blood glucose meter kit and supplies Dispense based on patient and insurance preference. Use up to three times daily as directed. ( E11.9). one touch (Patient not taking: Reported on 03/08/2020) 1 each 0   Blood Glucose Monitoring Suppl (FREESTYLE LITE) DEVI  (Patient not taking: Reported on 03/08/2020)     chlorhexidine (PERIDEX) 0.12 % solution SMARTSIG:Capful(s) By Mouth Daily     cyclobenzaprine (FLEXERIL) 5 MG tablet TAKE 1 TABLET (5 MG TOTAL) BY MOUTH AT BEDTIME AS NEEDED FOR MUSCLE SPASMS. (Patient not taking: Reported on 06/27/2022) 30 tablet 2   ferrous sulfate 325 (65 FE) MG EC tablet Take 325 mg by mouth once a week. (Patient not taking: Reported on 03/08/2020)     FREESTYLE LITE test strip  (Patient not taking: Reported on 03/08/2020)     Insulin Pen Needle 32G X 4 MM MISC Use 1 each once daily (Patient not taking: No sig reported)     Lancets (FREESTYLE) lancets  (Patient not  taking: Reported on 03/08/2020)     levonorgestrel (MIRENA) 20 MCG/24HR IUD 1 each by Intrauterine route once. (Patient not taking: Reported on 03/08/2020)     naproxen (NAPROSYN) 500 MG tablet Take 1 tablet (500 mg total) by mouth 2 (two) times daily with a meal. As needed for pain 30 tablet 1   ondansetron (ZOFRAN) 4 MG tablet Take 4 mg by mouth once a week. With ozempic on Saturday (Patient not taking: Reported on 03/08/2020)     rosuvastatin (CRESTOR) 5 MG tablet Take 1 tablet (5 mg total) by mouth at bedtime. (Patient not taking: Reported on 06/27/2022) 90 tablet 3   SODIUM FLUORIDE 5000 SENSITIVE 1.1-5 % GEL SMARTSIG:Sparingly By Mouth     temazepam (RESTORIL) 15 MG capsule Take 1-2 capsules (15-30 mg total) by mouth at bedtime as needed for sleep. (Patient not taking: Reported on 08/03/2020) 60 capsule 2   No current facility-administered medications for this visit.    Patient confirms/reports the following allergies:  No Known Allergies  No orders of the defined types were placed in this encounter.   AUTHORIZATION INFORMATION Primary Insurance: 1D#: Group #:  Secondary Insurance: 1D#: Group #:  SCHEDULE INFORMATION: Date: 08/26/22 Time: Location: [facility]

## 2022-07-09 DIAGNOSIS — H524 Presbyopia: Secondary | ICD-10-CM | POA: Diagnosis not present

## 2022-07-09 DIAGNOSIS — E119 Type 2 diabetes mellitus without complications: Secondary | ICD-10-CM | POA: Diagnosis not present

## 2022-07-09 DIAGNOSIS — H02403 Unspecified ptosis of bilateral eyelids: Secondary | ICD-10-CM | POA: Diagnosis not present

## 2022-07-09 DIAGNOSIS — H5203 Hypermetropia, bilateral: Secondary | ICD-10-CM | POA: Diagnosis not present

## 2022-07-09 LAB — CBC WITH DIFFERENTIAL/PLATELET
Basophils Absolute: 0 10*3/uL (ref 0.0–0.1)
Basophils Relative: 1.1 % (ref 0.0–3.0)
Eosinophils Absolute: 0.1 10*3/uL (ref 0.0–0.7)
Eosinophils Relative: 1.2 % (ref 0.0–5.0)
HCT: 41 % (ref 36.0–46.0)
Hemoglobin: 13.3 g/dL (ref 12.0–15.0)
Lymphocytes Relative: 31 % (ref 12.0–46.0)
Lymphs Abs: 1.5 10*3/uL (ref 0.7–4.0)
MCHC: 32.4 g/dL (ref 30.0–36.0)
MCV: 86.6 fl (ref 78.0–100.0)
Monocytes Absolute: 0.3 10*3/uL (ref 0.1–1.0)
Monocytes Relative: 5.8 % (ref 3.0–12.0)
Neutro Abs: 2.9 10*3/uL (ref 1.4–7.7)
Neutrophils Relative %: 60.9 % (ref 43.0–77.0)
Platelets: 369 10*3/uL (ref 150.0–400.0)
RBC: 4.74 Mil/uL (ref 3.87–5.11)
RDW: 13.7 % (ref 11.5–15.5)
WBC: 4.7 10*3/uL (ref 4.0–10.5)

## 2022-07-09 LAB — COMPREHENSIVE METABOLIC PANEL
ALT: 20 U/L (ref 0–35)
AST: 22 U/L (ref 0–37)
Albumin: 4.7 g/dL (ref 3.5–5.2)
Alkaline Phosphatase: 47 U/L (ref 39–117)
BUN: 9 mg/dL (ref 6–23)
CO2: 25 mEq/L (ref 19–32)
Calcium: 9.9 mg/dL (ref 8.4–10.5)
Chloride: 103 mEq/L (ref 96–112)
Creatinine, Ser: 0.84 mg/dL (ref 0.40–1.20)
GFR: 83.71 mL/min (ref >60.00)
Glucose, Bld: 92 mg/dL (ref 70–99)
Potassium: 3.9 mEq/L (ref 3.5–5.1)
Sodium: 139 mEq/L (ref 135–145)
Total Bilirubin: 0.9 mg/dL (ref 0.2–1.2)
Total Protein: 8.1 g/dL (ref 6.0–8.3)

## 2022-07-09 LAB — LIPID PANEL
Cholesterol: 228 mg/dL — ABNORMAL HIGH (ref 0–200)
HDL: 47.4 mg/dL (ref >39.00)
LDL Cholesterol: 166 mg/dL — ABNORMAL HIGH (ref 0–99)
NonHDL: 180.53
Total CHOL/HDL Ratio: 5
Triglycerides: 75 mg/dL (ref 0.0–149.0)
VLDL: 15 mg/dL (ref 0.0–40.0)

## 2022-07-09 LAB — HEMOGLOBIN A1C: Hgb A1c MFr Bld: 6.9 % — ABNORMAL HIGH (ref 4.6–6.5)

## 2022-07-09 LAB — MICROALBUMIN / CREATININE URINE RATIO
Creatinine,U: 152.1 mg/dL
Microalb Creat Ratio: 1.1 mg/g (ref 0.0–30.0)
Microalb, Ur: 1.7 mg/dL (ref 0.0–1.9)

## 2022-07-09 LAB — VITAMIN D 25 HYDROXY (VIT D DEFICIENCY, FRACTURES): VITD: 26.52 ng/mL — ABNORMAL LOW (ref 30.00–100.00)

## 2022-07-09 LAB — TSH: TSH: 1.01 u[IU]/mL (ref 0.35–5.50)

## 2022-07-09 LAB — VITAMIN B12: Vitamin B-12: 320 pg/mL (ref 211–911)

## 2022-07-09 LAB — HM DIABETES EYE EXAM

## 2022-07-10 ENCOUNTER — Ambulatory Visit
Admission: RE | Admit: 2022-07-10 | Discharge: 2022-07-10 | Disposition: A | Discharge status: Home / Self Care | Payer: Medicaid Other | Source: Ambulatory Visit | Attending: Obstetrics and Gynecology | Admitting: Obstetrics and Gynecology

## 2022-07-10 DIAGNOSIS — Z1231 Encounter for screening mammogram for malignant neoplasm of breast: Secondary | ICD-10-CM | POA: Diagnosis not present

## 2022-07-12 ENCOUNTER — Other Ambulatory Visit: Payer: Self-pay | Admitting: Family Medicine

## 2022-07-13 ENCOUNTER — Encounter: Payer: Self-pay | Admitting: Family Medicine

## 2022-07-15 ENCOUNTER — Other Ambulatory Visit: Payer: Self-pay | Admitting: Family Medicine

## 2022-07-15 DIAGNOSIS — E559 Vitamin D deficiency, unspecified: Secondary | ICD-10-CM

## 2022-07-15 DIAGNOSIS — E118 Type 2 diabetes mellitus with unspecified complications: Secondary | ICD-10-CM

## 2022-07-15 DIAGNOSIS — E1169 Type 2 diabetes mellitus with other specified complication: Secondary | ICD-10-CM

## 2022-07-15 MED ORDER — ROSUVASTATIN CALCIUM 5 MG PO TABS: 5.0000 mg | ORAL_TABLET | Freq: Every day | ORAL | 3 refills | Status: DC

## 2022-07-15 MED ORDER — VITAMIN D (ERGOCALCIFEROL) 1.25 MG (50000 UNIT) PO CAPS
50000.0000 [IU] | ORAL_CAPSULE | ORAL | 1 refills | Status: DC
Start: 2022-07-15 — End: 2023-06-26

## 2022-07-15 MED ORDER — METFORMIN HCL ER 500 MG PO TB24
500.0000 mg | ORAL_TABLET | Freq: Every day | ORAL | 0 refills | Status: DC
Start: 2022-07-15 — End: 2022-09-26

## 2022-07-16 ENCOUNTER — Telehealth: Payer: Self-pay

## 2022-07-16 ENCOUNTER — Telehealth: Payer: Self-pay | Admitting: Family Medicine

## 2022-07-16 NOTE — Telephone Encounter (Signed)
Patient contacted office to let me know that she has started new medications.  Reviewed her chart.  Dr. Clent Ridges added Metformin, Rosuvastatin, and Vitamin D. Returned phone call to give advise and let her know message was sent via mychart.   Mychart message was sent to patient to let her know to stop Metformin 2 days prior to her colonoscopy.  Stop on 08/24/22. Colonoscopy instructions updated in mychart  and printed for mail.  Thanks,  [place], [place]

## 2022-07-16 NOTE — Telephone Encounter (Signed)
Pt called stating she went to the eye doctor last week and had an eye exam. Pt stated that the eye doctor sent the information over already.

## 2022-07-17 NOTE — Telephone Encounter (Signed)
Noted will wait for the information to update chart.

## 2022-07-22 ENCOUNTER — Ambulatory Visit: Payer: Medicaid Other | Admitting: Family Medicine

## 2022-08-01 ENCOUNTER — Ambulatory Visit: Payer: Medicaid Other | Admitting: Family Medicine

## 2022-08-07 ENCOUNTER — Encounter: Payer: Self-pay | Admitting: Family Medicine

## 2022-08-07 DIAGNOSIS — E669 Obesity, unspecified: Secondary | ICD-10-CM | POA: Insufficient documentation

## 2022-08-07 NOTE — Assessment & Plan Note (Signed)
Chronic Check vitamin D level 

## 2022-08-07 NOTE — Assessment & Plan Note (Signed)
Chronic. Check lipids The 10-year ASCVD risk score (Arnett DK, et al., 2019) is: 4.1%

## 2022-08-07 NOTE — Assessment & Plan Note (Signed)
Chronic Check TSH

## 2022-08-07 NOTE — Assessment & Plan Note (Signed)
Chronic Check A1c Not on Statis/ARB therapy BP controlled Referral for eye exam Foot exam at next visit

## 2022-08-13 NOTE — Anesthesia Preprocedure Evaluation (Addendum)
Anesthesia Evaluation  Patient identified by MRN, date of birth, ID band Patient awake    Reviewed: Allergy & Precautions, H&P , NPO status , Patient's Chart, lab work & pertinent test results  Airway Mallampati: II  TM Distance: >3 FB Neck ROM: Full    Dental no notable dental hx.    Pulmonary asthma , former smoker   Pulmonary exam normal breath sounds clear to auscultation       Cardiovascular negative cardio ROS Normal cardiovascular exam Rhythm:Regular Rate:Normal     Neuro/Psych  PSYCHIATRIC DISORDERS Anxiety Depression Bipolar Disorder   negative neurological ROS  negative psych ROS   GI/Hepatic negative GI ROS, Neg liver ROS,,,  Endo/Other  diabetes    Renal/GU negative Renal ROS  negative genitourinary   Musculoskeletal negative musculoskeletal ROS (+)    Abdominal   Peds negative pediatric ROS (+)  Hematology negative hematology ROS (+) Blood dyscrasia, anemia   Anesthesia Other Findings Anxiety  Anemia Depression Insomnia  Iron deficiency Vitamin D deficiency  Diabetes mellitus without complication (HCC) Bipolar asthma    Reproductive/Obstetrics negative OB ROS                             Anesthesia Physical Anesthesia Plan  ASA: 2  Anesthesia Plan: General   Post-op Pain Management:    Induction: Intravenous  PONV Risk Score and Plan:   Airway Management Planned: Natural Airway and Nasal Cannula  Additional Equipment:   Intra-op Plan:   Post-operative Plan:   Informed Consent: I have reviewed the patients History and Physical, chart, labs and discussed the procedure including the risks, benefits and alternatives for the proposed anesthesia with the patient or authorized representative who has indicated his/her understanding and acceptance.     Dental Advisory Given  Plan Discussed with: Anesthesiologist, CRNA and Surgeon  Anesthesia Plan Comments:  (Patient consented for risks of anesthesia including but not limited to:  - adverse reactions to medications - risk of airway placement if required - damage to eyes, teeth, lips or other oral mucosa - nerve damage due to positioning  - sore throat or hoarseness - Damage to heart, brain, nerves, lungs, other parts of body or loss of life  Patient voiced understanding.)       Anesthesia Quick Evaluation

## 2022-08-14 ENCOUNTER — Encounter: Payer: Self-pay | Admitting: Gastroenterology

## 2022-08-15 ENCOUNTER — Encounter: Payer: Self-pay | Admitting: Gastroenterology

## 2022-08-24 ENCOUNTER — Encounter: Payer: Self-pay | Admitting: Family Medicine

## 2022-08-26 ENCOUNTER — Ambulatory Visit
Admission: RE | Admit: 2022-08-26 | Discharge: 2022-08-26 | Disposition: A | Discharge status: Home / Self Care | Payer: Medicaid Other | Attending: Gastroenterology | Admitting: Gastroenterology

## 2022-08-26 ENCOUNTER — Other Ambulatory Visit: Payer: Self-pay

## 2022-08-26 ENCOUNTER — Encounter
Admission: RE | Disposition: A | Discharge status: Home / Self Care | Payer: Self-pay | Source: Home / Self Care | Attending: Gastroenterology

## 2022-08-26 ENCOUNTER — Encounter: Payer: Self-pay | Admitting: Gastroenterology

## 2022-08-26 ENCOUNTER — Ambulatory Visit: Payer: Medicaid Other | Admitting: Anesthesiology

## 2022-08-26 DIAGNOSIS — Z1211 Encounter for screening for malignant neoplasm of colon: Secondary | ICD-10-CM | POA: Diagnosis not present

## 2022-08-26 DIAGNOSIS — K5730 Diverticulosis of large intestine without perforation or abscess without bleeding: Secondary | ICD-10-CM | POA: Diagnosis not present

## 2022-08-26 DIAGNOSIS — K635 Polyp of colon: Secondary | ICD-10-CM

## 2022-08-26 DIAGNOSIS — Z87891 Personal history of nicotine dependence: Secondary | ICD-10-CM | POA: Diagnosis not present

## 2022-08-26 HISTORY — PX: POLYPECTOMY: SHX5525

## 2022-08-26 HISTORY — DX: Generalized anxiety disorder: F41.1

## 2022-08-26 HISTORY — DX: Unspecified asthma, uncomplicated: J45.909

## 2022-08-26 HISTORY — DX: Bipolar disorder, current episode mixed, unspecified: F31.60

## 2022-08-26 HISTORY — PX: COLONOSCOPY WITH PROPOFOL: SHX5780

## 2022-08-26 LAB — GLUCOSE, CAPILLARY: Glucose-Capillary: 175 mg/dL — ABNORMAL HIGH (ref 70–99)

## 2022-08-26 LAB — POCT PREGNANCY, URINE: Preg Test, Ur: NEGATIVE

## 2022-08-26 SURGERY — COLONOSCOPY WITH PROPOFOL
Anesthesia: General | Site: Rectum

## 2022-08-26 MED ORDER — SODIUM CHLORIDE 0.9 % IV SOLN: INTRAVENOUS | Status: DC

## 2022-08-26 MED ORDER — LACTATED RINGERS IV SOLN: INTRAVENOUS | Status: DC

## 2022-08-26 MED ORDER — STERILE WATER FOR IRRIGATION IR SOLN
Status: DC | PRN
  Administered 2022-08-26: 1

## 2022-08-26 MED ORDER — LIDOCAINE HCL (CARDIAC) PF 100 MG/5ML IV SOSY
PREFILLED_SYRINGE | INTRAVENOUS | Status: DC | PRN
  Administered 2022-08-26: 100 mg via INTRAVENOUS

## 2022-08-26 MED ORDER — PROPOFOL 10 MG/ML IV BOLUS
INTRAVENOUS | Status: DC | PRN
Start: 2022-08-26 — End: 2022-08-26
  Administered 2022-08-26: 30 mg via INTRAVENOUS
  Administered 2022-08-26: 80 mg via INTRAVENOUS
  Administered 2022-08-26 (×4): 30 mg via INTRAVENOUS

## 2022-08-26 SURGICAL SUPPLY — 21 items

## 2022-08-26 NOTE — H&P (Signed)
Jacqueline Minium, MD [facility] [address] [place], [place] [address] Phone: [phone] Fax : [phone]  Primary Care Physician:  [facility], [place] Primary Gastroenterologist:  Dr. Servando Snare  Pre-Procedure History & Physical: HPI:  Jacqueline Padilla is a 47 y.o. female is here for a screening colonoscopy.   Past Medical History:  Diagnosis Date   Anemia    Anxiety    Asthma    Bipolar disorder, mixed (HCC)    Depression    Diabetes mellitus without complication (HCC)    Generalized anxiety disorder    Herpes    1/2 +   History of chicken pox    History of urinary tract infection    Insomnia    Iron deficiency    Vitamin D deficiency     Past Surgical History:  Procedure Laterality Date   DILATION AND CURETTAGE OF UTERUS  2001   s/p miscarriage   ENDOMETRIAL ABLATION  2006   hx of abnormal pap   TONSILLECTOMY AND ADENOIDECTOMY  1994   TUBAL LIGATION  2006    Prior to Admission medications   Medication Sig Start Date End Date Taking? Authorizing Provider  metFORMIN (GLUCOPHAGE-XR) 500 MG 24 hr tablet Take 1 tablet (500 mg total) by mouth daily with breakfast. 07/15/22  Yes Dana Allan, MD  rosuvastatin (CRESTOR) 5 MG tablet Take 1 tablet (5 mg total) by mouth at bedtime. 07/15/22  Yes Dana Allan, MD  Vitamin D, Ergocalciferol, (DRISDOL) 1.25 MG (50000 UNIT) CAPS capsule Take 1 capsule (50,000 Units total) by mouth every 7 (seven) days. 07/15/22  Yes Dana Allan, MD  ondansetron (ZOFRAN) 4 MG tablet Take 4 mg by mouth once a week. With ozempic on Saturday Patient not taking: Reported on 03/08/2020    [provider]    Allergies as of 07/08/2022   (No Known Allergies)    Family History  Problem Relation Age of Onset   Diabetes Mother    Hypertension Mother    Hypertension Father    Heart disease Father        CABG   Bipolar disorder Sister    Anxiety disorder Brother    Depression Brother    Stroke Maternal Grandmother    Cancer  Maternal Grandfather        Lung cancer - non smoker   Breast cancer Paternal Grandmother        age unknown   Bipolar disorder Cousin    Stomach cancer Other        maternal uncle   Ovarian cancer Neg Hx    Colon cancer Neg Hx     Social History   Socioeconomic History   Marital status: Married    Spouse name: Not on file   Number of children: 1   Years of education: 15   Highest education level: Not on file  Occupational History   Occupation: Unit Clerk    Comment: [employer]   Occupation: Advertising copywriter    Comment: [employer]  Tobacco Use   Smoking status: Former    Current packs/day: 0.25    Average packs/day: 0.3 packs/day for 18.0 years (4.5 ttl pk-yrs)    Types: Cigarettes    Start date: 07/14/2017   Smokeless tobacco: Never  Vaping Use   Vaping status: Never Used  Substance and Sexual Activity   Alcohol use: Yes    Alcohol/week: 0.0 standard drinks of alcohol    Comment: Occassional glass of wine   Drug use: No  Sexual activity: Yes    Partners: Male    Birth control/protection: Surgical, I.U.D.    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 She lives in [place] with her Husband and their son. Her husband has 3 daughters that live with them as well. She has 44 y.o son as of 06/2017. 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 She enjoys spending time with the family, outdoor activities, shopping.   Married x 16 years       Exercise - not currently   Caffeine - 1 cup daily   Social Determinants of Health   Financial Resource Strain: Not on file  Food Insecurity: Not on file  Transportation Needs: Not on file  Physical Activity: Not on file  Stress: Not on file  Social Connections: Not on file  Intimate Partner Violence: Not on file    Review of Systems: See  HPI, otherwise negative ROS  Physical Exam: BP (!) 129/91   Temp (!) 96.8 F (36 C) (Temporal)   Resp 11   Ht 5' 0.98" (1.549 m)   Wt 76.4 kg   SpO2 97%   BMI 31.84 kg/m  General:   Alert,  pleasant and cooperative in NAD Head:  Normocephalic and atraumatic. Neck:  Supple; no masses or thyromegaly. Lungs:  Clear throughout to auscultation.    Heart:  Regular rate and rhythm. Abdomen:  Soft, nontender and nondistended. Normal bowel sounds, without guarding, and without rebound.   Neurologic:  Alert and  oriented x4;  grossly normal neurologically.  Impression/Plan: [name] is now here to undergo a screening colonoscopy.  Risks, benefits, and alternatives regarding colonoscopy have been reviewed with the patient.  Questions have been answered.  All parties agreeable.

## 2022-08-26 NOTE — Op Note (Signed)
[facility] Gastroenterology Patient Name: Jacqueline Padilla Procedure Date: 08/26/2022 9:51 AM MRN: [id number] Account #: [id number] Date of Birth: 1975/07/06 Admit Type: Outpatient Age: 47 Room: [facility] OR ROOM 01 Gender: Female Note Status: Finalized Instrument Name: [id number] Procedure:             Colonoscopy Indications:           Screening for colorectal malignant neoplasm Providers:             Midge Minium MD, MD Medicines:             Propofol per Anesthesia Complications:         No immediate complications. Procedure:             Pre-Anesthesia Assessment:                        - Prior to the procedure, a History and Physical was                         performed, and patient medications and allergies were                         reviewed. The patient's tolerance of previous                         anesthesia was also reviewed. The risks and benefits                         of the procedure and the sedation options and risks                         were discussed with the patient. All questions were                         answered, and informed consent was obtained. Prior                         Anticoagulants: The patient has taken no anticoagulant                         or antiplatelet agents. ASA Grade Assessment: II - A                         patient with mild systemic disease. After reviewing                         the risks and benefits, the patient was deemed in                         satisfactory condition to undergo the procedure.                        After obtaining informed consent, the colonoscope was                         passed under direct vision. Throughout the procedure,                         the patient's blood pressure,  pulse, and oxygen                         saturations were monitored continuously. The                         Colonoscope was introduced through the anus and                         advanced to the the cecum,  identified by appendiceal                         orifice and ileocecal valve. The colonoscopy was                         performed without difficulty. The patient tolerated                         the procedure well. The quality of the bowel                         preparation was excellent. Findings:      The perianal and digital rectal examinations were normal.      A 3 mm polyp was found in the sigmoid colon. The polyp was sessile. The       polyp was removed with a cold snare. Resection and retrieval were       complete.      A few small-mouthed diverticula were found in the sigmoid colon. Impression:            - One 3 mm polyp in the sigmoid colon, removed with a                         cold snare. Resected and retrieved.                        - Diverticulosis in the sigmoid colon. Recommendation:        - Discharge patient to home.                        - Resume previous diet.                        - Continue present medications.                        - Await pathology results.                        - If the pathology report reveals adenomatous tissue,                         then repeat the colonoscopy for surveillance in 7                         years othrweise 10 years. Procedure Code(s):     --- Professional ---                        [phone], Colonoscopy, flexible; with removal of  tumor(s), polyp(s), or other lesion(s) by snare                         technique Diagnosis Code(s):     --- Professional ---                        Z12.11, Encounter for screening for malignant neoplasm                         of colon                        D12.5, Benign neoplasm of sigmoid colon CPT copyright 2022 American Medical Association. All rights reserved. The codes documented in this report are preliminary and upon coder review may  be revised to meet current compliance requirements. Midge Minium MD, MD 08/26/2022 10:15:46 AM This report has been signed  electronically. Number of Addenda: 0 Note Initiated On: 08/26/2022 9:51 AM Scope Withdrawal Time: 0 hours 9 minutes 52 seconds  Total Procedure Duration: 0 hours 12 minutes 51 seconds  Estimated Blood Loss:  Estimated blood loss: none.      [facility]

## 2022-08-26 NOTE — Transfer of Care (Signed)
Immediate Anesthesia Transfer of Care Note  Patient: Jacqueline Padilla  Procedure(s) Performed: COLONOSCOPY WITH PROPOFOL  Patient Location: PACU  Anesthesia Type: General  Level of Consciousness: awake, alert  and patient cooperative  Airway and Oxygen Therapy: Patient Spontanous Breathing and Patient connected to supplemental oxygen  Post-op Assessment: Post-op Vital signs reviewed, Patient's Cardiovascular Status Stable, Respiratory Function Stable, Patent Airway and No signs of Nausea or vomiting  Post-op Vital Signs: Reviewed and stable  Complications: No notable events documented.

## 2022-08-26 NOTE — Anesthesia Postprocedure Evaluation (Signed)
Anesthesia Post Note  Patient: Jacqueline Padilla  Procedure(s) Performed: COLONOSCOPY WITH PROPOFOL POLYPECTOMY (Rectum)  Patient location during evaluation: PACU Anesthesia Type: General Level of consciousness: awake and alert Pain management: pain level controlled Vital Signs Assessment: post-procedure vital signs reviewed and stable Respiratory status: spontaneous breathing, nonlabored ventilation, respiratory function stable and patient connected to nasal cannula oxygen Cardiovascular status: blood pressure returned to baseline and stable Postop Assessment: no apparent nausea or vomiting Anesthetic complications: no   No notable events documented.   Last Vitals:  Vitals:   08/26/22 1017 08/26/22 1030  BP: (!) 117/90 (!) 119/95  Pulse: 93 78  Resp: 16 14  Temp: (!) 36.2 C   SpO2: 96% 98%    Last Pain:  Vitals:   08/26/22 1030  TempSrc:   PainSc: 0-No pain                 Jacqueline Padilla C Celinda Dethlefs

## 2022-08-27 ENCOUNTER — Encounter: Payer: Self-pay | Admitting: Gastroenterology

## 2022-08-29 ENCOUNTER — Encounter: Payer: Self-pay | Admitting: Gastroenterology

## 2022-09-26 ENCOUNTER — Encounter: Payer: Self-pay | Admitting: Internal Medicine

## 2022-09-26 ENCOUNTER — Ambulatory Visit: Payer: Medicaid Other | Admitting: Internal Medicine

## 2022-09-26 VITALS — BP 126/84 | HR 111 | Temp 98.9°F | Wt 171.7 lb

## 2022-09-26 DIAGNOSIS — Z7984 Long term (current) use of oral hypoglycemic drugs: Secondary | ICD-10-CM | POA: Diagnosis not present

## 2022-09-26 DIAGNOSIS — E782 Mixed hyperlipidemia: Secondary | ICD-10-CM | POA: Diagnosis not present

## 2022-09-26 DIAGNOSIS — M7502 Adhesive capsulitis of left shoulder: Secondary | ICD-10-CM | POA: Diagnosis not present

## 2022-09-26 DIAGNOSIS — E1165 Type 2 diabetes mellitus with hyperglycemia: Secondary | ICD-10-CM | POA: Diagnosis not present

## 2022-09-26 DIAGNOSIS — Z23 Encounter for immunization: Secondary | ICD-10-CM

## 2022-09-26 DIAGNOSIS — E559 Vitamin D deficiency, unspecified: Secondary | ICD-10-CM | POA: Diagnosis not present

## 2022-09-26 MED ORDER — OZEMPIC (0.25 OR 0.5 MG/DOSE) 2 MG/3ML ~~LOC~~ SOPN: 0.2500 mg | PEN_INJECTOR | SUBCUTANEOUS | 2 refills | Status: DC

## 2022-09-26 MED ORDER — METFORMIN HCL ER 500 MG PO TB24
500.0000 mg | ORAL_TABLET | Freq: Every day | ORAL | 1 refills | Status: DC
Start: 2022-09-26 — End: 2022-12-26

## 2022-09-26 NOTE — Patient Instructions (Addendum)
It was great seeing you today!  Plan discussed at today's visit: -Start Ozempic 0.25 mg once weekly -Recommend over the counter Vitamin D 1000 international units daily -Recommend Naproxen 500 mg twice daily for 10 days for shoulder with food, can take muscle relaxers -Flu and Prenvar 20 vaccine today    Follow up in:  Take care and let us know if you have any questions or concerns prior to your next visit.  Dr. Mabeline Caras Capsulitis  Adhesive capsulitis, also called frozen shoulder, causes the shoulder to become stiff and painful to move. This condition happens when there is inflammation of the tendons and ligaments that surround the shoulder joint (shoulder capsule). Tendons are tissues that connect muscle to bone. Ligaments are tissues that connect bones to each other. What are the causes? This condition may be caused by: An injury to your shoulder joint. Straining your shoulder. Not moving your shoulder for a period of time. This can happen if your arm was injured or in a sling. In some cases, the cause is not known. What increases the risk? You are more likely to develop this condition if: You are female. You are older than 46 years of age. You have certain other conditions, such as: Diabetes. Thyroid problems. Stroke. You recently had surgery, especially shoulder or neck surgery. What are the signs or symptoms? Symptoms of this condition include: Pain in your shoulder when you move your arm. There may also be pain when parts of your shoulder are touched. The pain may be worse at night or when you are resting. Not being able to move your shoulder normally. Sudden muscle tightening (muscle spasms). How is this diagnosed? This condition is diagnosed with a physical exam and imaging tests, such as an X-ray or MRI. How is this treated? This condition may be treated with: Treatment of the injury or condition that caused the adhesive capsulitis. Medicines for pain,  inflammation, or muscle spasms. Injections of medicine (steroids) into the shoulder joint. Physical therapy. This involves doing exercises to get the shoulder moving again. Shoulder manipulation. This is a procedure to move the shoulder into another position. It is done after you are given a medicine to make you fall asleep (general anesthesia). The joint may also be injected with salt water at high pressure to break down scarring. Surgery. This may be done in severe cases when other treatments do not work. Some less common treatments include: Injection of hyaluronic acid into the shoulder joint. This substance is a normal part of the fluid inside joints. It helps lubricate the joint and can lower inflammation. Injection of platelet-rich plasma into the shoulder joint. This uses a type of blood cell from your own blood that may speed up the healing process. Sending energy waves to the affected area (extracorporeal shock wave therapy). Most people recover completely from adhesive capsulitis, but some may not get back full shoulder movement. Follow these instructions at home: Managing pain, stiffness, and swelling     If told, put ice on the injured area. Put ice in a plastic bag. Place a towel between your skin and the bag. Leave the ice on for 20 minutes, 2-3 times a day. If told, apply heat to the affected area before you exercise. Use the heat source that your health care provider recommends, such as a moist heat pack or a heating pad. Place a towel between your skin and the heat source. Leave the heat on for 20-30 minutes. If your skin turns bright  red, remove the ice or heat right away to prevent skin damage. The risk of damage is higher if you cannot feel pain, heat, or cold. General instructions Take over-the-counter and prescription medicines only as told by your provider. If you are being treated with physical therapy, do exercises as told. Avoid activities or exercises that put a  lot of demand on your shoulder, such as throwing. These can make pain worse. Contact a health care provider if: You have new symptoms. Your symptoms get worse. This information is not intended to replace advice given to you by your health care provider. Make sure you discuss any questions you have with your health care provider. Document Revised: 10/15/2021 Document Reviewed: 10/15/2021 Elsevier Patient Education  2024 [employer].

## 2022-09-26 NOTE — Progress Notes (Signed)
New Patient Office Visit  Subjective    Patient ID: Jacqueline Padilla, female    DOB: [birth year]  Age: 47 y.o. MRN: [id number]  CC:  Chief Complaint  Patient presents with   Establish Care   Shoulder Pain    Left onset few months, can't lift arm over head, states comes and goes   Diabetes    Wants to see about getting on ozempic and metformin increase    HPI Jacqueline Padilla presents to establish care.  Is having shoulder pain, more on the anterior side but can radiate down into elbow. Will wake up stiff, sometimes has a hard falling asleep because of it. ROM issues but on and off, sometimes has a hard time fastening bra but other times ok. Took a muscle relaxer and Ibuprofen which helps a little bit.   HLD: -Medications: Crestor 5 mg, new since last labs -Patient is compliant with above medications and reports no side effects.  -Last lipid panel: Lipid Panel     Component Value Date/Time   CHOL 228 (H) 07/08/2022 1638   TRIG 75.0 07/08/2022 1638   HDL 47.40 07/08/2022 1638   CHOLHDL 5 07/08/2022 1638   VLDL 15.0 07/08/2022 1638   LDLCALC 166 (H) 07/08/2022 1638   LDLCALC 126 (H) 06/19/2017 1008    The 10-year ASCVD risk score (Arnett DK, et al., 2019) is: 4%   Values used to calculate the score:     Age: 6 years     Sex: Female     Is Non-Hispanic African American: Yes     Diabetic: Yes     Tobacco smoker: No     Systolic Blood Pressure: 126 mmHg     Is BP treated: No     HDL Cholesterol: 47.4 mg/dL     Total Cholesterol: 228 mg/dL   Diabetes, Type 2: -Last A1c 6.9% 6/24 -Medications: Metformin 500 XR mg, had been on Ozempic previously and did well, wants to go back on -Patient is compliant with the above medications and reports no side effects.  -Eye exam: [facility], appointment 9/24 -Foot exam: Due  -Microalbumin: UTD 6/24 -Statin: yes -PNA vaccine: Due todau -Denies symptoms of hypoglycemia, polyuria, polydipsia, numbness extremities, foot  ulcers/trauma.   Vitamin D Deficiency:  -Currently on high dose supplements, has one pill left  Health Maintenance: -Blood work UTD -Mammogram 6/24 Birads-1 -Pap 7/21 negative   Outpatient Encounter Medications as of 09/26/2022  Medication Sig   metFORMIN (GLUCOPHAGE-XR) 500 MG 24 hr tablet Take 1 tablet (500 mg total) by mouth daily with breakfast.   rosuvastatin (CRESTOR) 5 MG tablet Take 1 tablet (5 mg total) by mouth at bedtime.   Vitamin D, Ergocalciferol, (DRISDOL) 1.25 MG (50000 UNIT) CAPS capsule Take 1 capsule (50,000 Units total) by mouth every 7 (seven) days.   [DISCONTINUED] ondansetron (ZOFRAN) 4 MG tablet Take 4 mg by mouth once a week. With ozempic on Saturday (Patient not taking: Reported on 03/08/2020)   No facility-administered encounter medications on file as of 09/26/2022.    Past Medical History:  Diagnosis Date   Anemia    Anxiety    Asthma    Bipolar disorder, mixed (HCC)    Depression    Diabetes mellitus without complication (HCC)    Generalized anxiety disorder    Herpes    1/2 +   History of chicken pox    History of urinary tract infection    Insomnia    Iron deficiency  Vitamin D deficiency     Past Surgical History:  Procedure Laterality Date   COLONOSCOPY WITH PROPOFOL N/A 08/26/2022   Procedure: COLONOSCOPY WITH PROPOFOL;  Surgeon: Midge Minium, MD;  Location: [facility];  Service: Endoscopy;  Laterality: N/A;   DILATION AND CURETTAGE OF UTERUS  2001   s/p miscarriage   ENDOMETRIAL ABLATION  2006   hx of abnormal pap   POLYPECTOMY  08/26/2022   Procedure: POLYPECTOMY;  Surgeon: Midge Minium, MD;  Location: [facility];  Service: Endoscopy;;   TONSILLECTOMY AND ADENOIDECTOMY  1994   TUBAL LIGATION  2006    Family History  Problem Relation Age of Onset   Diabetes Mother    Hypertension Mother    Hypertension Father    Heart disease Father        CABG   Bipolar disorder Sister    Anxiety disorder Brother     Depression Brother    Stroke Maternal Grandmother    Cancer Maternal Grandfather        Lung cancer - non smoker   Breast cancer Paternal Grandmother        age unknown   Bipolar disorder Cousin    Stomach cancer Other        maternal uncle   Ovarian cancer Neg Hx    Colon cancer Neg Hx     Social History   Socioeconomic History   Marital status: Married    Spouse name: Not on file   Number of children: 1   Years of education: 15   Highest education level: Not on file  Occupational History   Occupation: Unit Clerk    Comment: [employer]   Occupation: Advertising copywriter    Comment: [employer]  Tobacco Use   Smoking status: Former    Current packs/day: 0.25    Average packs/day: 0.3 packs/day for 18.1 years (4.5 ttl pk-yrs)    Types: Cigarettes    Start date: 07/14/2017   Smokeless tobacco: Never  Vaping Use   Vaping status: Never Used  Substance and Sexual Activity   Alcohol use: Yes    Alcohol/week: 0.0 standard drinks of alcohol    Comment: Occassional glass of wine   Drug use: No   Sexual activity: Yes    Partners: Male    Birth control/protection: Surgical, I.U.D.    Comment: Mirena/Tubal ligation  Other Topics Concern   Not on file  Social History Narrative   Jacqueline Padilla grew up in [place], [place]. She lives in [place] with her Husband and their son. Her husband has 3 daughters that live with them as well. She has 48 y.o son as of 06/2017. 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 She enjoys spending time with the family, outdoor activities, shopping.   Married x 16 years       Exercise - not currently   Caffeine - 1 cup daily   Social Determinants of Health   Financial Resource Strain: Not on file  Food Insecurity: Not on file  Transportation Needs: Not on file  Physical Activity: Not on file  Stress: Not on file  Social Connections: Not on file   Intimate Partner Violence: Not on file    Review of Systems  Constitutional:  Negative for chills and fever.  Eyes:  Negative for blurred vision.  Respiratory:  Negative for shortness of breath.   Cardiovascular:  Negative for  chest pain.  Musculoskeletal:  Positive for joint pain.        Objective    BP 126/84   Pulse (!) 111   Temp 98.9 F (37.2 C)   Wt 171 lb 11.2 oz (77.9 kg)   SpO2 96%   BMI 32.46 kg/m   Physical Exam Constitutional:      Appearance: Normal appearance.  HENT:     Head: Normocephalic and atraumatic.     Mouth/Throat:     Mouth: Mucous membranes are moist.     Pharynx: Oropharynx is clear.  Eyes:     Extraocular Movements: Extraocular movements intact.     Conjunctiva/sclera: Conjunctivae normal.     Pupils: Pupils are equal, round, and reactive to light.  Neck:     Comments: No thyromegaly  Cardiovascular:     Rate and Rhythm: Normal rate and regular rhythm.     Pulses:          Dorsalis pedis pulses are 2+ on the right side and 2+ on the left side.  Pulmonary:     Effort: Pulmonary effort is normal.     Breath sounds: Normal breath sounds.  Musculoskeletal:     Left shoulder: Bony tenderness and crepitus present. No swelling or tenderness. Normal range of motion.     Cervical back: No tenderness.     Right foot: Normal range of motion. No deformity, bunion, Charcot foot, foot drop or prominent metatarsal heads.     Left foot: Normal range of motion. No deformity, bunion, Charcot foot, foot drop or prominent metatarsal heads.  Feet:     Right foot:     Protective Sensation: 6 sites tested.  6 sites sensed.     Skin integrity: Skin integrity normal.     Toenail Condition: Right toenails are normal.     Left foot:     Protective Sensation: 6 sites tested.  6 sites sensed.     Skin integrity: Skin integrity normal.     Toenail Condition: Left toenails are normal.  Lymphadenopathy:     Cervical: No cervical adenopathy.  Skin:     General: Skin is warm and dry.  Neurological:     General: No focal deficit present.     Mental Status: She is alert. Mental status is at baseline.  Psychiatric:        Mood and Affect: Mood normal.        Behavior: Behavior normal.         Assessment & Plan:   1. Type 2 diabetes mellitus with hyperglycemia, without long-term current use of insulin (HCC): Controlled, will continue Metformin 500 mg once daily, add Ozempic 0.25 mg weekly. Recheck A1c in 3 months. Foot exam today.  - metFORMIN (GLUCOPHAGE-XR) 500 MG 24 hr tablet; Take 1 tablet (500 mg total) by mouth daily with breakfast.  Dispense: 90 tablet; Refill: 1 - Semaglutide,0.25 or 0.5MG /DOS, (OZEMPIC, 0.25 OR 0.5 MG/DOSE,) 2 MG/3ML SOPN; Inject 0.25 mg into the skin once a week.  Dispense: 3 mL; Refill: 2 - [facility] Diabetes Foot Exam  2. Mixed hyperlipidemia: Reviewed cholesterol panel with the patient from June, had been started on Crestor 5 mg after those labs. Plan to continue Crestor, recheck labs next June.  3. Vitamin D deficiency: About to finish high dose prescription strength dose, recommend starting over the counter 1000 daily.  4. Adhesive capsulitis of left shoulder: Discussed using NSAID's as needed. Thinks she has some Naproxen at home, recommend 500 mg BID for  10 days. May require PT.  5. Vaccine for streptococcus pneumoniae and influenza: Flu and Prevnar 20 vaccines given today.  - Flu vaccine trivalent PF, 6mos and older(Flulaval,Afluria,Fluarix,Fluzone) - Pneumococcal conjugate vaccine 20-valent (Prevnar 20)   Return in about 3 months (around 12/26/2022).   Margarita Mail, DO

## 2022-09-30 NOTE — Telephone Encounter (Signed)
Copied from CRM [id number]. Topic: General - Other >> Sep 30, 2022  9:13 AM Phill Myron wrote: Mrs Jerilee Hoh stated prior authorization is needed for the Ozempic rx.  Please advise

## 2022-10-03 DIAGNOSIS — Q100 Congenital ptosis: Secondary | ICD-10-CM | POA: Diagnosis not present

## 2022-10-15 ENCOUNTER — Ambulatory Visit: Payer: Self-pay | Admitting: Internal Medicine

## 2022-12-25 NOTE — Progress Notes (Unsigned)
   Established Patient Office Visit  Subjective   Patient ID: Jacqueline Padilla, female    DOB: 06-Jun-1975  Age: 47 y.o. MRN: [id number]  No chief complaint on file.   HPI  HLD: -Medications: Crestor 5 mg, new since last labs -Patient is compliant with above medications and reports no side effects.  -Last lipid panel: Lipid Panel     Component Value Date/Time   CHOL 228 (H) 07/08/2022 1638   TRIG 75.0 07/08/2022 1638   HDL 47.40 07/08/2022 1638   CHOLHDL 5 07/08/2022 1638   VLDL 15.0 07/08/2022 1638   LDLCALC 166 (H) 07/08/2022 1638   LDLCALC 126 (H) 06/19/2017 1008    The 10-year ASCVD risk score (Arnett DK, et al., 2019) is: 4%   Values used to calculate the score:     Age: 63 years     Sex: Female     Is Non-Hispanic African American: Yes     Diabetic: Yes     Tobacco smoker: No     Systolic Blood Pressure: 126 mmHg     Is BP treated: No     HDL Cholesterol: 47.4 mg/dL     Total Cholesterol: 228 mg/dL   Diabetes, Type 2: -Last A1c 6.9% 6/24 -Medications: Metformin 500 XR mg, restarted Ozempic 0.25 mg previously  -Patient is compliant with the above medications and reports no side effects.  -Eye exam: [facility], appointment 9/24 -Foot exam: Due  -Microalbumin: UTD 6/24 -Statin: yes -PNA vaccine: Due todau -Denies symptoms of hypoglycemia, polyuria, polydipsia, numbness extremities, foot ulcers/trauma.   Vitamin D Deficiency:  -Currently on high dose supplements, has one pill left  Health Maintenance: -Blood work UTD -Mammogram 6/24 Birads-1 -Pap 7/21 negative   {History (Optional):23778}  ROS    Objective:     There were no vitals taken for this visit. {Vitals History (Optional):23777}  Physical Exam   No results found for any visits on 12/26/22.  {Labs (Optional):23779}  The 10-year ASCVD risk score (Arnett DK, et al., 2019) is: 4%    Assessment & Plan:  There are no diagnoses linked to this encounter.   No follow-ups on file.     Margarita Mail, DO

## 2022-12-26 ENCOUNTER — Ambulatory Visit: Payer: Medicaid Other | Admitting: Internal Medicine

## 2022-12-26 ENCOUNTER — Other Ambulatory Visit: Payer: Self-pay

## 2022-12-26 ENCOUNTER — Encounter: Payer: Self-pay | Admitting: Internal Medicine

## 2022-12-26 VITALS — BP 132/74 | HR 104 | Temp 98.0°F | Resp 16 | Ht 61.0 in | Wt 174.4 lb

## 2022-12-26 DIAGNOSIS — Z7985 Long-term (current) use of injectable non-insulin antidiabetic drugs: Secondary | ICD-10-CM

## 2022-12-26 DIAGNOSIS — E1165 Type 2 diabetes mellitus with hyperglycemia: Secondary | ICD-10-CM

## 2022-12-26 DIAGNOSIS — E782 Mixed hyperlipidemia: Secondary | ICD-10-CM | POA: Diagnosis not present

## 2022-12-26 DIAGNOSIS — E559 Vitamin D deficiency, unspecified: Secondary | ICD-10-CM

## 2022-12-26 DIAGNOSIS — Z7984 Long term (current) use of oral hypoglycemic drugs: Secondary | ICD-10-CM | POA: Diagnosis not present

## 2022-12-26 LAB — POCT GLYCOSYLATED HEMOGLOBIN (HGB A1C): Hemoglobin A1C: 8.3 % — AB (ref 4.0–5.6)

## 2022-12-26 MED ORDER — OZEMPIC (0.25 OR 0.5 MG/DOSE) 2 MG/3ML ~~LOC~~ SOPN
0.5000 mg | PEN_INJECTOR | SUBCUTANEOUS | 1 refills | Status: DC
Start: 2022-12-26 — End: 2023-03-26

## 2022-12-26 MED ORDER — METFORMIN HCL ER 750 MG PO TB24: 750.0000 mg | ORAL_TABLET | Freq: Every day | ORAL | 1 refills | Status: DC

## 2022-12-26 NOTE — Assessment & Plan Note (Signed)
Stable, doing well on statin. Plan to recheck labs in June.

## 2022-12-26 NOTE — Assessment & Plan Note (Signed)
On Vitamin D supplements, doing well.

## 2022-12-26 NOTE — Assessment & Plan Note (Signed)
A1c increased to 8.3%. Will increase Ozempic to 0.5 mg weekly and increase Metformin to 750 mg XR daily. Recheck in 3 months.

## 2023-03-26 ENCOUNTER — Other Ambulatory Visit: Payer: Self-pay

## 2023-03-26 ENCOUNTER — Encounter: Payer: Self-pay | Admitting: Internal Medicine

## 2023-03-26 ENCOUNTER — Ambulatory Visit: Payer: Medicaid Other | Admitting: Internal Medicine

## 2023-03-26 VITALS — BP 126/82 | HR 94 | Resp 16 | Ht 61.0 in | Wt 167.7 lb

## 2023-03-26 DIAGNOSIS — E1165 Type 2 diabetes mellitus with hyperglycemia: Secondary | ICD-10-CM | POA: Diagnosis not present

## 2023-03-26 DIAGNOSIS — E782 Mixed hyperlipidemia: Secondary | ICD-10-CM

## 2023-03-26 LAB — POCT GLYCOSYLATED HEMOGLOBIN (HGB A1C): Hemoglobin A1C: 6.3 % — AB (ref 4.0–5.6)

## 2023-03-26 MED ORDER — OZEMPIC (0.25 OR 0.5 MG/DOSE) 2 MG/3ML ~~LOC~~ SOPN
0.5000 mg | PEN_INJECTOR | SUBCUTANEOUS | 1 refills | Status: DC
Start: 2023-03-26 — End: 2023-05-28

## 2023-03-26 MED ORDER — METFORMIN HCL ER 750 MG PO TB24
750.0000 mg | ORAL_TABLET | Freq: Every day | ORAL | 1 refills | Status: DC
Start: 2023-03-26 — End: 2023-09-26

## 2023-03-26 NOTE — Progress Notes (Signed)
 Established Patient Office Visit  Subjective   Patient ID: Jacqueline Padilla, female    DOB: 1975/09/23  Age: 48 y.o. MRN: [id number]  Chief Complaint  Patient presents with   Medical Management of Chronic Issues    3 month recheck    HPI  Patient here for follow up on chronic medical conditions. Doing well. Had eyelid surgery in December, still having some vision problems but going back to her surgeon next month.  HLD: -Medications: Crestor 5 mg, new since last labs -Patient is compliant with above medications and reports no side effects.  -Last lipid panel: Lipid Panel   Lipid Panel     Component Value Date/Time   CHOL 228 (H) 07/08/2022 1638   TRIG 75.0 07/08/2022 1638   HDL 47.40 07/08/2022 1638   CHOLHDL 5 07/08/2022 1638   VLDL 15.0 07/08/2022 1638   LDLCALC 166 (H) 07/08/2022 1638   LDLCALC 126 (H) 06/19/2017 1008   Diabetes, Type 2: -Last A1c 6.9% 6/24 -Medications: Metformin increased to 750 XR mg, Ozempic 0.5 mg weekly  -Compliant with the above medications and reports no side effects.  -Eye exam: UTD [facility] 9/24 -Foot exam: UTD -Microalbumin: UTD 6/24 -Statin: yes -PNA vaccine: UTD -Denies symptoms of hypoglycemia, polyuria, polydipsia, numbness extremities, foot ulcers/trauma.   Vitamin D Deficiency:  -Finished high dose supplements, now on OCT 1000 international units  daily   Health Maintenance: -Blood work UTD -Mammogram 6/24 Birads-1 -Pap 7/21 negative   Patient Active Problem List   Diagnosis Date Noted   Type 2 diabetes mellitus with hyperglycemia, without long-term current use of insulin (HCC) 12/26/2022   Polyp of sigmoid colon 08/26/2022   Encounter for screening colonoscopy 08/26/2022   Obesity (BMI 30-39.9) 08/07/2022   IUD (intrauterine device) in place 07/26/2019   Depression, recurrent (HCC) 06/23/2019   Abnormal uterine bleeding 04/17/2018   Insomnia 02/04/2018   Snoring 02/04/2018   GAD (generalized anxiety disorder)  07/10/2017   Iron deficiency anemia 07/10/2017   Vitamin D deficiency 06/23/2017   Tobacco abuse 06/18/2017   Anxiety and depression 06/18/2017   Anemia 06/18/2017   STD exposure 05/30/2017   Muscle spasm 05/30/2017   Mixed hyperlipidemia 06/01/2015   Type 2 diabetes with complication (HCC) 06/01/2015   Diabetic eye exam (HCC) 03/08/2014   Hx of abnormal cervical Pap smear 05/05/2013   Past Medical History:  Diagnosis Date   Anemia    Anxiety    Asthma    Bipolar disorder, mixed (HCC)    Depression    Diabetes mellitus without complication (HCC)    Generalized anxiety disorder    Herpes    1/2 +   History of chicken pox    History of urinary tract infection    Insomnia    Iron deficiency    Vitamin D deficiency    Past Surgical History:  Procedure Laterality Date   BELPHAROPTOSIS REPAIR Bilateral    eye lid surgery   COLONOSCOPY WITH PROPOFOL N/A 08/26/2022   Procedure: COLONOSCOPY WITH PROPOFOL;  Surgeon: Midge Minium, MD;  Location: [facility];  Service: Endoscopy;  Laterality: N/A;   DILATION AND CURETTAGE OF UTERUS  01/15/1999   s/p miscarriage   ENDOMETRIAL ABLATION  01/15/2004   hx of abnormal pap   POLYPECTOMY  08/26/2022   Procedure: POLYPECTOMY;  Surgeon: Midge Minium, MD;  Location: [facility];  Service: Endoscopy;;   TONSILLECTOMY AND ADENOIDECTOMY  01/15/1992   TUBAL LIGATION  01/15/2004   Social History  Tobacco Use   Smoking status: Former    Current packs/day: 0.25    Average packs/day: 0.3 packs/day for 18.6 years (4.6 ttl pk-yrs)    Types: Cigarettes    Start date: 07/14/2017   Smokeless tobacco: Never  Vaping Use   Vaping status: Never Used  Substance Use Topics   Alcohol use: Yes    Alcohol/week: 0.0 standard drinks of alcohol    Comment: Occassional glass of wine   Drug use: No   Social History   Socioeconomic History   Marital status: Married    Spouse name: Not on file   Number of children: 1   Years of  education: 15   Highest education level: Not on file  Occupational History   Occupation: Unit Clerk    Comment: [employer]   Occupation: Advertising copywriter    Comment: [employer]  Tobacco Use   Smoking status: Former    Current packs/day: 0.25    Average packs/day: 0.3 packs/day for 18.6 years (4.6 ttl pk-yrs)    Types: Cigarettes    Start date: 07/14/2017   Smokeless tobacco: Never  Vaping Use   Vaping status: Never Used  Substance and Sexual Activity   Alcohol use: Yes    Alcohol/week: 0.0 standard drinks of alcohol    Comment: Occassional glass of wine   Drug use: No   Sexual activity: Yes    Partners: Male    Birth control/protection: Surgical, I.U.D.    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 She lives in [place] with her Husband and their son. Her husband has 3 daughters that live with them as well. She has 32 y.o son as of 06/2017. 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 She enjoys spending time with the family, outdoor activities, shopping.   Married x 16 years       Exercise - not currently   Caffeine - 1 cup daily   Social Drivers of Corporate investment banker Strain: Not on file  Food Insecurity: Not on file  Transportation Needs: Not on file  Physical Activity: Not on file  Stress: Not on file  Social Connections: Not on file  Intimate Partner Violence: Not on file   Family Status  Relation Name Status   Mother  Alive   Father  Alive   Sister  Alive   Brother  Alive   MGM  (Not Specified)   MGF  Deceased   PGM  Deceased   Son  Optician, dispensing  (Not Specified)   Other  (Not Specified)   Neg Hx  (Not Specified)  No partnership data on file   Family History  Problem Relation Age of Onset   Diabetes Mother    Hypertension Mother    Hypertension Father     Heart disease Father        CABG   Bipolar disorder Sister    Anxiety disorder Brother    Depression Brother    Stroke Maternal Grandmother    Cancer Maternal Grandfather        Lung cancer - non smoker   Breast cancer Paternal Grandmother        age unknown   Bipolar disorder Cousin    Stomach cancer Other  maternal uncle   Ovarian cancer Neg Hx    Colon cancer Neg Hx    No Known Allergies    Review of Systems  All other systems reviewed and are negative.     Objective:     BP 126/82 (Cuff Size: Normal)   Pulse 94   Resp 16   Ht 5\' 1"  (1.549 m)   Wt 167 lb 11.2 oz (76.1 kg)   SpO2 98%   BMI 31.69 kg/m  BP Readings from Last 3 Encounters:  03/26/23 126/82  12/26/22 132/74  09/26/22 126/84   Wt Readings from Last 3 Encounters:  03/26/23 167 lb 11.2 oz (76.1 kg)  12/26/22 174 lb 6.4 oz (79.1 kg)  09/26/22 171 lb 11.2 oz (77.9 kg)      Physical Exam Constitutional:      Appearance: Normal appearance.  HENT:     Head: Normocephalic and atraumatic.     Mouth/Throat:     Mouth: Mucous membranes are moist.     Pharynx: Oropharynx is clear.  Eyes:     Extraocular Movements: Extraocular movements intact.     Conjunctiva/sclera: Conjunctivae normal.     Pupils: Pupils are equal, round, and reactive to light.  Neck:     Comments: No thyromegaly Cardiovascular:     Rate and Rhythm: Normal rate and regular rhythm.  Pulmonary:     Effort: Pulmonary effort is normal.     Breath sounds: Normal breath sounds.  Lymphadenopathy:     Cervical: No cervical adenopathy.  Skin:    General: Skin is warm and dry.  Neurological:     General: No focal deficit present.     Mental Status: She is alert. Mental status is at baseline.  Psychiatric:        Mood and Affect: Mood normal.        Behavior: Behavior normal.      Results for orders placed or performed in visit on 03/26/23  POCT HgB A1C  Result Value Ref Range   Hemoglobin A1C 6.3 (A) 4.0 - 5.6 %    HbA1c POC (<> result, manual entry)     HbA1c, POC (prediabetic range)     HbA1c, POC (controlled diabetic range)       Last CBC Lab Results  Component Value Date   WBC 4.7 07/08/2022   HGB 13.3 07/08/2022   HCT 41.0 07/08/2022   MCV 86.6 07/08/2022   MCH 25.0 (L) 12/07/2018   RDW 13.7 07/08/2022   PLT 369.0 07/08/2022   Last metabolic panel Lab Results  Component Value Date   GLUCOSE 92 07/08/2022   NA 139 07/08/2022   K 3.9 07/08/2022   CL 103 07/08/2022   CO2 25 07/08/2022   BUN 9 07/08/2022   CREATININE 0.84 07/08/2022   GFR 83.71 07/08/2022   CALCIUM 9.9 07/08/2022   PROT 8.1 07/08/2022   ALBUMIN 4.7 07/08/2022   BILITOT 0.9 07/08/2022   ALKPHOS 47 07/08/2022   AST 22 07/08/2022   ALT 20 07/08/2022   ANIONGAP 11 12/07/2018   Last lipids Lab Results  Component Value Date   CHOL 228 (H) 07/08/2022   HDL 47.40 07/08/2022   LDLCALC 166 (H) 07/08/2022   TRIG 75.0 07/08/2022   CHOLHDL 5 07/08/2022   Last hemoglobin A1c Lab Results  Component Value Date   HGBA1C 6.3 (A) 03/26/2023   Last thyroid functions Lab Results  Component Value Date   TSH 1.01 07/08/2022   Last vitamin D Lab Results  Component Value Date   VD25OH 26.52 (L) 07/08/2022   Last vitamin B12 and Folate Lab Results  Component Value Date   VITAMINB12 320 07/08/2022      The 10-year ASCVD risk score (Arnett DK, et al., 2019) is: 4%    Assessment & Plan:  Type 2 diabetes mellitus with hyperglycemia, without long-term current use of insulin (HCC) Assessment & Plan: A1c improved today to 6.3%.  No changes made to medications and refills ordered, follow-up in 3 months.  Orders: -     POCT glycosylated hemoglobin (Hb A1C) -     metFORMIN HCl ER; Take 1 tablet (750 mg total) by mouth daily with breakfast.  Dispense: 90 tablet; Refill: 1 -     Ozempic (0.25 or 0.5 MG/DOSE); Inject 0.5 mg into the skin once a week.  Dispense: 3 mL; Refill: 1  Mixed hyperlipidemia Assessment &  Plan: Stable, doing well on Crestor 5 mg.  Plan to recheck fasting labs at follow-up.       Return in about 3 months (around 06/26/2023).    Margarita Mail, DO

## 2023-03-26 NOTE — Assessment & Plan Note (Signed)
 A1c improved today to 6.3%.  No changes made to medications and refills ordered, follow-up in 3 months.

## 2023-03-26 NOTE — Assessment & Plan Note (Signed)
 Stable, doing well on Crestor 5 mg.  Plan to recheck fasting labs at follow-up.

## 2023-05-06 ENCOUNTER — Telehealth: Payer: Self-pay | Admitting: Plastic Surgery

## 2023-05-06 NOTE — Telephone Encounter (Signed)
Left a Vm

## 2023-05-23 ENCOUNTER — Institutional Professional Consult (permissible substitution): Admitting: Plastic Surgery

## 2023-05-27 ENCOUNTER — Other Ambulatory Visit: Payer: Self-pay | Admitting: Internal Medicine

## 2023-05-27 DIAGNOSIS — E1165 Type 2 diabetes mellitus with hyperglycemia: Secondary | ICD-10-CM

## 2023-05-28 ENCOUNTER — Encounter: Payer: Self-pay | Admitting: Plastic Surgery

## 2023-05-28 ENCOUNTER — Other Ambulatory Visit: Payer: Self-pay | Admitting: Internal Medicine

## 2023-05-28 ENCOUNTER — Ambulatory Visit: Admitting: Plastic Surgery

## 2023-05-28 VITALS — BP 138/91 | HR 96 | Ht 61.0 in | Wt 159.6 lb

## 2023-05-28 DIAGNOSIS — M542 Cervicalgia: Secondary | ICD-10-CM | POA: Diagnosis not present

## 2023-05-28 DIAGNOSIS — M546 Pain in thoracic spine: Secondary | ICD-10-CM

## 2023-05-28 DIAGNOSIS — N62 Hypertrophy of breast: Secondary | ICD-10-CM

## 2023-05-28 DIAGNOSIS — Z1231 Encounter for screening mammogram for malignant neoplasm of breast: Secondary | ICD-10-CM

## 2023-05-28 NOTE — Telephone Encounter (Signed)
 Requested Prescriptions  Pending Prescriptions Disp Refills   OZEMPIC , 0.25 OR 0.5 MG/DOSE, 2 MG/3ML SOPN [Pharmacy Med Name: Ozempic  (0.25 or 0.5 MG/DOSE) 2 MG/3ML Subcutaneous Solution Pen-injector] 3 mL 0    Sig: INJECT 0.5 MG SUBCUTANEOUSLY ONCE A WEEK     Endocrinology:  Diabetes - GLP-1 Receptor Agonists - semaglutide  Failed - 05/28/2023  4:28 PM      Failed - HBA1C in normal range and within 180 days    Hemoglobin A1C  Date Value Ref Range Status  03/26/2023 6.3 (A) 4.0 - 5.6 % Final  05/27/2019 6.3  Final   Hgb A1c MFr Bld  Date Value Ref Range Status  07/08/2022 6.9 (H) 4.6 - 6.5 % Final    Comment:    Glycemic Control Guidelines for People with Diabetes:Non Diabetic:  <6%Goal of Therapy: <7%Additional Action Suggested:  >8%          Passed - Cr in normal range and within 360 days    Creat  Date Value Ref Range Status  06/19/2017 0.70 0.50 - 1.10 mg/dL Final   Creatinine, Ser  Date Value Ref Range Status  07/08/2022 0.84 0.40 - 1.20 mg/dL Final   Creatinine,U  Date Value Ref Range Status  07/08/2022 152.1 mg/dL Final   Creatinine, Urine  Date Value Ref Range Status  08/03/2020 195 20 - 275 mg/dL Final         Passed - Valid encounter within last 6 months    Recent Outpatient Visits           2 months ago Type 2 diabetes mellitus with hyperglycemia, without long-term current use of insulin  [facility])   [facility] Rockney Cid, DO       Future Appointments             In 4 weeks Rockney Cid, DO [place] [facility]

## 2023-05-28 NOTE — Progress Notes (Signed)
 Referring Provider Rockney Cid, DO [address] [place],  [place] [address]   CC:  Chief Complaint  Patient presents with   Advice Only      Jacqueline Padilla is an 48 y.o. female.  HPI: [name] is a 48 year old female who presents with several years history of upper back and neck pain which she attributes to the large size of her breast.  She states that she has difficulty finding bras that fit appropriately and the ones that she does find leave grooves in her shoulders.  She does have rashes under her breast especially in the summer when she sweats more.  She does treat this with A&E ointment but the rashes return as soon as she stops using the creams.  She is interested in a bilateral breast reduction.  She is not a smoker she does have diabetes but her last hemoglobin A1c was 6.3 she does not use any type of blood thinners.  No Known Allergies  Outpatient Encounter Medications as of 05/28/2023  Medication Sig   metFORMIN  (GLUCOPHAGE -XR) 750 MG 24 hr tablet Take 1 tablet (750 mg total) by mouth daily with breakfast.   rosuvastatin  (CRESTOR ) 5 MG tablet Take 1 tablet (5 mg total) by mouth at bedtime.   Semaglutide ,0.25 or 0.5MG /DOS, (OZEMPIC , 0.25 OR 0.5 MG/DOSE,) 2 MG/3ML SOPN Inject 0.5 mg into the skin once a week.   Vitamin D , Ergocalciferol , (DRISDOL ) 1.25 MG (50000 UNIT) CAPS capsule Take 1 capsule (50,000 Units total) by mouth every 7 (seven) days.   No facility-administered encounter medications on file as of 05/28/2023.     Past Medical History:  Diagnosis Date   Anemia    Anxiety    Asthma    Bipolar disorder, mixed (HCC)    Depression    Diabetes mellitus without complication (HCC)    Generalized anxiety disorder    Herpes    1/2 +   History of chicken pox    History of urinary tract infection    Insomnia    Iron deficiency    Vitamin D  deficiency     Past Surgical History:  Procedure Laterality Date   BELPHAROPTOSIS REPAIR  Bilateral    eye lid surgery   COLONOSCOPY WITH PROPOFOL  N/A 08/26/2022   Procedure: COLONOSCOPY WITH PROPOFOL ;  Surgeon: Marnee Sink, MD;  Location: [facility];  Service: Endoscopy;  Laterality: N/A;   DILATION AND CURETTAGE OF UTERUS  01/15/1999   s/p miscarriage   ENDOMETRIAL ABLATION  01/15/2004   hx of abnormal pap   POLYPECTOMY  08/26/2022   Procedure: POLYPECTOMY;  Surgeon: Marnee Sink, MD;  Location: [facility];  Service: Endoscopy;;   TONSILLECTOMY AND ADENOIDECTOMY  01/15/1992   TUBAL LIGATION  01/15/2004    Family History  Problem Relation Age of Onset   Diabetes Mother    Hypertension Mother    Hypertension Father    Heart disease Father        CABG   Bipolar disorder Sister    Anxiety disorder Brother    Depression Brother    Stroke Maternal Grandmother    Cancer Maternal Grandfather        Lung cancer - non smoker   Breast cancer Paternal Grandmother        age unknown   Bipolar disorder Cousin    Stomach cancer Other        maternal uncle   Ovarian cancer Neg Hx    Colon cancer Neg Hx  Social History   Social History Narrative   Jacqueline Padilla grew up in [place] , [place]. She lives in [place] with her Husband and their son. Her husband has 3 daughters that live with them as well. She has 15 y.o son as of 06/2017. 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 She enjoys spending time with the family, outdoor activities, shopping.   Married x 16 years       Exercise - not currently   Caffeine - 1 cup daily     Review of Systems General: Denies fevers, chills, weight loss CV: Denies chest pain, shortness of breath, palpitations Breast: Large breasts which the patient feels is contributing to her upper back and neck pain.  She feels that they interfere with her daily activities due to the size and negatively affect her posture.  Physical Exam     05/28/2023   10:45 AM 03/26/2023    9:43 AM 12/26/2022   10:34 AM  Vitals with BMI  Height 5\' 1"  5\' 1"  5\' 1"   Weight 159 lbs 10 oz 167 lbs 11 oz 174 lbs 6 oz  BMI 30.17 31.7 32.97  Systolic 138 126 540  Diastolic 91 82 74  Pulse 96 94 104    General:  No acute distress,  Alert and oriented, Non-Toxic, Normal speech and affect Breast: Patient has large pendulous breasts.  There are no dominant masses on physical exam and the nipples are normal in appearance without evidence of nipple discharge.  Her sternal notch nipple distance on the right is 34 cm and 35 cm on the left the nipple to fold distance on the right is 15 cm and 14 cm on the left Mammogram: Mammogram in June 2024 was BI-RADS 1 Assessment/Plan Symptomatic macromastia: Patient has large breasts and I believe she would benefit from a bilateral breast reduction.  I believe that I can remove 600 g per breast.  We discussed the procedure at length.  Her husband was in the room with us  as well.  We discussed the location of the incisions and the unpredictable nature of scarring and wound healing.  We discussed the risks of bleeding, infection, and seroma formation.  She understands that it is possible that I will use drains postoperatively.  We did discuss the risk of nipple loss due to nipple ischemia.  We discussed the postoperative limitations including no heavy lifting greater than 20 pounds, no vigorous activity, no submerging incisions in water  for 6 weeks.  She understands she will need to wear a supportive compressive garment for 6 weeks.  I will ask that she return to ambulation immediately after surgery to help decrease the risk of DVT.  She may return to light activity as tolerated.  All questions were answered to her satisfaction.  Photographs were obtained today with her consent.  Will submit her for a bilateral breast reduction at her request.  [name] 05/28/2023, 1:20 PM

## 2023-06-22 ENCOUNTER — Other Ambulatory Visit: Payer: Self-pay | Admitting: Internal Medicine

## 2023-06-22 DIAGNOSIS — E1165 Type 2 diabetes mellitus with hyperglycemia: Secondary | ICD-10-CM

## 2023-06-24 NOTE — Telephone Encounter (Signed)
 Requested medication (s) are due for refill today: no   Requested medication (s) are on the active medication list: yes   Last refill:  05/28/23 #80ml 0 refills  Future visit scheduled: yes in 2 days   Notes to clinic:  no refills remain. Patient to be seen in 2 days . Do you want to refill Rx or wait until OV?     Requested Prescriptions  Pending Prescriptions Disp Refills   OZEMPIC , 0.25 OR 0.5 MG/DOSE, 2 MG/3ML SOPN [Pharmacy Med Name: Ozempic  (0.25 or 0.5 MG/DOSE) 2 MG/3ML Subcutaneous Solution Pen-injector] 3 mL 0    Sig: INJECT 0.5MG  SUBCUTANEOUSLY ONCE WEEKLY     Endocrinology:  Diabetes - GLP-1 Receptor Agonists - semaglutide  Failed - 06/24/2023  9:11 AM      Failed - HBA1C in normal range and within 180 days    Hemoglobin A1C  Date Value Ref Range Status  03/26/2023 6.3 (A) 4.0 - 5.6 % Final  05/27/2019 6.3  Final   Hgb A1c MFr Bld  Date Value Ref Range Status  07/08/2022 6.9 (H) 4.6 - 6.5 % Final    Comment:    Glycemic Control Guidelines for People with Diabetes:Non Diabetic:  <6%Goal of Therapy: <7%Additional Action Suggested:  >8%          Passed - Cr in normal range and within 360 days    Creat  Date Value Ref Range Status  06/19/2017 0.70 0.50 - 1.10 mg/dL Final   Creatinine, Ser  Date Value Ref Range Status  07/08/2022 0.84 0.40 - 1.20 mg/dL Final   Creatinine,U  Date Value Ref Range Status  07/08/2022 152.1 mg/dL Final   Creatinine, Urine  Date Value Ref Range Status  08/03/2020 195 20 - 275 mg/dL Final         Passed - Valid encounter within last 6 months    Recent Outpatient Visits           3 months ago Type 2 diabetes mellitus with hyperglycemia, without long-term current use of insulin  [facility])   [facility] Rockney Cid, DO       Future Appointments             In 2 days Rockney Cid, DO [facility]

## 2023-06-26 ENCOUNTER — Ambulatory Visit: Admitting: Internal Medicine

## 2023-06-26 ENCOUNTER — Other Ambulatory Visit: Payer: Self-pay

## 2023-06-26 ENCOUNTER — Encounter: Payer: Self-pay | Admitting: Internal Medicine

## 2023-06-26 VITALS — BP 132/80 | HR 93 | Temp 98.0°F | Resp 16 | Ht 61.0 in | Wt 160.4 lb

## 2023-06-26 DIAGNOSIS — E1165 Type 2 diabetes mellitus with hyperglycemia: Secondary | ICD-10-CM

## 2023-06-26 DIAGNOSIS — H04129 Dry eye syndrome of unspecified lacrimal gland: Secondary | ICD-10-CM | POA: Diagnosis not present

## 2023-06-26 DIAGNOSIS — E559 Vitamin D deficiency, unspecified: Secondary | ICD-10-CM | POA: Diagnosis not present

## 2023-06-26 DIAGNOSIS — N62 Hypertrophy of breast: Secondary | ICD-10-CM | POA: Diagnosis not present

## 2023-06-26 DIAGNOSIS — E782 Mixed hyperlipidemia: Secondary | ICD-10-CM | POA: Diagnosis not present

## 2023-06-26 NOTE — Progress Notes (Signed)
 Established Patient Office Visit  Subjective   Patient ID: Jacqueline Padilla, female    DOB: 1975/02/08  Age: 48 y.o. MRN: [id number]  Chief Complaint  Patient presents with   Medical Management of Chronic Issues    HPI  Patient here for follow up on chronic medical conditions.   Discussed the use of AI scribe software for clinical note transcription with the patient, who gave verbal consent to proceed.  History of Present Illness   Jacqueline Padilla is a 48 year old female who presents for routine follow-up and lab work.  She is scheduled for routine lab work, including A1c, kidney, liver, electrolytes, and cholesterol levels, as well as a urine test. She is on a 0.5 mg dose of Ozempic  and experiences nausea the day after the injection. She no longer has a sour stomach.  She underwent eye surgery in the fall and experiences dry eyes, particularly upon waking, as her eyes sometimes remain open during sleep. She uses eye drops and a sleep mask to manage the dryness.  She has had a breast consultation for a reduction due to back and neck pain caused by breast size and is awaiting insurance approval.  She has consumed wine coolers, specifically Smirnoff and Seagram's, over the past few days.  HLD: -Medications: Crestor  5 mg, new since last labs -Patient is compliant with above medications and reports no side effects.  -Last lipid panel: Lipid Panel   Lipid Panel     Component Value Date/Time   CHOL 228 (H) 07/08/2022 1638   TRIG 75.0 07/08/2022 1638   HDL 47.40 07/08/2022 1638   CHOLHDL 5 07/08/2022 1638   VLDL 15.0 07/08/2022 1638   LDLCALC 166 (H) 07/08/2022 1638   LDLCALC 126 (H) 06/19/2017 1008   Diabetes, Type 2: -Last A1c 6.3% 3/25 -Medications: Metformin  increased to 750 XR mg, Ozempic  0.5 mg weekly  -Compliant with the above medications and reports no side effects.  -Eye exam: UTD  Eye 9/24 -Foot exam: UTD -Microalbumin: Due -Statin: yes -PNA  vaccine: UTD -Denies symptoms of hypoglycemia, polyuria, polydipsia, numbness extremities, foot ulcers/trauma.   Vitamin D  Deficiency:  -Finished high dose supplements, not on supplements now  Health Maintenance: -Blood work due -Mammogram 6/24 Birads-1, scheduled for 6/30 -Pap 7/21 negative   Patient Active Problem List   Diagnosis Date Noted   Type 2 diabetes mellitus with hyperglycemia, without long-term current use of insulin  (HCC) 12/26/2022   Polyp of sigmoid colon 08/26/2022   Encounter for screening colonoscopy 08/26/2022   Obesity (BMI 30-39.9) 08/07/2022   IUD (intrauterine device) in place 07/26/2019   Depression, recurrent (HCC) 06/23/2019   Abnormal uterine bleeding 04/17/2018   Insomnia 02/04/2018   Snoring 02/04/2018   GAD (generalized anxiety disorder) 07/10/2017   Iron deficiency anemia 07/10/2017   Vitamin D  deficiency 06/23/2017   Tobacco abuse 06/18/2017   Anxiety and depression 06/18/2017   Anemia 06/18/2017   STD exposure 05/30/2017   Muscle spasm 05/30/2017   Mixed hyperlipidemia 06/01/2015   Type 2 diabetes with complication (HCC) 06/01/2015   Diabetic eye exam (HCC) 03/08/2014   Hx of abnormal cervical Pap smear 05/05/2013   Past Medical History:  Diagnosis Date   Anemia    Anxiety    Asthma    Bipolar disorder, mixed (HCC)    Depression    Diabetes mellitus without complication (HCC)    Generalized anxiety disorder    Herpes    1/2 +   History of chicken pox  History of urinary tract infection    Insomnia    Iron deficiency    Vitamin D  deficiency    Past Surgical History:  Procedure Laterality Date   BELPHAROPTOSIS REPAIR Bilateral    eye lid surgery   COLONOSCOPY WITH PROPOFOL  N/A 08/26/2022   Procedure: COLONOSCOPY WITH PROPOFOL ;  Surgeon: Marnee Sink, MD;  Location: [facility];  Service: Endoscopy;  Laterality: N/A;   DILATION AND CURETTAGE OF UTERUS  01/15/1999   s/p miscarriage   ENDOMETRIAL ABLATION  01/15/2004    hx of abnormal pap   POLYPECTOMY  08/26/2022   Procedure: POLYPECTOMY;  Surgeon: Marnee Sink, MD;  Location: [facility];  Service: Endoscopy;;   TONSILLECTOMY AND ADENOIDECTOMY  01/15/1992   TUBAL LIGATION  01/15/2004   Social History   Tobacco Use   Smoking status: Former    Current packs/day: 0.25    Average packs/day: 0.2 packs/day for 18.8 years (4.7 ttl pk-yrs)    Types: Cigarettes    Start date: 07/14/2017   Smokeless tobacco: Never  Vaping Use   Vaping status: Never Used  Substance Use Topics   Alcohol use: Yes    Alcohol/week: 0.0 standard drinks of alcohol    Comment: Occassional glass of wine   Drug use: No   Social History   Socioeconomic History   Marital status: Married    Spouse name: Not on file   Number of children: 1   Years of education: 15   Highest education level: Not on file  Occupational History   Occupation: Unit Clerk    Comment: [employer]   Occupation: Advertising copywriter    Comment: [employer]  Tobacco Use   Smoking status: Former    Current packs/day: 0.25    Average packs/day: 0.2 packs/day for 18.8 years (4.7 ttl pk-yrs)    Types: Cigarettes    Start date: 07/14/2017   Smokeless tobacco: Never  Vaping Use   Vaping status: Never Used  Substance and Sexual Activity   Alcohol use: Yes    Alcohol/week: 0.0 standard drinks of alcohol    Comment: Occassional glass of wine   Drug use: No   Sexual activity: Yes    Partners: Male    Birth control/protection: Surgical, I.U.D.    Comment: Mirena /Tubal ligation  Other Topics Concern   Not on file  Social History Narrative   Jacqueline Padilla grew up in [place] , [place]. She lives in [place] with her Husband and their son. Her husband has 3 daughters that live with them as well. She has 52 y.o son as of 06/2017. 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 She enjoys spending time with the  family, outdoor activities, shopping.   Married x 16 years       Exercise - not currently   Caffeine - 1 cup daily   Social Drivers of Corporate investment banker Strain: Not on file  Food Insecurity: Not on file  Transportation Needs: Not on file  Physical Activity: Not on file  Stress: Not on file  Social Connections: Not on file  Intimate Partner Violence: Not on file   Family Status  Relation Name Status   Mother  Alive   Father  Alive   Sister  Alive   Brother  Alive   MGM  (Not Specified)   MGF  Deceased   PGM  Deceased   Son  Alive  Cousin  (Not Specified)   Other  (Not Specified)   Neg Hx  (Not Specified)  No partnership data on file   Family History  Problem Relation Age of Onset   Diabetes Mother    Hypertension Mother    Hypertension Father    Heart disease Father        CABG   Bipolar disorder Sister    Anxiety disorder Brother    Depression Brother    Stroke Maternal Grandmother    Cancer Maternal Grandfather        Lung cancer - non smoker   Breast cancer Paternal Grandmother        age unknown   Bipolar disorder Cousin    Stomach cancer Other        maternal uncle   Ovarian cancer Neg Hx    Colon cancer Neg Hx    No Known Allergies    Review of Systems  All other systems reviewed and are negative.     Objective:     BP 132/80 (Cuff Size: Large)   Pulse 93   Temp 98 F (36.7 C) (Oral)   Resp 16   Ht 5' 1 (1.549 m)   Wt 160 lb 6.4 oz (72.8 kg)   SpO2 98%   BMI 30.31 kg/m  BP Readings from Last 3 Encounters:  06/26/23 132/80  05/28/23 (!) 138/91  03/26/23 126/82   Wt Readings from Last 3 Encounters:  06/26/23 160 lb 6.4 oz (72.8 kg)  05/28/23 159 lb 9.6 oz (72.4 kg)  03/26/23 167 lb 11.2 oz (76.1 kg)      Physical Exam Constitutional:      Appearance: Normal appearance.  HENT:     Head: Normocephalic and atraumatic.   Eyes:     Conjunctiva/sclera: Conjunctivae normal.    Cardiovascular:     Rate and Rhythm:  Normal rate and regular rhythm.  Pulmonary:     Effort: Pulmonary effort is normal.     Breath sounds: Normal breath sounds.   Skin:    General: Skin is warm and dry.   Neurological:     General: No focal deficit present.     Mental Status: She is alert. Mental status is at baseline.   Psychiatric:        Mood and Affect: Mood normal.        Behavior: Behavior normal.      No results found for any visits on 06/26/23.    Last CBC Lab Results  Component Value Date   WBC 4.7 07/08/2022   HGB 13.3 07/08/2022   HCT 41.0 07/08/2022   MCV 86.6 07/08/2022   MCH 25.0 (L) 12/07/2018   RDW 13.7 07/08/2022   PLT 369.0 07/08/2022   Last metabolic panel Lab Results  Component Value Date   GLUCOSE 92 07/08/2022   NA 139 07/08/2022   K 3.9 07/08/2022   CL 103 07/08/2022   CO2 25 07/08/2022   BUN 9 07/08/2022   CREATININE 0.84 07/08/2022   GFR 83.71 07/08/2022   CALCIUM  9.9 07/08/2022   PROT 8.1 07/08/2022   ALBUMIN 4.7 07/08/2022   BILITOT 0.9 07/08/2022   ALKPHOS 47 07/08/2022   AST 22 07/08/2022   ALT 20 07/08/2022   ANIONGAP 11 12/07/2018   Last lipids Lab Results  Component Value Date   CHOL 228 (H) 07/08/2022   HDL 47.40 07/08/2022   LDLCALC 166 (H) 07/08/2022   TRIG 75.0 07/08/2022   CHOLHDL 5 07/08/2022   Last  hemoglobin A1c Lab Results  Component Value Date   HGBA1C 6.3 (A) 03/26/2023   Last thyroid  functions Lab Results  Component Value Date   TSH 1.01 07/08/2022   Last vitamin D  Lab Results  Component Value Date   VD25OH 26.52 (L) 07/08/2022   Last vitamin B12 and Folate Lab Results  Component Value Date   VITAMINB12 320 07/08/2022      The 10-year ASCVD risk score (Arnett DK, et al., 2019) is: 4.9%    Assessment & Plan:   Assessment & Plan  Type 2 Diabetes Mellitus A1c well-controlled on 0.5 mg Ozempic . Discussed potential dose increase if A1c is uncontrolled. Advised on low-sugar alcohol options. - Order A1c, kidney function,  liver function tests, and electrolytes. - Consider increasing Ozempic  if A1c is not controlled. - Advise on low-sugar alcohol options.  Hyperlipidemia On low-dose Crestor . Evaluating effectiveness with cholesterol test. - Order cholesterol test. - Consider increasing Crestor  if LDL remains high.  Dry Eye Syndrome Post-surgery dry eyes managed with drops and sleep masks. Suggested humidifier and eye mask with massage and heat. - Continue current eye drop regimen. - Consider using a humidifier during fall and winter. - Consider purchasing an eye mask with massage and heat.  Breast Hypertrophy Qualifies for breast reduction due to pain. Awaiting insurance approval. - Await insurance decision on breast reduction surgery.  Vitamin D  Deficiency No longer on supplements, recheck with labs.  General Health Maintenance Up to date with foot and eye exams. Mammogram scheduled. Checking vitamin D  levels. - Order vitamin D  level test. - Ensure mammogram is completed at the end of the month.  Follow-up Seen every three months for diabetes management. Frequency may reduce based on A1c results. - Schedule follow-up appointment in three months. - Adjust follow-up frequency based on A1c results.  - Urine Microalbumin w/creat. ratio - CBC w/Diff/Platelet - Comprehensive Metabolic Panel (CMET) - HgB A1c - Lipid Profile - Vitamin D  (25 hydroxy)   Return in about 3 months (around 09/26/2023).    Rockney Cid, DO

## 2023-06-27 ENCOUNTER — Ambulatory Visit: Payer: Self-pay | Admitting: Internal Medicine

## 2023-06-27 DIAGNOSIS — E782 Mixed hyperlipidemia: Secondary | ICD-10-CM

## 2023-06-27 LAB — LIPID PANEL
Cholesterol: 228 mg/dL — ABNORMAL HIGH (ref <200)
HDL: 48 mg/dL — ABNORMAL LOW (ref >=50)
LDL Cholesterol (Calc): 161 mg/dL — ABNORMAL HIGH
Non-HDL Cholesterol (Calc): 180 mg/dL — ABNORMAL HIGH (ref <130)
Total CHOL/HDL Ratio: 4.8 (calc) (ref <5.0)
Triglycerides: 88 mg/dL (ref <150)

## 2023-06-27 LAB — COMPREHENSIVE METABOLIC PANEL WITH GFR
AG Ratio: 1.5 (calc) (ref 1.0–2.5)
ALT: 13 U/L (ref 6–29)
AST: 20 U/L (ref 10–35)
Albumin: 4.4 g/dL (ref 3.6–5.1)
Alkaline phosphatase (APISO): 48 U/L (ref 31–125)
BUN: 10 mg/dL (ref 7–25)
CO2: 26 mmol/L (ref 20–32)
Calcium: 9.5 mg/dL (ref 8.6–10.2)
Chloride: 104 mmol/L (ref 98–110)
Creat: 0.83 mg/dL (ref 0.50–0.99)
Globulin: 3 g/dL (ref 1.9–3.7)
Glucose, Bld: 99 mg/dL (ref 65–99)
Potassium: 3.7 mmol/L (ref 3.5–5.3)
Sodium: 139 mmol/L (ref 135–146)
Total Bilirubin: 0.9 mg/dL (ref 0.2–1.2)
Total Protein: 7.4 g/dL (ref 6.1–8.1)
eGFR: 88 mL/min/{1.73_m2} (ref >=60)

## 2023-06-27 LAB — CBC WITH DIFFERENTIAL/PLATELET
Absolute Lymphocytes: 1421 {cells}/uL (ref 850–3900)
Absolute Monocytes: 211 {cells}/uL (ref 200–950)
Basophils Absolute: 62 {cells}/uL (ref 0–200)
Basophils Relative: 1.4 %
Eosinophils Absolute: 70 {cells}/uL (ref 15–500)
Eosinophils Relative: 1.6 %
HCT: 41.1 % (ref 35.0–45.0)
Hemoglobin: 13.1 g/dL (ref 11.7–15.5)
MCH: 27.1 pg (ref 27.0–33.0)
MCHC: 31.9 g/dL — ABNORMAL LOW (ref 32.0–36.0)
MCV: 84.9 fL (ref 80.0–100.0)
MPV: 9.5 fL (ref 7.5–12.5)
Monocytes Relative: 4.8 %
Neutro Abs: 2636 {cells}/uL (ref 1500–7800)
Neutrophils Relative %: 59.9 %
Platelets: 348 10*3/uL (ref 140–400)
RBC: 4.84 10*6/uL (ref 3.80–5.10)
RDW: 14.5 % (ref 11.0–15.0)
Total Lymphocyte: 32.3 %
WBC: 4.4 10*3/uL (ref 3.8–10.8)

## 2023-06-27 LAB — VITAMIN D 25 HYDROXY (VIT D DEFICIENCY, FRACTURES): Vit D, 25-Hydroxy: 30 ng/mL (ref 30–100)

## 2023-06-27 LAB — MICROALBUMIN / CREATININE URINE RATIO
Creatinine, Urine: 191 mg/dL (ref 20–275)
Microalb Creat Ratio: 10 mg/g{creat} (ref <30)
Microalb, Ur: 2 mg/dL

## 2023-06-27 LAB — HEMOGLOBIN A1C
Hgb A1c MFr Bld: 5.9 % — ABNORMAL HIGH (ref <5.7)
Mean Plasma Glucose: 123 mg/dL
eAG (mmol/L): 6.8 mmol/L

## 2023-06-27 MED ORDER — ROSUVASTATIN CALCIUM 10 MG PO TABS: 10.0000 mg | ORAL_TABLET | Freq: Every day | ORAL | 1 refills | Status: DC

## 2023-07-14 ENCOUNTER — Ambulatory Visit
Admission: RE | Admit: 2023-07-14 | Discharge: 2023-07-14 | Disposition: A | Discharge status: Home / Self Care | Source: Ambulatory Visit | Attending: Internal Medicine | Admitting: Internal Medicine

## 2023-07-14 DIAGNOSIS — Z1231 Encounter for screening mammogram for malignant neoplasm of breast: Secondary | ICD-10-CM | POA: Insufficient documentation

## 2023-07-16 ENCOUNTER — Ambulatory Visit: Payer: Self-pay | Admitting: Internal Medicine

## 2023-07-21 ENCOUNTER — Telehealth: Payer: Self-pay | Admitting: Pharmacy Technician

## 2023-07-21 ENCOUNTER — Other Ambulatory Visit (HOSPITAL_COMMUNITY): Payer: Self-pay

## 2023-07-21 NOTE — Telephone Encounter (Signed)
 Pharmacy Patient Advocate Encounter  Received notification from [facility] Medicaid that Prior Authorization for Ozempic  (0.25 or 0.5 MG/DOSE) 2MG /3ML pen-injectors has been APPROVED from 07/21/23 to 07/20/24. Ran test claim, Copay is $4.00. This test claim was processed through [facility]- copay amounts may vary at other pharmacies due to pharmacy/plan contracts, or as the patient moves through the different stages of their insurance plan.   PA #/Case ID/Reference #: [id number]

## 2023-07-21 NOTE — Telephone Encounter (Signed)
 Pharmacy Patient Advocate Encounter   Received notification from CoverMyMeds that prior authorization for Ozempic  (0.25 or 0.5 MG/DOSE) 2MG /3ML pen-injectors is required/requested.   Insurance verification completed.   The patient is insured through [facility] .   Per test claim: PA required; PA submitted to above mentioned insurance via CoverMyMeds Key/confirmation #/EOC [id number] Status is pending

## 2023-07-26 DIAGNOSIS — Z419 Encounter for procedure for purposes other than remedying health state, unspecified: Secondary | ICD-10-CM | POA: Diagnosis not present

## 2023-07-31 ENCOUNTER — Other Ambulatory Visit: Payer: Self-pay | Admitting: Surgical

## 2023-07-31 DIAGNOSIS — M546 Pain in thoracic spine: Secondary | ICD-10-CM

## 2023-07-31 DIAGNOSIS — N62 Hypertrophy of breast: Secondary | ICD-10-CM

## 2023-07-31 NOTE — Progress Notes (Signed)
 PT order for macromastia/back pain

## 2023-08-18 ENCOUNTER — Other Ambulatory Visit: Payer: Self-pay | Admitting: Internal Medicine

## 2023-08-18 DIAGNOSIS — E1165 Type 2 diabetes mellitus with hyperglycemia: Secondary | ICD-10-CM

## 2023-08-19 NOTE — Telephone Encounter (Signed)
 Requested Prescriptions  Pending Prescriptions Disp Refills   OZEMPIC , 0.25 OR 0.5 MG/DOSE, 2 MG/3ML SOPN [Pharmacy Med Name: Ozempic  (0.25 or 0.5 MG/DOSE) 2 MG/3ML Subcutaneous Solution Pen-injector] 6 mL 0    Sig: INJECT 0.5MG  SUBCUTANEOUSLY ONCE A WEEK     Endocrinology:  Diabetes - GLP-1 Receptor Agonists - semaglutide  Failed - 08/19/2023  2:01 PM      Failed - HBA1C in normal range and within 180 days    Hemoglobin A1C  Date Value Ref Range Status  05/27/2019 6.3  Final   Hgb A1c MFr Bld  Date Value Ref Range Status  06/26/2023 5.9 (H) <5.7 % Final    Comment:    For someone without known diabetes, a hemoglobin  A1c value between 5.7% and 6.4% is consistent with prediabetes and should be confirmed with a  follow-up test. . For someone with known diabetes, a value <7% indicates that their diabetes is well controlled. A1c targets should be individualized based on duration of diabetes, age, comorbid conditions, and other considerations. . This assay result is consistent with an increased risk of diabetes. . Currently, no consensus exists regarding use of hemoglobin A1c for diagnosis of diabetes for children. .          Passed - Cr in normal range and within 360 days    Creat  Date Value Ref Range Status  06/26/2023 0.83 0.50 - 0.99 mg/dL Final   Creatinine, Urine  Date Value Ref Range Status  06/26/2023 191 20 - 275 mg/dL Final         Passed - Valid encounter within last 6 months    Recent Outpatient Visits           1 month ago Type 2 diabetes mellitus with hyperglycemia, without long-term current use of insulin  [facility])   [facility] Bernardo Fend, DO   4 months ago Type 2 diabetes mellitus with hyperglycemia, without long-term current use of insulin  [facility])   [facility] Bernardo Fend, [place]

## 2023-08-26 DIAGNOSIS — Z419 Encounter for procedure for purposes other than remedying health state, unspecified: Secondary | ICD-10-CM | POA: Diagnosis not present

## 2023-09-16 ENCOUNTER — Other Ambulatory Visit (HOSPITAL_COMMUNITY): Payer: Self-pay

## 2023-09-17 ENCOUNTER — Other Ambulatory Visit (HOSPITAL_COMMUNITY): Payer: Self-pay

## 2023-09-18 ENCOUNTER — Other Ambulatory Visit (HOSPITAL_COMMUNITY): Payer: Self-pay

## 2023-09-23 ENCOUNTER — Ambulatory Visit: Attending: Surgical

## 2023-09-23 DIAGNOSIS — N62 Hypertrophy of breast: Secondary | ICD-10-CM | POA: Diagnosis not present

## 2023-09-23 DIAGNOSIS — M542 Cervicalgia: Secondary | ICD-10-CM | POA: Diagnosis not present

## 2023-09-23 DIAGNOSIS — M546 Pain in thoracic spine: Secondary | ICD-10-CM | POA: Insufficient documentation

## 2023-09-23 DIAGNOSIS — M5459 Other low back pain: Secondary | ICD-10-CM | POA: Diagnosis not present

## 2023-09-23 NOTE — Therapy (Signed)
 OUTPATIENT PHYSICAL THERAPY THORACOLUMBAR EVALUATION   Patient Name: Jacqueline Padilla MRN: [id number] DOB:09/12/1975, 48 y.o., female Today's Date: 09/23/2023  END OF SESSION:  PT End of Session - 09/23/23 0816     Visit Number 1    Number of Visits 18    Date for PT Re-Evaluation 11/21/23    PT Start Time 0817    PT Stop Time 0905    PT Time Calculation (min) 48 min    Activity Tolerance Patient tolerated treatment well    Behavior During Therapy [facility] for tasks assessed/performed          Past Medical History:  Diagnosis Date   Anemia    Anxiety    Asthma    Bipolar disorder, mixed (HCC)    Depression    Diabetes mellitus without complication (HCC)    Generalized anxiety disorder    Herpes    1/2 +   History of chicken pox    History of urinary tract infection    Insomnia    Iron deficiency    Vitamin D  deficiency    Past Surgical History:  Procedure Laterality Date   BELPHAROPTOSIS REPAIR Bilateral    eye lid surgery   COLONOSCOPY WITH PROPOFOL  N/A 08/26/2022   Procedure: COLONOSCOPY WITH PROPOFOL ;  Surgeon: Jinny Carmine, MD;  Location: [facility];  Service: Endoscopy;  Laterality: N/A;   DILATION AND CURETTAGE OF UTERUS  01/15/1999   s/p miscarriage   ENDOMETRIAL ABLATION  01/15/2004   hx of abnormal pap   POLYPECTOMY  08/26/2022   Procedure: POLYPECTOMY;  Surgeon: Jinny Carmine, MD;  Location: [facility];  Service: Endoscopy;;   TONSILLECTOMY AND ADENOIDECTOMY  01/15/1992   TUBAL LIGATION  01/15/2004   Patient Active Problem List   Diagnosis Date Noted   Type 2 diabetes mellitus with hyperglycemia, without long-term current use of insulin  (HCC) 12/26/2022   Polyp of sigmoid colon 08/26/2022   Encounter for screening colonoscopy 08/26/2022   Obesity (BMI 30-39.9) 08/07/2022   IUD (intrauterine device) in place 07/26/2019   Depression, recurrent (HCC) 06/23/2019   Abnormal uterine bleeding 04/17/2018   Insomnia 02/04/2018   Snoring  02/04/2018   GAD (generalized anxiety disorder) 07/10/2017   Iron deficiency anemia 07/10/2017   Vitamin D  deficiency 06/23/2017   Tobacco abuse 06/18/2017   Anxiety and depression 06/18/2017   Anemia 06/18/2017   STD exposure 05/30/2017   Muscle spasm 05/30/2017   Mixed hyperlipidemia 06/01/2015   Type 2 diabetes with complication (HCC) 06/01/2015   Diabetic eye exam (HCC) 03/08/2014   Hx of abnormal cervical Pap smear 05/05/2013    PCP:    Bernardo Fend, DO    REFERRING PROVIDER: Scheeler, Donnice PARAS, PA-C  REFERRING DIAG: Diagnosis N62 (ICD-10-CM) - Macromastia M54.6 (ICD-10-CM) - Bilateral thoracic back pain, unspecified chronicity  Rationale for Evaluation and Treatment: Rehabilitation  THERAPY DIAG:  Pain in thoracic spine - Plan: PT plan of care cert/re-cert  Other low back pain - Plan: PT plan of care cert/re-cert  Cervicalgia - Plan: PT plan of care cert/re-cert  ONSET DATE: 07/31/2023 (Date PT referral signed. Chronic condition)  SUBJECTIVE:  SUBJECTIVE STATEMENT: Thoracic and upper low back pain. 9/10 at most for the past 3 months.     PERTINENT HISTORY:  Thoracic and upper low back pain. Pt also has B shoulder pain around the bra strap area, B upper trap area and lower cervical spine.  Pain is chronic. Pain has been bothering her for the last 5 years. Looking into getting a breast reduction surgery around May 2026.   Pt also reports L low back pain with L L5 dermatome symptoms to ankle.   No blood pressure problems per pt.  No latex allergies  PAIN:  Are you having pain? Yes: NPRS scale: 9/10 Pain location: Thoracic and upper low back pain. Pain description: tight, tender, sore.  Aggravating factors: getting up and down from a seated position, supine, R or L S/L  position, raising her arm, donning and doffing shirts and bra, bending over to pick up items from the floor; stair negotiation at times.  Relieving factors: leaning back, seated trunk flexion, lifting her breasts up relieves back pressure.   PRECAUTIONS: None  RED FLAGS: Bowel or bladder incontinence: No and Cauda equina syndrome: No   WEIGHT BEARING RESTRICTIONS: No  FALLS:  Has patient fallen in last 6 months? 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   NEXT MD VISIT: none yet  OBJECTIVE:  Note: Objective measures were completed at Evaluation unless otherwise noted.  DIAGNOSTIC FINDINGS:    PATIENT SURVEYS:  Modified Oswestry:  MODIFIED OSWESTRY DISABILITY SCALE  Date: 09/23/2023 Score  Pain intensity 3 =  Pain medication provides me with moderate relief from pain.  2. Personal care (washing, dressing, etc.) 0 =  I can take care of myself normally without causing increased pain.  3. Lifting 1 = I can lift heavy weights, but it causes increased pain.  4. Walking 1 = Pain prevents me from walking more than 1 mile.  5. Sitting 1 =  I can only sit in my favorite chair as long as I like.  6. Standing 1 =  I can stand as long as I want but, it increases my pain.  7. Sleeping 1 = I can sleep well only by using pain medication.  8. Social Life 1 =  My social life is normal, but it increases my level of pain.  9. Traveling 1 =  I can travel anywhere, but it increases my pain.  10. Employment/ Homemaking 2 = I can perform most of my homemaking/job duties, but pain prevents me from performing more physically stressful activities (eg, lifting, vacuuming).  Total 12/50 (24%)   Interpretation of  scores: Score Category Description  0-20% Minimal Disability The patient can cope with most living activities. Usually no treatment is indicated apart from advice on lifting, sitting and exercise  21-40% Moderate Disability The patient experiences more pain and difficulty with sitting, lifting and standing. Travel and social life are more difficult and they may be disabled from work. Personal care, sexual activity and sleeping are not grossly affected, and the patient can usually be managed by conservative means  41-60% Severe Disability Pain remains the main problem in this group, but activities of daily living are affected. These patients require a detailed investigation  61-80% Crippled Back pain impinges on all aspects of  the patient's life. Positive intervention is required  81-100% Bed-bound  These patients are either bed-bound or exaggerating their symptoms  Bluford FORBES Zoe DELENA Karon DELENA, et al. Surgery versus conservative management of stable thoracolumbar fracture: the PRESTO feasibility RCT. [place]): [employer]; 2021 Nov. [facility] Technology Assessment, No. 25.62.) Appendix 3, Oswestry Disability Index category descriptors. Available from: FindJewel[employer].cz  Minimally Clinically Important Difference (MCID) = 12.8%  COGNITION: Overall cognitive status: Within functional limits for tasks assessed     SENSATION:   MUSCLE LENGTH:   POSTURE: forward neck, R shoulder slightly higher, movement crease/preference around C6/C7, as well as around C3/C4 area, slight L trunk rotation, L LE weight shift, R knee higher  Movement preference around L2/L3 area, slight R lateral shift around that area, slight R lumbar convexity around L2/3 area.   Decreased pain with manual R lateral shift correction.   PALPATION:   LUMBAR ROM:   AROM eval  Flexion WFL, with B lumbar thoracic paraspinal muscle tightness and ache  Extension WFL with lower  thoracic and upper lumbar pain. No R LE symptoms.   Right lateral flexion WFL with L low back pain  Left lateral flexion WFL with R low back pain  Right rotation Full with thoracic pain with overpressure  Left rotation Full with upper low back pain.    (Blank rows = not tested)  LOWER EXTREMITY ROM:     Passive  Right eval Left eval  Hip flexion    Hip extension    Hip abduction    Hip adduction    Hip internal rotation 25 with R hip pain 35 with R hip pain.   Hip external rotation 23 with L hip pain 45 with L hip pain  Knee flexion    Knee extension    Ankle dorsiflexion    Ankle plantarflexion    Ankle inversion    Ankle eversion     (Blank rows = not tested)  LOWER EXTREMITY MMT:    MMT Right eval Left eval  Hip flexion 4 4-  Hip extension 3- with back pain 3- with back pain  Hip abduction 4 4- with low back pain  Hip adduction    Hip internal rotation    Hip external rotation    Knee flexion 4 4+  Knee extension 5 5  Ankle dorsiflexion    Ankle plantarflexion    Ankle inversion    Ankle eversion     (Blank rows = not tested)  LUMBAR SPECIAL TESTS:  (+) repeated flexion test with R low back and R sciatic area pain to thigh.   Supine position: R LE longer Long sit position: R LE longer    FUNCTIONAL TESTS:    GAIT: Distance walked: 60 ft Assistive device utilized: None Level of assistance: Complete Independence Comments: decreased trunk rotatoin, slight decreased stance R LE, slight R thoracolumbar side bend during R LE stance phase  TREATMENT DATE: 09/23/2023  Therapeutic exercises.   Supine SKTC  R 5 seconds. Feels better per pt   Seated with lumbar towel roll   No pimprovement in pain  Standing R lateral shift correctoin   5x5 seconds   L low back discomfort.   Seated R piriformis stretch  30 seconds   Improved  exercise technique, movement at target joints, use of target muscles after mod verbal, visual, tactile cues.       PATIENT EDUCATION:  Education details: there-ex, HEP, POC Person educated: Patient Education method: Explanation, Demonstration, Tactile cues, Verbal cues, and Handouts Education comprehension: verbalized understanding and returned demonstration  HOME EXERCISE PROGRAM: Access Code: [email or web address] URL: [email or web address] Date: 09/23/2023 Prepared by: Emil Glassman  Exercises - Seated Piriformis Stretch  - 3 x daily - 7 x weekly - 1 sets - 5 reps - 30 seconds hold - Hooklying Single Knee to Chest Stretch  - 3 x daily - 7 x weekly - 3 sets - 10 reps - 10 seconds  hold  ASSESSMENT:  CLINICAL IMPRESSION: Patient is a 47 y.o. female who was seen today for physical therapy evaluation and treatment for thoracic back pain. She also demonstrates neck and low back pain, altered posture, trunk and B hip weakness, limited B hip ROM, leg length discrepancy, reproduction of symptoms with lumbar AROM, and difficulty performing tasks which involve transfers, reaching, donning and doffing clothes, picking up items from the floor and difficulty tolerating positions such as supine and R and L S/L secondary to pain. Pt will benefit from skilled physical therapy services to address the aforementioned deficits.     OBJECTIVE IMPAIRMENTS: decreased ROM, decreased strength, improper body mechanics, postural dysfunction, and pain.   ACTIVITY LIMITATIONS: lifting, bending, sleeping, stairs, and transfers  PARTICIPATION LIMITATIONS:   PERSONAL FACTORS: Fitness, Past/current experiences, Time since onset of injury/illness/exacerbation, and 3+ comorbidities: anxiety, DM, insomnia, depression are also affecting patient's functional outcome.   REHAB POTENTIAL: Fair    CLINICAL DECISION MAKING: Evolving/moderate complexity Pain is worsening based on subjective reports  EVALUATION  COMPLEXITY: Moderate   GOALS: Goals reviewed with patient? Yes  SHORT TERM GOALS: Target date: 10/03/2023  Pt will be independent with her initial HEP to decrease pain and improve strength, and function. Baseline: Goal status: INITIAL   LONG TERM GOALS: Target date: 11/21/2023  Pt will have a decrease in thoracic and upper low back pain to 3/10 or less at worst to promote ability to don and doff clothes, perform transfers, tolerate the supine and side lying positions more comfortably.  Baseline: Thoracic and upper low back pain. 9/10 at most for the past 3 months (09/23/2023) Goal status: INITIAL  2.  Pt will improve B hip strength by at least 1/2 MMT to promote ability to perform standing tasks more comfortably.  Baseline:  MMT Right eval Left eval  Hip flexion 4 4-  Hip extension 3- with back pain 3- with back pain  Hip abduction 4 4- with low back pain   Goal status: INITIAL  3.  Pt will improve her Modified Oswestry Low back Pain disability Questionnaire by at least 10% as a demonstration of improved function.  Baseline: 12/50 (24%) (09/23/2023) Goal status: INITIAL   PLAN:  PT FREQUENCY: 1-2x/week  PT DURATION: 8 weeks  PLANNED INTERVENTIONS: 97110-Therapeutic exercises, 97530- Therapeutic activity, 97112- Neuromuscular re-education, 97535- Self Care, [id number]- Manual therapy, [id number]- Electrical stimulation (unattended), [phone]- Traction (mechanical), [id number]- Ionotophoresis 4mg /ml Dexamethasone, Patient/Family education, Joint mobilization, Spinal manipulation, and Spinal mobilization.  PLAN FOR NEXT  SESSION: Posture, scapular, trunk, and hip strengthening, thoracic extension, manual techniques, modalities PRN.    Margi Edmundson, PT, DPT 09/23/2023, 12:52 PM

## 2023-09-26 ENCOUNTER — Ambulatory Visit: Admitting: Internal Medicine

## 2023-09-26 ENCOUNTER — Other Ambulatory Visit: Payer: Self-pay

## 2023-09-26 ENCOUNTER — Encounter: Payer: Self-pay | Admitting: Internal Medicine

## 2023-09-26 VITALS — BP 118/76 | HR 95 | Temp 98.8°F | Resp 16 | Ht 61.0 in | Wt 157.3 lb

## 2023-09-26 DIAGNOSIS — E782 Mixed hyperlipidemia: Secondary | ICD-10-CM

## 2023-09-26 DIAGNOSIS — M62838 Other muscle spasm: Secondary | ICD-10-CM

## 2023-09-26 DIAGNOSIS — E1165 Type 2 diabetes mellitus with hyperglycemia: Secondary | ICD-10-CM

## 2023-09-26 DIAGNOSIS — Z23 Encounter for immunization: Secondary | ICD-10-CM | POA: Diagnosis not present

## 2023-09-26 DIAGNOSIS — Z419 Encounter for procedure for purposes other than remedying health state, unspecified: Secondary | ICD-10-CM | POA: Diagnosis not present

## 2023-09-26 LAB — POCT GLYCOSYLATED HEMOGLOBIN (HGB A1C): Hemoglobin A1C: 6 % — AB (ref 4.0–5.6)

## 2023-09-26 MED ORDER — TIZANIDINE HCL 4 MG PO TABS: 4.0000 mg | ORAL_TABLET | Freq: Every evening | ORAL | 0 refills | Status: DC | PRN

## 2023-09-26 NOTE — Progress Notes (Signed)
 Established Patient Office Visit  Subjective   Patient ID: Jacqueline Padilla, female    DOB: 09-18-1975  Age: 48 y.o. MRN: [id number]  Chief Complaint  Patient presents with   Medical Management of Chronic Issues    3 month recheck    HPI  Patient here for follow up on chronic medical conditions.   Discussed the use of AI scribe software for clinical note transcription with the patient, who gave verbal consent to proceed.  History of Present Illness Jacqueline Padilla is a 48 year old female with type 2 diabetes who presents for a routine follow-up.  Her diabetes is well-controlled with an A1c of 6.0, consistent with her previous result of 5.9. She is currently on Ozempic  0.5 mg and metformin  750 mg but wants to discontinue metformin .  She experiences chronic back pain, which she attributes to her breast size. The pain sometimes causes her to feel 'stuck' when lying down. She has started physical therapy and is performing exercises at home. She previously used Flexeril  for muscle spasms and is open to trying other muscle relaxers.  She is on cholesterol medication at a dose of 10 mg, which causes her to feel achy, especially since she does not eat breakfast. She previously took a 5 mg dose, but her cholesterol remained high, prompting an increase.   HLD: -Medications: Crestor  10 mg -Patient is compliant with above medications and reports no side effects.  -Last lipid panel: Lipid Panel   Lipid Panel     Component Value Date/Time   CHOL 228 (H) 06/26/2023 1012   TRIG 88 06/26/2023 1012   HDL 48 (L) 06/26/2023 1012   CHOLHDL 4.8 06/26/2023 1012   VLDL 15.0 07/08/2022 1638   LDLCALC 161 (H) 06/26/2023 1012   Diabetes, Type 2: -Last A1c 5.9% 6/25 -Medications: Metformin  increased to 750 XR mg, Ozempic  0.5 mg weekly  -Compliant with the above medications and reports no side effects.  -Eye exam: UTD [facility] 9/24 -Foot exam: Due -Microalbumin: UTD -Statin:  yes -PNA vaccine: UTD -Denies symptoms of hypoglycemia, polyuria, polydipsia, numbness extremities, foot ulcers/trauma.   Vitamin D  Deficiency:  -Finished high dose supplements, not on supplements now  Health Maintenance: -Blood work UTD -Mammogram 6/25 Birads-1 -Pap 7/21 negative  -Flu vaccine today  Patient Active Problem List   Diagnosis Date Noted   Type 2 diabetes mellitus with hyperglycemia, without long-term current use of insulin  (HCC) 12/26/2022   Polyp of sigmoid colon 08/26/2022   Encounter for screening colonoscopy 08/26/2022   Obesity (BMI 30-39.9) 08/07/2022   IUD (intrauterine device) in place 07/26/2019   Depression, recurrent (HCC) 06/23/2019   Abnormal uterine bleeding 04/17/2018   Insomnia 02/04/2018   Snoring 02/04/2018   GAD (generalized anxiety disorder) 07/10/2017   Iron deficiency anemia 07/10/2017   Vitamin D  deficiency 06/23/2017   Tobacco abuse 06/18/2017   Anxiety and depression 06/18/2017   Anemia 06/18/2017   STD exposure 05/30/2017   Muscle spasm 05/30/2017   Mixed hyperlipidemia 06/01/2015   Type 2 diabetes with complication (HCC) 06/01/2015   Diabetic eye exam (HCC) 03/08/2014   Hx of abnormal cervical Pap smear 05/05/2013   Past Medical History:  Diagnosis Date   Anemia    Anxiety    Asthma    Bipolar disorder, mixed (HCC)    Depression    Diabetes mellitus without complication (HCC)    Generalized anxiety disorder    Herpes    1/2 +   History of chicken pox  History of urinary tract infection    Insomnia    Iron deficiency    Vitamin D  deficiency    Past Surgical History:  Procedure Laterality Date   BELPHAROPTOSIS REPAIR Bilateral    eye lid surgery   COLONOSCOPY WITH PROPOFOL  N/A 08/26/2022   Procedure: COLONOSCOPY WITH PROPOFOL ;  Surgeon: Jinny Carmine, MD;  Location: [facility];  Service: Endoscopy;  Laterality: N/A;   DILATION AND CURETTAGE OF UTERUS  01/15/1999   s/p miscarriage   ENDOMETRIAL ABLATION   01/15/2004   hx of abnormal pap   POLYPECTOMY  08/26/2022   Procedure: POLYPECTOMY;  Surgeon: Jinny Carmine, MD;  Location: [facility];  Service: Endoscopy;;   TONSILLECTOMY AND ADENOIDECTOMY  01/15/1992   TUBAL LIGATION  01/15/2004   Social History   Tobacco Use   Smoking status: Former    Current packs/day: 0.25    Average packs/day: 0.3 packs/day for 19.1 years (4.8 ttl pk-yrs)    Types: Cigarettes    Start date: 07/14/2017   Smokeless tobacco: Never  Vaping Use   Vaping status: Never Used  Substance Use Topics   Alcohol use: Yes    Alcohol/week: 0.0 standard drinks of alcohol    Comment: Occassional glass of wine   Drug use: No   Social History   Socioeconomic History   Marital status: Married    Spouse name: Not on file   Number of children: 1   Years of education: 15   Highest education level: Not on file  Occupational History   Occupation: Unit Clerk    Comment: [employer]   Occupation: Advertising copywriter    Comment: [employer]  Tobacco Use   Smoking status: Former    Current packs/day: 0.25    Average packs/day: 0.3 packs/day for 19.1 years (4.8 ttl pk-yrs)    Types: Cigarettes    Start date: 07/14/2017   Smokeless tobacco: Never  Vaping Use   Vaping status: Never Used  Substance and Sexual Activity   Alcohol use: Yes    Alcohol/week: 0.0 standard drinks of alcohol    Comment: Occassional glass of wine   Drug use: No   Sexual activity: Yes    Partners: Male    Birth control/protection: Surgical, I.U.D.    Comment: Mirena /Tubal ligation  Other Topics Concern   Not on file  Social History Narrative   Ross grew up in [place] , [place]. She lives in [place] with her Husband and their son. Her husband has 3 daughters that live with them as well. She has 28 y.o son as of 06/2017. 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 She enjoys spending time  with the family, outdoor activities, shopping.   Married x 16 years       Exercise - not currently   Caffeine - 1 cup daily   Social Drivers of Corporate investment banker Strain: Not on file  Food Insecurity: Not on file  Transportation Needs: Not on file  Physical Activity: Not on file  Stress: Not on file  Social Connections: Not on file  Intimate Partner Violence: Not on file   Family Status  Relation Name Status   Mother  Alive   Father  Alive   Sister  Alive   Brother  Alive   MGM  (Not Specified)   MGF  Deceased   PGM  Deceased   Son  Alive  Cousin  (Not Specified)   Other  (Not Specified)   Neg Hx  (Not Specified)  No partnership data on file   Family History  Problem Relation Age of Onset   Diabetes Mother    Hypertension Mother    Hypertension Father    Heart disease Father        CABG   Bipolar disorder Sister    Anxiety disorder Brother    Depression Brother    Stroke Maternal Grandmother    Cancer Maternal Grandfather        Lung cancer - non smoker   Breast cancer Paternal Grandmother        age unknown   Bipolar disorder Cousin    Stomach cancer Other        maternal uncle   Ovarian cancer Neg Hx    Colon cancer Neg Hx    No Known Allergies    Review of Systems  Musculoskeletal:  Positive for back pain.  All other systems reviewed and are negative.     Objective:     BP 118/76 (Cuff Size: Large)   Pulse 95   Temp 98.8 F (37.1 C) (Oral)   Resp 16   Ht 5' 1 (1.549 m)   Wt 157 lb 4.8 oz (71.4 kg)   SpO2 99%   BMI 29.72 kg/m  BP Readings from Last 3 Encounters:  09/26/23 118/76  06/26/23 132/80  05/28/23 (!) 138/91   Wt Readings from Last 3 Encounters:  09/26/23 157 lb 4.8 oz (71.4 kg)  06/26/23 160 lb 6.4 oz (72.8 kg)  05/28/23 159 lb 9.6 oz (72.4 kg)      Physical Exam Constitutional:      Appearance: Normal appearance.  HENT:     Head: Normocephalic and atraumatic.  Eyes:     Conjunctiva/sclera: Conjunctivae  normal.  Cardiovascular:     Rate and Rhythm: Normal rate and regular rhythm.     Pulses:          Dorsalis pedis pulses are 2+ on the right side and 2+ on the left side.  Pulmonary:     Effort: Pulmonary effort is normal.     Breath sounds: Normal breath sounds.  Musculoskeletal:     Right foot: Normal range of motion. No deformity, bunion, Charcot foot, foot drop or prominent metatarsal heads.     Left foot: Normal range of motion. No deformity, bunion, Charcot foot, foot drop or prominent metatarsal heads.  Feet:     Right foot:     Protective Sensation: 6 sites tested.  6 sites sensed.     Skin integrity: Skin integrity normal.     Toenail Condition: Right toenails are normal.     Left foot:     Protective Sensation: 6 sites tested.  6 sites sensed.     Skin integrity: Skin integrity normal.     Toenail Condition: Left toenails are normal.  Skin:    General: Skin is warm and dry.  Neurological:     General: No focal deficit present.     Mental Status: She is alert. Mental status is at baseline.  Psychiatric:        Mood and Affect: Mood normal.        Behavior: Behavior normal.      Results for orders placed or performed in visit on 09/26/23  POCT HgB A1C  Result Value Ref Range   Hemoglobin A1C 6.0 (A) 4.0 - 5.6 %   HbA1c POC (<>  result, manual entry)     HbA1c, POC (prediabetic range)     HbA1c, POC (controlled diabetic range)        Last CBC Lab Results  Component Value Date   WBC 4.4 06/26/2023   HGB 13.1 06/26/2023   HCT 41.1 06/26/2023   MCV 84.9 06/26/2023   MCH 27.1 06/26/2023   RDW 14.5 06/26/2023   PLT 348 06/26/2023   Last metabolic panel Lab Results  Component Value Date   GLUCOSE 99 06/26/2023   NA 139 06/26/2023   K 3.7 06/26/2023   CL 104 06/26/2023   CO2 26 06/26/2023   BUN 10 06/26/2023   CREATININE 0.83 06/26/2023   GFR 83.71 07/08/2022   CALCIUM  9.5 06/26/2023   PROT 7.4 06/26/2023   ALBUMIN 4.7 07/08/2022   BILITOT 0.9  06/26/2023   ALKPHOS 47 07/08/2022   AST 20 06/26/2023   ALT 13 06/26/2023   ANIONGAP 11 12/07/2018   Last lipids Lab Results  Component Value Date   CHOL 228 (H) 06/26/2023   HDL 48 (L) 06/26/2023   LDLCALC 161 (H) 06/26/2023   TRIG 88 06/26/2023   CHOLHDL 4.8 06/26/2023   Last hemoglobin A1c Lab Results  Component Value Date   HGBA1C 6.0 (A) 09/26/2023   Last thyroid  functions Lab Results  Component Value Date   TSH 1.01 07/08/2022   Last vitamin D  Lab Results  Component Value Date   VD25OH 30 06/26/2023   Last vitamin B12 and Folate Lab Results  Component Value Date   VITAMINB12 320 07/08/2022      The 10-year ASCVD risk score (Arnett DK, et al., 2019) is: 3.2%    Assessment & Plan:   Assessment & Plan Type 2 diabetes mellitus, well controlled A1c at 6.0, well controlled. Discontinued metformin , focus on Ozempic . - Discontinue metformin . - Continue Ozempic . - Schedule A1c check in 3 months.  Muscle spasm of back Chronic back pain with spasms, possibly due to large breast size. Considering breast reduction for relief. Tizanidine  preferred for spasms. - Prescribe tizanidine  (Zanaflex ) 30 tablets for use at bedtime as needed. - Continue physical therapy and home exercises.  Hyperlipidemia Managed with increased cholesterol medication at 10 mg. Reports muscle achiness, maintain current dosage. - Continue current cholesterol medication at 10 mg. - Re-evaluate cholesterol levels next summer.  - POCT HgB A1C - HM Diabetes Foot Exam - Flu vaccine trivalent PF, 6mos and older(Flulaval,Afluria,Fluarix,Fluzone) - tiZANidine  (ZANAFLEX ) 4 MG tablet; Take 1 tablet (4 mg total) by mouth at bedtime as needed.  Dispense: 30 tablet; Refill: 0   Return in about 3 months (around 12/26/2023) for follow up on a1c.    Sharyle Fischer, DO

## 2023-09-30 ENCOUNTER — Ambulatory Visit

## 2023-09-30 DIAGNOSIS — M542 Cervicalgia: Secondary | ICD-10-CM

## 2023-09-30 DIAGNOSIS — M5459 Other low back pain: Secondary | ICD-10-CM | POA: Diagnosis not present

## 2023-09-30 DIAGNOSIS — N62 Hypertrophy of breast: Secondary | ICD-10-CM | POA: Diagnosis not present

## 2023-09-30 DIAGNOSIS — M546 Pain in thoracic spine: Secondary | ICD-10-CM | POA: Diagnosis not present

## 2023-09-30 NOTE — Therapy (Signed)
 OUTPATIENT PHYSICAL THERAPY TREATMENT   Patient Name: Jacqueline Padilla MRN: [id number] DOB:10/06/1975, 48 y.o., female Today's Date: 09/30/2023  END OF SESSION:  PT End of Session - 09/30/23 1348     Visit Number 2    Number of Visits 18    Date for PT Re-Evaluation 11/21/23    PT Start Time 1348    PT Stop Time 1430    PT Time Calculation (min) 42 min    Activity Tolerance Patient tolerated treatment well    Behavior During Therapy WFL for tasks assessed/performed           Past Medical History:  Diagnosis Date   Anemia    Anxiety    Asthma    Bipolar disorder, mixed (HCC)    Depression    Diabetes mellitus without complication (HCC)    Generalized anxiety disorder    Herpes    1/2 +   History of chicken pox    History of urinary tract infection    Insomnia    Iron deficiency    Vitamin D  deficiency    Past Surgical History:  Procedure Laterality Date   BELPHAROPTOSIS REPAIR Bilateral    eye lid surgery   COLONOSCOPY WITH PROPOFOL  N/A 08/26/2022   Procedure: COLONOSCOPY WITH PROPOFOL ;  Surgeon: Jinny Carmine, MD;  Location: [facility];  Service: Endoscopy;  Laterality: N/A;   DILATION AND CURETTAGE OF UTERUS  01/15/1999   s/p miscarriage   ENDOMETRIAL ABLATION  01/15/2004   hx of abnormal pap   POLYPECTOMY  08/26/2022   Procedure: POLYPECTOMY;  Surgeon: Jinny Carmine, MD;  Location: [facility];  Service: Endoscopy;;   TONSILLECTOMY AND ADENOIDECTOMY  01/15/1992   TUBAL LIGATION  01/15/2004   Patient Active Problem List   Diagnosis Date Noted   Type 2 diabetes mellitus with hyperglycemia, without long-term current use of insulin  (HCC) 12/26/2022   Polyp of sigmoid colon 08/26/2022   Encounter for screening colonoscopy 08/26/2022   Obesity (BMI 30-39.9) 08/07/2022   IUD (intrauterine device) in place 07/26/2019   Depression, recurrent (HCC) 06/23/2019   Abnormal uterine bleeding 04/17/2018   Insomnia 02/04/2018   Snoring 02/04/2018    GAD (generalized anxiety disorder) 07/10/2017   Iron deficiency anemia 07/10/2017   Vitamin D  deficiency 06/23/2017   Tobacco abuse 06/18/2017   Anxiety and depression 06/18/2017   Anemia 06/18/2017   STD exposure 05/30/2017   Muscle spasm 05/30/2017   Mixed hyperlipidemia 06/01/2015   Type 2 diabetes with complication (HCC) 06/01/2015   Diabetic eye exam (HCC) 03/08/2014   Hx of abnormal cervical Pap smear 05/05/2013    PCP:    Bernardo Fend, DO    REFERRING PROVIDER: Scheeler, Donnice PARAS, PA-C  REFERRING DIAG: Diagnosis N62 (ICD-10-CM) - Macromastia M54.6 (ICD-10-CM) - Bilateral thoracic back pain, unspecified chronicity  Rationale for Evaluation and Treatment: Rehabilitation  THERAPY DIAG:  Pain in thoracic spine  Other low back pain  Cervicalgia  ONSET DATE: 07/31/2023 (Date PT referral signed. Chronic condition)  SUBJECTIVE:  SUBJECTIVE STATEMENT: Has back discomfort, and more L low back discomfort. The home exercise stretching seems to help.   7/10 L low back, 6/10 thoracolumbar pain currently.    PERTINENT HISTORY:  Thoracic and upper low back pain. Pt also has B shoulder pain around the bra strap area, B upper trap area and lower cervical spine.  Pain is chronic. Pain has been bothering her for the last 5 years. Looking into getting a breast reduction surgery around May 2026.   Pt also reports L low back pain with L L5 dermatome symptoms to ankle.   No blood pressure problems per pt.  No latex allergies  PAIN:  Are you having pain? Yes: NPRS scale: 9/10 Pain location: Thoracic and upper low back pain. Pain description: tight, tender, sore.  Aggravating factors: getting up and down from a seated position, supine, R or L S/L position, raising her arm, donning and  doffing shirts and bra, bending over to pick up items from the floor; stair negotiation at times.  Relieving factors: leaning back, seated trunk flexion, lifting her breasts up relieves back pressure.   PRECAUTIONS: None  RED FLAGS: Bowel or bladder incontinence: No and Cauda equina syndrome: No   WEIGHT BEARING RESTRICTIONS: No  FALLS:  Has patient fallen in last 6 months? 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   NEXT MD VISIT: none yet  OBJECTIVE:  Note: Objective measures were completed at Evaluation unless otherwise noted.  DIAGNOSTIC FINDINGS:    PATIENT SURVEYS:  Modified Oswestry:  MODIFIED OSWESTRY DISABILITY SCALE  Date: 09/23/2023 Score  Pain intensity 3 =  Pain medication provides me with moderate relief from pain.  2. Personal care (washing, dressing, etc.) 0 =  I can take care of myself normally without causing increased pain.  3. Lifting 1 = I can lift heavy weights, but it causes increased pain.  4. Walking 1 = Pain prevents me from walking more than 1 mile.  5. Sitting 1 =  I can only sit in my favorite chair as long as I like.  6. Standing 1 =  I can stand as long as I want but, it increases my pain.  7. Sleeping 1 = I can sleep well only by using pain medication.  8. Social Life 1 =  My social life is normal, but it increases my level of pain.  9. Traveling 1 =  I can travel anywhere, but it increases my pain.  10. Employment/ Homemaking 2 = I can perform most of my homemaking/job duties, but pain prevents me from performing more physically stressful activities (eg, lifting, vacuuming).  Total 12/50 (24%)   Interpretation of scores: Score Category Description   0-20% Minimal Disability The patient can cope with most living activities. Usually no treatment is indicated apart from advice on lifting, sitting and exercise  21-40% Moderate Disability The patient experiences more pain and difficulty with sitting, lifting and standing. Travel and social life are more difficult and they may be disabled from work. Personal care, sexual activity and sleeping are not grossly affected, and the patient can usually be managed by conservative means  41-60% Severe Disability Pain remains the main problem in this group, but activities of daily living are affected. These patients require a detailed  investigation  61-80% Crippled Back pain impinges on all aspects of the patient's life. Positive intervention is required  81-100% Bed-bound  These patients are either bed-bound or exaggerating their symptoms  Bluford FORBES Zoe DELENA Karon DELENA, et al. Surgery versus conservative management of stable thoracolumbar fracture: the PRESTO feasibility RCT. [place] ([place]): [employer]; 2021 Nov. [facility] Technology Assessment, No. 25.62.) Appendix 3, Oswestry Disability Index category descriptors. Available from: Find[employer]el[employer]cz  Minimally Clinically Important Difference (MCID) = 12.8%  COGNITION: Overall cognitive status: Within functional limits for tasks assessed     SENSATION:   MUSCLE LENGTH:   POSTURE: forward neck, R shoulder slightly higher, movement crease/preference around C6/C7, as well as around C3/C4 area, slight L trunk rotation, L LE weight shift, R knee higher  Movement preference around L2/L3 area, slight R lateral shift around that area, slight R lumbar convexity around L2/3 area.   Decreased pain with manual R lateral shift correction.   PALPATION:   LUMBAR ROM:   AROM eval  Flexion WFL, with B lumbar thoracic paraspinal muscle tightness and ache  Extension WFL with lower thoracic and upper lumbar pain. No R LE  symptoms.   Right lateral flexion WFL with L low back pain  Left lateral flexion WFL with R low back pain  Right rotation Full with thoracic pain with overpressure  Left rotation Full with upper low back pain.    (Blank rows = not tested)  LOWER EXTREMITY ROM:     Passive  Right eval Left eval  Hip flexion    Hip extension    Hip abduction    Hip adduction    Hip internal rotation 25 with R hip pain 35 with R hip pain.   Hip external rotation 23 with L hip pain 45 with L hip pain  Knee flexion    Knee extension    Ankle dorsiflexion    Ankle plantarflexion    Ankle inversion    Ankle eversion     (Blank rows = not tested)  LOWER EXTREMITY MMT:    MMT Right eval Left eval  Hip flexion 4 4-  Hip extension 3- with back pain 3- with back pain  Hip abduction 4 4- with low back pain  Hip adduction    Hip internal rotation    Hip external rotation    Knee flexion 4 4+  Knee extension 5 5  Ankle dorsiflexion    Ankle plantarflexion    Ankle inversion    Ankle eversion     (Blank rows = not tested)  LUMBAR SPECIAL TESTS:  (+) repeated flexion test with R low back and R sciatic area pain to thigh.   Supine position: R LE longer Long sit position: R LE longer    FUNCTIONAL TESTS:    GAIT: Distance walked: 60 ft Assistive device utilized: None Level of assistance: Complete Independence Comments: decreased trunk rotatoin, slight decreased stance R LE, slight R thoracolumbar side bend during R LE stance phase  TREATMENT DATE: 09/30/2023  Therapeutic exercises.   Supine SKTC  L 10x 5 seconds    L low back tightness  R 10x5 seconds   Supine  R piriformis stretch 30 seconds x 2  L piriformis stretch 30 seconds x 2  Bridge 2x. Increased L low back/posterior hip symptoms  Hooklying glute set 5 seconds. Increased L posterior hip  symptoms.   Hooklying  hip adduction isometrics small blue ball squeeze 10x5 seconds for 3 sets   L posterior hip/low back symptoms but eases quickly with rest.    Clamshell isometric, manually resisted 5 seconds x 2  Standing lumbar extension 10x5 seconds for 2 sets  Feels better.   Seated thoracic extension at chair 10x5 seconds for 2 sets  Standing B shoulder extension yellow band 10x2    Improved exercise technique, movement at target joints, use of target muscles after mod verbal, visual, tactile cues.       PATIENT EDUCATION:  Education details: there-ex, HEP, POC Person educated: Patient Education method: Explanation, Demonstration, Tactile cues, Verbal cues, and Handouts Education comprehension: verbalized understanding and returned demonstration  HOME EXERCISE PROGRAM: Access Code: [email or web address] URL: [email or web address] Date: 09/23/2023 Prepared by: Emil Glassman  Exercises - Seated Piriformis Stretch  - 3 x daily - 7 x weekly - 1 sets - 5 reps - 30 seconds hold - Hooklying Single Knee to Chest Stretch  - 3 x daily - 7 x weekly - 3 sets - 10 reps - 10 seconds  hold (DISCONTINUED on 09/30/2023)   - Standing Lumbar Extension  - 2 x daily - 7 x weekly - 3 sets - 10 reps - 5 seconds hold  - Shoulder extension with resistance - Neutral  - 1 x daily - 7 x weekly - 2-3 sets - 10 reps  Yellow band  ASSESSMENT:  CLINICAL IMPRESSION:  Pt demonstrates extension directional preference for L low back, posterior hip pain and thoracic pain. Decreased symptoms with lumbar extension. Increased symptoms with lumbar flexion movements.  7/10 L low back and posterior hip, 5-6/10 mid back pain after session. Pt will benefit from continued skilled physical therapy services to decrease pain, improve strength and function.      OBJECTIVE IMPAIRMENTS: decreased ROM, decreased strength, improper body mechanics, postural dysfunction, and pain.   ACTIVITY LIMITATIONS:  lifting, bending, sleeping, stairs, and transfers  PARTICIPATION LIMITATIONS:   PERSONAL FACTORS: Fitness, Past/current experiences, Time since onset of injury/illness/exacerbation, and 3+ comorbidities: anxiety, DM, insomnia, depression are also affecting patient's functional outcome.   REHAB POTENTIAL: Fair    CLINICAL DECISION MAKING: Evolving/moderate complexity Pain is worsening based on subjective reports  EVALUATION COMPLEXITY: Moderate   GOALS: Goals reviewed with patient? Yes  SHORT TERM GOALS: Target date: 10/03/2023  Pt will be independent with her initial HEP to decrease pain and improve strength, and function. Baseline: Goal status: INITIAL   LONG TERM GOALS: Target date: 11/21/2023  Pt will have a decrease in thoracic and upper low back pain to 3/10 or less at worst to promote ability to don and doff clothes, perform transfers, tolerate the supine and side lying positions more comfortably.  Baseline: Thoracic and upper low back pain. 9/10 at most for the past 3 months (09/23/2023) Goal status: INITIAL  2.  Pt will improve B hip strength by at least 1/2 MMT to promote ability to perform standing tasks more comfortably.  Baseline:  MMT Right eval Left eval  Hip flexion 4 4-  Hip extension 3- with back pain 3- with back pain  Hip abduction 4 4- with low back pain   Goal status: INITIAL  3.  Pt will improve her Modified Oswestry Low back Pain disability Questionnaire by at least 10% as a demonstration of improved function.  Baseline: 12/50 (24%) (09/23/2023) Goal status: INITIAL   PLAN:  PT FREQUENCY: 1-2x/week  PT DURATION: 8 weeks  PLANNED INTERVENTIONS: 97110-Therapeutic exercises, 97530- Therapeutic activity, 97112- Neuromuscular re-education, 97535- Self Care, 02859- Manual therapy, G0283- Electrical stimulation (unattended), [phone]- Traction (mechanical), [id number]- Ionotophoresis 4mg /ml Dexamethasone, Patient/Family education, Joint mobilization, Spinal  manipulation, and Spinal mobilization.  PLAN FOR NEXT SESSION: Posture, scapular, trunk, and hip strengthening, thoracic extension, manual techniques, modalities PRN.    Timica Marcom, PT, DPT 09/30/2023, 4:30 PM

## 2023-10-03 ENCOUNTER — Ambulatory Visit

## 2023-10-03 DIAGNOSIS — M542 Cervicalgia: Secondary | ICD-10-CM

## 2023-10-03 DIAGNOSIS — M5459 Other low back pain: Secondary | ICD-10-CM | POA: Diagnosis not present

## 2023-10-03 DIAGNOSIS — M546 Pain in thoracic spine: Secondary | ICD-10-CM | POA: Diagnosis not present

## 2023-10-03 DIAGNOSIS — N62 Hypertrophy of breast: Secondary | ICD-10-CM | POA: Diagnosis not present

## 2023-10-03 NOTE — Therapy (Signed)
 OUTPATIENT PHYSICAL THERAPY TREATMENT  Patient Name: Jacqueline Padilla MRN: [id number] DOB:08-30-1975, 48 y.o., female Today's Date: 10/03/2023  END OF SESSION:  PT End of Session - 10/03/23 0818     Visit Number 3    Number of Visits 18    Date for Recertification  11/21/23    Progress Note Due on Visit 10    PT Start Time 0816    PT Stop Time 0856    PT Time Calculation (min) 40 min    Activity Tolerance Patient tolerated treatment well;No increased pain;Patient limited by pain    Behavior During Therapy [facility] for tasks assessed/performed          Past Medical History:  Diagnosis Date   Anemia    Anxiety    Asthma    Bipolar disorder, mixed (HCC)    Depression    Diabetes mellitus without complication (HCC)    Generalized anxiety disorder    Herpes    1/2 +   History of chicken pox    History of urinary tract infection    Insomnia    Iron deficiency    Vitamin D  deficiency    Past Surgical History:  Procedure Laterality Date   BELPHAROPTOSIS REPAIR Bilateral    eye lid surgery   COLONOSCOPY WITH PROPOFOL  N/A 08/26/2022   Procedure: COLONOSCOPY WITH PROPOFOL ;  Surgeon: Jinny Carmine, MD;  Location: [facility];  Service: Endoscopy;  Laterality: N/A;   DILATION AND CURETTAGE OF UTERUS  01/15/1999   s/p miscarriage   ENDOMETRIAL ABLATION  01/15/2004   hx of abnormal pap   POLYPECTOMY  08/26/2022   Procedure: POLYPECTOMY;  Surgeon: Jinny Carmine, MD;  Location: [facility];  Service: Endoscopy;;   TONSILLECTOMY AND ADENOIDECTOMY  01/15/1992   TUBAL LIGATION  01/15/2004   Patient Active Problem List   Diagnosis Date Noted   Type 2 diabetes mellitus with hyperglycemia, without long-term current use of insulin  (HCC) 12/26/2022   Polyp of sigmoid colon 08/26/2022   Encounter for screening colonoscopy 08/26/2022   Obesity (BMI 30-39.9) 08/07/2022   IUD (intrauterine device) in place 07/26/2019   Depression, recurrent (HCC) 06/23/2019   Abnormal  uterine bleeding 04/17/2018   Insomnia 02/04/2018   Snoring 02/04/2018   GAD (generalized anxiety disorder) 07/10/2017   Iron deficiency anemia 07/10/2017   Vitamin D  deficiency 06/23/2017   Tobacco abuse 06/18/2017   Anxiety and depression 06/18/2017   Anemia 06/18/2017   STD exposure 05/30/2017   Muscle spasm 05/30/2017   Mixed hyperlipidemia 06/01/2015   Type 2 diabetes with complication (HCC) 06/01/2015   Diabetic eye exam (HCC) 03/08/2014   Hx of abnormal cervical Pap smear 05/05/2013    PCP:    Bernardo Fend, DO   REFERRING PROVIDER: Scheeler, Donnice PARAS, PA-C  REFERRING DIAG: Diagnosis N62 (ICD-10-CM) - Macromastia M54.6 (ICD-10-CM) - Bilateral thoracic back pain, unspecified chronicity  Rationale for Evaluation and Treatment: Rehabilitation  THERAPY DIAG:  Pain in thoracic spine  Other low back pain  Cervicalgia  ONSET DATE: 07/31/2023 (Date PT referral signed. Chronic condition)  SUBJECTIVE:  SUBJECTIVE STATEMENT: Sore from exercises and about the same degree of pain in left side.    PERTINENT HISTORY:  Thoracic and upper low back pain. Pt also has B shoulder pain around the bra strap area, B upper trap area and lower cervical spine.  Pain is chronic. Pain has been bothering her for the last 5 years. Looking into getting a breast reduction surgery around May 2026.   Pt also reports L low back pain with L L5 dermatome symptoms to ankle.   PAIN:  Are you having pain? 6/10 left hip, mid thoracic spine 5-6/10  PRECAUTIONS: None   WEIGHT BEARING RESTRICTIONS: No  FALLS:  Has patient fallen in last 6 months? 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   OBJECTIVE:  Note: Objective measures were completed at evaluation unless otherwise noted.  TREATMENT DATE 10/03/23                                                                                                                       -standing repeat extension 10x5secH  -seated thoracic extension over chair 10x5secH  -standing repeat extension 10x5secH  -seated thoracic extension over chair 10x5secH   -seated FABER piriformis stretch 1x30sec bilat  -seated blue yoga ball adductor squeeze 10x5secH  -seated blue mobilization belt isometric clamshell 10x5secH   -seated FABER prirformis stretch 1x30sec bilat  -seated blue yoga ball adductor squeeze 10x5secH  -seated blue mobilization belt isometric clamshell 10x5secH  -standing shoulder extension c YTB 15x -silver physioball overhead Y x15 (cues to max vertical height) *excellent scapular depression  -standing shoulder extension c YTB 15x -silver physioball overhead Y x15 (cues to max vertical height)   PATIENT EDUCATION:  Education details: there-ex, HEP, POC Person educated: Patient Education method: Explanation, Demonstration, Tactile cues, Verbal cues, and Handouts Education comprehension: verbalized understanding and returned demonstration  HOME EXERCISE PROGRAM: Access Code: [email or web address] URL: [email or web address] Date: 09/23/2023 Prepared by: Emil Glassman  Exercises - Seated Piriformis Stretch  - 3 x daily - 7 x weekly - 1 sets - 5 reps - 30 seconds hold - Hooklying Single Knee to Chest Stretch  - 3 x daily - 7 x weekly - 3 sets - 10 reps - 10 seconds  hold (DISCONTINUED on 09/30/2023) - Standing Lumbar Extension  - 2 x daily - 7 x weekly - 3 sets - 10 reps - 5 seconds hold - Shoulder extension with resistance - Neutral  - 1 x  daily - 7 x weekly - 2-3 sets - 10 reps  Yellow band  ASSESSMENT:  CLINICAL IMPRESSION: Continued to work with extension directional preference and postural retraining. Continued conversation of best performance tips for HEP and symptoms management at home. Excellent scapular excursion and postural education with exercises today. Pt will  benefit from continued skilled physical therapy services to decrease pain, improve strength and function.   OBJECTIVE IMPAIRMENTS: decreased ROM, decreased strength, improper body mechanics, postural dysfunction, and pain.   ACTIVITY LIMITATIONS: lifting, bending, sleeping, stairs, and transfers  PARTICIPATION LIMITATIONS:   PERSONAL FACTORS: Fitness, Past/current experiences, Time since onset of injury/illness/exacerbation, and 3+ comorbidities: anxiety, DM, insomnia, depression are also affecting patient's functional outcome.   REHAB POTENTIAL: Fair   CLINICAL DECISION MAKING: Evolving/moderate complexity Pain is worsening based on subjective reports EVALUATION COMPLEXITY: Moderate  GOALS: Goals reviewed with patient? Yes  SHORT TERM GOALS: Target date: 10/03/2023  Pt will be independent with her initial HEP to decrease pain and improve strength, and function. Baseline: Goal status: INITIAL  LONG TERM GOALS: Target date: 11/21/2023  Pt will have a decrease in thoracic and upper low back pain to 3/10 or less at worst to promote ability to don and doff clothes, perform transfers, tolerate the supine and side lying positions more comfortably.  Baseline: Thoracic and upper low back pain. 9/10 at most for the past 3 months (09/23/2023) Goal status: INITIAL  2.  Pt will improve B hip strength by at least 1/2 MMT to promote ability to perform standing tasks more comfortably.  Baseline:  MMT Right eval Left eval  Hip flexion 4 4-  Hip extension 3- with back pain 3- with back pain  Hip abduction 4 4- with low back pain   Goal status: INITIAL  3.   Pt will improve her Modified Oswestry Low back Pain disability Questionnaire by at least 10% as a demonstration of improved function.  Baseline: 12/50 (24%) (09/23/2023) Goal status: INITIAL  PLAN:  PT FREQUENCY: 1-2x/week  PT DURATION: 8 weeks  PLANNED INTERVENTIONS: 97110-Therapeutic exercises, 97530- Therapeutic activity, 97112- Neuromuscular re-education, 97535- Self Care, 02859- Manual therapy, G0283- Electrical stimulation (unattended), 02987- Traction (mechanical), [id number]- Ionotophoresis 4mg /ml Dexamethasone, Patient/Family education, Joint mobilization, Spinal manipulation, and Spinal mobilization.  PLAN FOR NEXT SESSION: Posture, scapular, trunk, and hip strengthening, thoracic extension, manual techniques, modalities PRN.   Margo Lama C, PT, DPT 10/03/2023, 8:20 AM  8:20 AM, 10/03/23 Peggye JAYSON Linear, PT, DPT Physical Therapist - [place] [phone] (Office)

## 2023-10-07 ENCOUNTER — Ambulatory Visit

## 2023-10-07 DIAGNOSIS — M546 Pain in thoracic spine: Secondary | ICD-10-CM

## 2023-10-07 DIAGNOSIS — M542 Cervicalgia: Secondary | ICD-10-CM

## 2023-10-07 DIAGNOSIS — N62 Hypertrophy of breast: Secondary | ICD-10-CM | POA: Diagnosis not present

## 2023-10-07 DIAGNOSIS — M5459 Other low back pain: Secondary | ICD-10-CM | POA: Diagnosis not present

## 2023-10-07 NOTE — Therapy (Signed)
 OUTPATIENT PHYSICAL THERAPY TREATMENT   Patient Name: Jacqueline Padilla MRN: [id number] DOB:01-08-1976, 48 y.o., female Today's Date: 10/07/2023  END OF SESSION:  PT End of Session - 10/07/23 1350     Visit Number 4    Number of Visits 18    Date for Recertification  11/21/23    Progress Note Due on Visit 10    PT Start Time 1351    PT Stop Time 1430    PT Time Calculation (min) 39 min    Activity Tolerance Patient tolerated treatment well;No increased pain;Patient limited by pain    Behavior During Therapy [facility] for tasks assessed/performed            Past Medical History:  Diagnosis Date   Anemia    Anxiety    Asthma    Bipolar disorder, mixed (HCC)    Depression    Diabetes mellitus without complication (HCC)    Generalized anxiety disorder    Herpes    1/2 +   History of chicken pox    History of urinary tract infection    Insomnia    Iron deficiency    Vitamin D  deficiency    Past Surgical History:  Procedure Laterality Date   BELPHAROPTOSIS REPAIR Bilateral    eye lid surgery   COLONOSCOPY WITH PROPOFOL  N/A 08/26/2022   Procedure: COLONOSCOPY WITH PROPOFOL ;  Surgeon: Jinny Carmine, MD;  Location: [facility];  Service: Endoscopy;  Laterality: N/A;   DILATION AND CURETTAGE OF UTERUS  01/15/1999   s/p miscarriage   ENDOMETRIAL ABLATION  01/15/2004   hx of abnormal pap   POLYPECTOMY  08/26/2022   Procedure: POLYPECTOMY;  Surgeon: Jinny Carmine, MD;  Location: [facility];  Service: Endoscopy;;   TONSILLECTOMY AND ADENOIDECTOMY  01/15/1992   TUBAL LIGATION  01/15/2004   Patient Active Problem List   Diagnosis Date Noted   Type 2 diabetes mellitus with hyperglycemia, without long-term current use of insulin  (HCC) 12/26/2022   Polyp of sigmoid colon 08/26/2022   Encounter for screening colonoscopy 08/26/2022   Obesity (BMI 30-39.9) 08/07/2022   IUD (intrauterine device) in place 07/26/2019   Depression, recurrent 06/23/2019   Abnormal  uterine bleeding 04/17/2018   Insomnia 02/04/2018   Snoring 02/04/2018   GAD (generalized anxiety disorder) 07/10/2017   Iron deficiency anemia 07/10/2017   Vitamin D  deficiency 06/23/2017   Tobacco abuse 06/18/2017   Anxiety and depression 06/18/2017   Anemia 06/18/2017   STD exposure 05/30/2017   Muscle spasm 05/30/2017   Mixed hyperlipidemia 06/01/2015   Type 2 diabetes with complication (HCC) 06/01/2015   Diabetic eye exam (HCC) 03/08/2014   Hx of abnormal cervical Pap smear 05/05/2013    PCP:    Bernardo Fend, DO    REFERRING PROVIDER: Scheeler, Donnice PARAS, PA-C  REFERRING DIAG: Diagnosis N62 (ICD-10-CM) - Macromastia M54.6 (ICD-10-CM) - Bilateral thoracic back pain, unspecified chronicity  Rationale for Evaluation and Treatment: Rehabilitation  THERAPY DIAG:  Pain in thoracic spine  Other low back pain  Cervicalgia  ONSET DATE: 07/31/2023 (Date PT referral signed. Chronic condition)  SUBJECTIVE:  SUBJECTIVE STATEMENT: Feeling sore, might be over doing it. 6/10 L low back, 6/10 thoracolumbar pain currently. Twisting and seated trunk flexion stretch helps at times. Feels like something needs to pop.      PERTINENT HISTORY:  Thoracic and upper low back pain. Pt also has B shoulder pain around the bra strap area, B upper trap area and lower cervical spine.  Pain is chronic. Pain has been bothering her for the last 5 years. Looking into getting a breast reduction surgery around May 2026.   Pt also reports L low back pain with L L5 dermatome symptoms to ankle.   No blood pressure problems per pt.  No latex allergies  PAIN:  Are you having pain? Yes: NPRS scale: 9/10 Pain location: Thoracic and upper low back pain. Pain description: tight, tender, sore.  Aggravating factors:  getting up and down from a seated position, supine, R or L S/L position, raising her arm, donning and doffing shirts and bra, bending over to pick up items from the floor; stair negotiation at times.  Relieving factors: leaning back, seated trunk flexion, lifting her breasts up relieves back pressure.   PRECAUTIONS: None  RED FLAGS: Bowel or bladder incontinence: No and Cauda equina syndrome: No   WEIGHT BEARING RESTRICTIONS: No  FALLS:  Has patient fallen in last 6 months? 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   NEXT MD VISIT: none yet  OBJECTIVE:  Note: Objective measures were completed at Evaluation unless otherwise noted.  DIAGNOSTIC FINDINGS:    PATIENT SURVEYS:  Modified Oswestry:  MODIFIED OSWESTRY DISABILITY SCALE  Date: 09/23/2023 Score  Pain intensity 3 =  Pain medication provides me with moderate relief from pain.  2. Personal care (washing, dressing, etc.) 0 =  I can take care of myself normally without causing increased pain.  3. Lifting 1 = I can lift heavy weights, but it causes increased pain.  4. Walking 1 = Pain prevents me from walking more than 1 mile.  5. Sitting 1 =  I can only sit in my favorite chair as long as I like.  6. Standing 1 =  I can stand as long as I want but, it increases my pain.  7. Sleeping 1 = I can sleep well only by using pain medication.  8. Social Life 1 =  My social life is normal, but it increases my level of pain.  9. Traveling 1 =  I can travel anywhere, but it increases my pain.  10. Employment/ Homemaking 2 = I can perform most of my homemaking/job duties, but pain prevents me from performing more physically stressful activities (eg,  lifting, vacuuming).  Total 12/50 (24%)   Interpretation of scores: Score Category Description  0-20% Minimal Disability The patient can cope with most living activities. Usually no treatment is indicated apart from advice on lifting, sitting and exercise  21-40% Moderate Disability The patient experiences more pain and difficulty with sitting, lifting and standing. Travel and social life are more difficult and they may be disabled from work. Personal care, sexual activity and sleeping are not grossly affected, and the patient can usually be managed by conservative means  41-60% Severe Disability Pain remains the main problem in this group, but activities of daily living are  affected. These patients require a detailed investigation  61-80% Crippled Back pain impinges on all aspects of the patient's life. Positive intervention is required  81-100% Bed-bound  These patients are either bed-bound or exaggerating their symptoms  Bluford FORBES Zoe DELENA Karon DELENA, et al. Surgery versus conservative management of stable thoracolumbar fracture: the PRESTO feasibility RCT. [place] ([place]): [employer]; 2021 Nov. [facility] Technology Assessment, No. 25.62.) Appendix 3, Oswestry Disability Index category descriptors. Available from: Find[employer]el[employer].cz  Minimally Clinically Important Difference (MCID) = 12.8%  COGNITION: Overall cognitive status: Within functional limits for tasks assessed     SENSATION:   MUSCLE LENGTH:   POSTURE: forward neck, R shoulder slightly higher, movement crease/preference around C6/C7, as well as around C3/C4 area, slight L trunk rotation, L LE weight shift, R knee higher  Movement preference around L2/L3 area, slight R lateral shift around that area, slight R lumbar convexity around L2/3 area.   Decreased pain with manual R lateral shift correction.   PALPATION:   LUMBAR ROM:   AROM eval  Flexion WFL, with B lumbar thoracic  paraspinal muscle tightness and ache  Extension WFL with lower thoracic and upper lumbar pain. No R LE symptoms.   Right lateral flexion WFL with L low back pain  Left lateral flexion WFL with R low back pain  Right rotation Full with thoracic pain with overpressure  Left rotation Full with upper low back pain.    (Blank rows = not tested)  LOWER EXTREMITY ROM:     Passive  Right eval Left eval  Hip flexion    Hip extension    Hip abduction    Hip adduction    Hip internal rotation 25 with R hip pain 35 with R hip pain.   Hip external rotation 23 with L hip pain 45 with L hip pain  Knee flexion    Knee extension    Ankle dorsiflexion    Ankle plantarflexion    Ankle inversion    Ankle eversion     (Blank rows = not tested)  LOWER EXTREMITY MMT:    MMT Right eval Left eval  Hip flexion 4 4-  Hip extension 3- with back pain 3- with back pain  Hip abduction 4 4- with low back pain  Hip adduction    Hip internal rotation    Hip external rotation    Knee flexion 4 4+  Knee extension 5 5  Ankle dorsiflexion    Ankle plantarflexion    Ankle inversion    Ankle eversion     (Blank rows = not tested)  LUMBAR SPECIAL TESTS:  (+) repeated flexion test with R low back and R sciatic area pain to thigh.   Supine position: R LE longer Long sit position: R LE longer    FUNCTIONAL TESTS:    GAIT: Distance walked: 60 ft Assistive device utilized: None Level of assistance: Complete Independence Comments: decreased trunk rotatoin, slight decreased stance R LE, slight R thoracolumbar side bend during R LE stance phase  TREATMENT DATE: 10/07/2023  Therapeutic exercises.   Seated thoracic extension over chair 9x5 seconds. Thoracic discomfort, slowly eases with rest.   Seated tranversus abdminis contraction 7x5 seconds   L low back  symptoms  Standing lumbar extension 10x5 seconds   Feels better  Prone on elbows 10 seconds. Uncomfortable  At [facility] machine Seated rows plate 10 for [id number]  Reclined  Hooklying    Transversus abominis contraction 8x5 seconds    L low back pressure, eases when in supine and not reclined  Hooklying hip extension isometrics, leg straight  L 10x5 seconds fro 2 sets  R 10x5 seconds   Improved exercise technique, movement at target joints, use of target muscles after mod verbal, visual, tactile cues.      Manual therapy  Prone  L UPA to L5 TP grade 1-3 Prone R UPA to L5 TP grade 3-  Performed to promote blood flow and movement.   Increased low back discomfort after manual therapy.     PATIENT EDUCATION:  Education details: there-ex, HEP, POC Person educated: Patient Education method: Explanation, Demonstration, Tactile cues, Verbal cues, and Handouts Education comprehension: verbalized understanding and returned demonstration  HOME EXERCISE PROGRAM: Access Code: [email or web address] URL: [email or web address] Date: 09/23/2023 Prepared by: Emil Glassman  Exercises - Seated Piriformis Stretch  - 3 x daily - 7 x weekly - 1 sets - 5 reps - 30 seconds hold - Hooklying Single Knee to Chest Stretch  - 3 x daily - 7 x weekly - 3 sets - 10 reps - 10 seconds  hold (DISCONTINUED on 09/30/2023)   - Standing Lumbar Extension  - 2 x daily - 7 x weekly - 3 sets - 10 reps - 5 seconds hold  - Shoulder extension with resistance - Neutral  - 1 x daily - 7 x weekly - 2-3 sets - 10 reps  Yellow band  ASSESSMENT:  CLINICAL IMPRESSION: Decreased symptoms with standing lumbar extension. Increased symptoms with thoracic extension. Low back symptoms were not improved with manual therapy to L5 area. Most comfortable positions for low back seem to be standing or supine. Fair tolerance to today's session.  Pt will benefit from continued skilled physical therapy services to decrease pain,  improve strength and function.      OBJECTIVE IMPAIRMENTS: decreased ROM, decreased strength, improper body mechanics, postural dysfunction, and pain.   ACTIVITY LIMITATIONS: lifting, bending, sleeping, stairs, and transfers  PARTICIPATION LIMITATIONS:   PERSONAL FACTORS: Fitness, Past/current experiences, Time since onset of injury/illness/exacerbation, and 3+ comorbidities: anxiety, DM, insomnia, depression are also affecting patient's functional outcome.   REHAB POTENTIAL: Fair    CLINICAL DECISION MAKING: Evolving/moderate complexity Pain is worsening based on subjective reports  EVALUATION COMPLEXITY: Moderate   GOALS: Goals reviewed with patient? Yes  SHORT TERM GOALS: Target date: 10/03/2023  Pt will be independent with her initial HEP to decrease pain and improve strength, and function. Baseline: Goal status: INITIAL   LONG TERM GOALS: Target date: 11/21/2023  Pt will have a decrease in thoracic and upper low back pain to 3/10 or less at worst to promote ability to don and doff clothes, perform transfers, tolerate the supine and side lying positions more comfortably.  Baseline: Thoracic and upper low back pain. 9/10 at most for the past 3 months (09/23/2023) Goal status: INITIAL  2.  Pt will improve B hip strength by at least 1/2 MMT to promote ability to perform standing tasks more comfortably.  Baseline:  MMT Right eval Left eval  Hip flexion 4 4-  Hip  extension 3- with back pain 3- with back pain  Hip abduction 4 4- with low back pain   Goal status: INITIAL  3.  Pt will improve her Modified Oswestry Low back Pain disability Questionnaire by at least 10% as a demonstration of improved function.  Baseline: 12/50 (24%) (09/23/2023) Goal status: INITIAL   PLAN:  PT FREQUENCY: 1-2x/week  PT DURATION: 8 weeks  PLANNED INTERVENTIONS: 97110-Therapeutic exercises, 97530- Therapeutic activity, 97112- Neuromuscular re-education, 97535- Self Care, [id number]- Manual  therapy, G0283- Electrical stimulation (unattended), [phone]- Traction (mechanical), [id number]- Ionotophoresis 4mg /ml Dexamethasone, Patient/Family education, Joint mobilization, Spinal manipulation, and Spinal mobilization.  PLAN FOR NEXT SESSION: Posture, scapular, trunk, and hip strengthening, thoracic extension, manual techniques, modalities PRN.    Alvia Jablonski, PT, DPT 10/07/2023, 4:26 PM

## 2023-10-09 ENCOUNTER — Ambulatory Visit

## 2023-10-10 ENCOUNTER — Other Ambulatory Visit: Payer: Self-pay | Admitting: Internal Medicine

## 2023-10-10 DIAGNOSIS — E1165 Type 2 diabetes mellitus with hyperglycemia: Secondary | ICD-10-CM

## 2023-10-10 NOTE — Telephone Encounter (Signed)
 Requested Prescriptions  Pending Prescriptions Disp Refills   OZEMPIC , 0.25 OR 0.5 MG/DOSE, 2 MG/3ML SOPN [Pharmacy Med Name: Ozempic  (0.25 or 0.5 MG/DOSE) 2 MG/3ML Subcutaneous Solution Pen-injector] 6 mL 0    Sig: INJECT 0.5MG  SUBCUTANEOUSLY ONCE A WEEK     Endocrinology:  Diabetes - GLP-1 Receptor Agonists - semaglutide  Failed - 10/10/2023  4:24 PM      Failed - HBA1C in normal range and within 180 days    Hemoglobin A1C  Date Value Ref Range Status  09/26/2023 6.0 (A) 4.0 - 5.6 % Final  05/27/2019 6.3  Final   Hgb A1c MFr Bld  Date Value Ref Range Status  06/26/2023 5.9 (H) <5.7 % Final    Comment:    For someone without known diabetes, a hemoglobin  A1c value between 5.7% and 6.4% is consistent with prediabetes and should be confirmed with a  follow-up test. . For someone with known diabetes, a value <7% indicates that their diabetes is well controlled. A1c targets should be individualized based on duration of diabetes, age, comorbid conditions, and other considerations. . This assay result is consistent with an increased risk of diabetes. . Currently, no consensus exists regarding use of hemoglobin A1c for diagnosis of diabetes for children. .          Passed - Cr in normal range and within 360 days    Creat  Date Value Ref Range Status  06/26/2023 0.83 0.50 - 0.99 mg/dL Final   Creatinine, Urine  Date Value Ref Range Status  06/26/2023 191 20 - 275 mg/dL Final         Passed - Valid encounter within last 6 months    Recent Outpatient Visits           2 weeks ago Type 2 diabetes mellitus with hyperglycemia, without long-term current use of insulin  [facility])   [facility] Bernardo Fend, DO   3 months ago Type 2 diabetes mellitus with hyperglycemia, without long-term current use of insulin  [facility])   [place] [facility] Bernardo Fend, DO   6 months ago Type 2 diabetes mellitus with hyperglycemia, without  long-term current use of insulin  [facility])   [facility] Bernardo Fend, DO       Future Appointments             In 2 months Bernardo Fend, DO [facility] [id number]

## 2023-10-14 ENCOUNTER — Ambulatory Visit

## 2023-10-14 DIAGNOSIS — M546 Pain in thoracic spine: Secondary | ICD-10-CM | POA: Diagnosis not present

## 2023-10-14 DIAGNOSIS — M542 Cervicalgia: Secondary | ICD-10-CM

## 2023-10-14 DIAGNOSIS — M5459 Other low back pain: Secondary | ICD-10-CM | POA: Diagnosis not present

## 2023-10-14 DIAGNOSIS — N62 Hypertrophy of breast: Secondary | ICD-10-CM | POA: Diagnosis not present

## 2023-10-14 NOTE — Therapy (Signed)
 OUTPATIENT PHYSICAL THERAPY TREATMENT   Patient Name: Jacqueline Padilla MRN: [id number] DOB:23-Dec-1975, 48 y.o., female Today's Date: 10/14/2023  END OF SESSION:  PT End of Session - 10/14/23 1301     Visit Number 5    Number of Visits 18    Date for Recertification  11/21/23    Progress Note Due on Visit 10    PT Start Time 1301    PT Stop Time 1349    PT Time Calculation (min) 48 min    Activity Tolerance Patient tolerated treatment well;No increased pain;Patient limited by pain    Behavior During Therapy [facility] for tasks assessed/performed             Past Medical History:  Diagnosis Date   Anemia    Anxiety    Asthma    Bipolar disorder, mixed (HCC)    Depression    Diabetes mellitus without complication (HCC)    Generalized anxiety disorder    Herpes    1/2 +   History of chicken pox    History of urinary tract infection    Insomnia    Iron deficiency    Vitamin D  deficiency    Past Surgical History:  Procedure Laterality Date   BELPHAROPTOSIS REPAIR Bilateral    eye lid surgery   COLONOSCOPY WITH PROPOFOL  N/A 08/26/2022   Procedure: COLONOSCOPY WITH PROPOFOL ;  Surgeon: Jinny Carmine, MD;  Location: [facility];  Service: Endoscopy;  Laterality: N/A;   DILATION AND CURETTAGE OF UTERUS  01/15/1999   s/p miscarriage   ENDOMETRIAL ABLATION  01/15/2004   hx of abnormal pap   POLYPECTOMY  08/26/2022   Procedure: POLYPECTOMY;  Surgeon: Jinny Carmine, MD;  Location: [facility];  Service: Endoscopy;;   TONSILLECTOMY AND ADENOIDECTOMY  01/15/1992   TUBAL LIGATION  01/15/2004   Patient Active Problem List   Diagnosis Date Noted   Type 2 diabetes mellitus with hyperglycemia, without long-term current use of insulin  (HCC) 12/26/2022   Polyp of sigmoid colon 08/26/2022   Encounter for screening colonoscopy 08/26/2022   Obesity (BMI 30-39.9) 08/07/2022   IUD (intrauterine device) in place 07/26/2019   Depression, recurrent 06/23/2019   Abnormal  uterine bleeding 04/17/2018   Insomnia 02/04/2018   Snoring 02/04/2018   GAD (generalized anxiety disorder) 07/10/2017   Iron deficiency anemia 07/10/2017   Vitamin D  deficiency 06/23/2017   Tobacco abuse 06/18/2017   Anxiety and depression 06/18/2017   Anemia 06/18/2017   STD exposure 05/30/2017   Muscle spasm 05/30/2017   Mixed hyperlipidemia 06/01/2015   Type 2 diabetes with complication (HCC) 06/01/2015   Diabetic eye exam (HCC) 03/08/2014   Hx of abnormal cervical Pap smear 05/05/2013    PCP:    Bernardo Fend, DO    REFERRING PROVIDER: Scheeler, Donnice PARAS, PA-C  REFERRING DIAG: Diagnosis N62 (ICD-10-CM) - Macromastia M54.6 (ICD-10-CM) - Bilateral thoracic back pain, unspecified chronicity  Rationale for Evaluation and Treatment: Rehabilitation  THERAPY DIAG:  Pain in thoracic spine  Other low back pain  Cervicalgia  ONSET DATE: 07/31/2023 (Date PT referral signed. Chronic condition)  SUBJECTIVE:  SUBJECTIVE STATEMENT: Low back is a 6.5/10 currently. Mid back is about a 5/10 currently. Was ok after last session.         PERTINENT HISTORY:  Thoracic and upper low back pain. Pt also has B shoulder pain around the bra strap area, B upper trap area and lower cervical spine.  Pain is chronic. Pain has been bothering her for the last 5 years. Looking into getting a breast reduction surgery around May 2026.   Pt also reports L low back pain with L L5 dermatome symptoms to ankle.   No blood pressure problems per pt.  No latex allergies  PAIN:  Are you having pain? Yes: NPRS scale: 9/10 Pain location: Thoracic and upper low back pain. Pain description: tight, tender, sore.  Aggravating factors: getting up and down from a seated position, supine, R or L S/L position, raising  her arm, donning and doffing shirts and bra, bending over to pick up items from the floor; stair negotiation at times.  Relieving factors: leaning back, seated trunk flexion, lifting her breasts up relieves back pressure.   PRECAUTIONS: None  RED FLAGS: Bowel or bladder incontinence: No and Cauda equina syndrome: No   WEIGHT BEARING RESTRICTIONS: No  FALLS:  Has patient fallen in last 6 months? 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   NEXT MD VISIT: none yet  OBJECTIVE:  Note: Objective measures were completed at Evaluation unless otherwise noted.  DIAGNOSTIC FINDINGS:    PATIENT SURVEYS:  Modified Oswestry:  MODIFIED OSWESTRY DISABILITY SCALE  Date: 09/23/2023 Score  Pain intensity 3 =  Pain medication provides me with moderate relief from pain.  2. Personal care (washing, dressing, etc.) 0 =  I can take care of myself normally without causing increased pain.  3. Lifting 1 = I can lift heavy weights, but it causes increased pain.  4. Walking 1 = Pain prevents me from walking more than 1 mile.  5. Sitting 1 =  I can only sit in my favorite chair as long as I like.  6. Standing 1 =  I can stand as long as I want but, it increases my pain.  7. Sleeping 1 = I can sleep well only by using pain medication.  8. Social Life 1 =  My social life is normal, but it increases my level of pain.  9. Traveling 1 =  I can travel anywhere, but it increases my pain.  10. Employment/ Homemaking 2 = I can perform most of my homemaking/job duties, but pain prevents me from performing more physically stressful activities (eg, lifting, vacuuming).  Total 12/50 (24%)   Interpretation of scores: Score  Category Description  0-20% Minimal Disability The patient can cope with most living activities. Usually no treatment is indicated apart from advice on lifting, sitting and exercise  21-40% Moderate Disability The patient experiences more pain and difficulty with sitting, lifting and standing. Travel and social life are more difficult and they may be disabled from work. Personal care, sexual activity and sleeping are not grossly affected, and the patient can usually be managed by conservative means  41-60% Severe Disability Pain remains the main problem in this group, but activities of daily living are affected. These patients require a detailed investigation  61-80%  Crippled Back pain impinges on all aspects of the patient's life. Positive intervention is required  81-100% Bed-bound  These patients are either bed-bound or exaggerating their symptoms  Bluford FORBES Zoe DELENA Karon DELENA, et al. Surgery versus conservative management of stable thoracolumbar fracture: the PRESTO feasibility RCT. [place] ([place]): [employer]; 2021 Nov. [facility] Technology Assessment, No. 25.62.) Appendix 3, Oswestry Disability Index category descriptors. Available from: [employer]el[employer]cz  Minimally Clinically Important Difference (MCID) = 12.8%  COGNITION: Overall cognitive status: Within functional limits for tasks assessed     SENSATION:   MUSCLE LENGTH:   POSTURE: forward neck, R shoulder slightly higher, movement crease/preference around C6/C7, as well as around C3/C4 area, slight L trunk rotation, L LE weight shift, R knee higher  Movement preference around L2/L3 area, slight R lateral shift around that area, slight R lumbar convexity around L2/3 area.   Decreased pain with manual R lateral shift correction.   PALPATION:   LUMBAR ROM:   AROM eval  Flexion WFL, with B lumbar thoracic paraspinal muscle tightness and ache  Extension WFL with lower thoracic and upper  lumbar pain. No R LE symptoms.   Right lateral flexion WFL with L low back pain  Left lateral flexion WFL with R low back pain  Right rotation Full with thoracic pain with overpressure  Left rotation Full with upper low back pain.    (Blank rows = not tested)  LOWER EXTREMITY ROM:     Passive  Right eval Left eval  Hip flexion    Hip extension    Hip abduction    Hip adduction    Hip internal rotation 25 with R hip pain 35 with R hip pain.   Hip external rotation 23 with L hip pain 45 with L hip pain  Knee flexion    Knee extension    Ankle dorsiflexion    Ankle plantarflexion    Ankle inversion    Ankle eversion     (Blank rows = not tested)  LOWER EXTREMITY MMT:    MMT Right eval Left eval  Hip flexion 4 4-  Hip extension 3- with back pain 3- with back pain  Hip abduction 4 4- with low back pain  Hip adduction    Hip internal rotation    Hip external rotation    Knee flexion 4 4+  Knee extension 5 5  Ankle dorsiflexion    Ankle plantarflexion    Ankle inversion    Ankle eversion     (Blank rows = not tested)  LUMBAR SPECIAL TESTS:  (+) repeated flexion test with R low back and R sciatic area pain to thigh.   Supine position: R LE longer Long sit position: R LE longer    FUNCTIONAL TESTS:    GAIT: Distance walked: 60 ft Assistive device utilized: None Level of assistance: Complete Independence Comments: decreased trunk rotatoin, slight decreased stance R LE, slight R thoracolumbar side bend during R LE stance phase  TREATMENT DATE: 10/14/2023  Neuromuscular re education Performed to improve posture, trunk strength, trunk mobility to decrease stress to affected areas and nerves   Seated B scapular retraction green band 10x5 seconds for 3 sets  To promote thoracic extension   Seated trunk rotation 10x5 seconds each  direction  Seated B shoulder horizontal abduction for pectoralis stretch as well as thoracic extension, pain free range 10x5 seconds  Seated manually resisted L upper trunk rotation isometrics in neutral 10x2 with 5 second holds  Slight decrease in thoracolumbar pressure during holds  Performed to promote more neutral thoracolumbar posture.  First rib stretch with strap  With cervical rotation    R 10x   With contralateral cervical side bend    R 30 seconds x 3   L 30 seconds x 3  Seated B shoulder extension green band 10x2  Then red band 10x   Decreased thoracolumbar discomfort.   Improved posture observed (decreased L thoracolumbar rotation)   Sitting with L thoracolumbar towel roll   Decreased thoracolumbar pressure.     Improved technique, movement at target joints, use of target muscles after mod verbal, visual, tactile cues.       PATIENT EDUCATION:  Education details: there-ex, HEP, POC Person educated: Patient Education method: Explanation, Demonstration, Tactile cues, Verbal cues, and Handouts Education comprehension: verbalized understanding and returned demonstration  HOME EXERCISE PROGRAM: Access Code: [email or web address] URL: [email or web address] Date: 09/23/2023 Prepared by: Emil Glassman  Exercises - Seated Piriformis Stretch  - 3 x daily - 7 x weekly - 1 sets - 5 reps - 30 seconds hold - Hooklying Single Knee to Chest Stretch  - 3 x daily - 7 x weekly - 3 sets - 10 reps - 10 seconds  hold (DISCONTINUED on 09/30/2023)   - Standing Lumbar Extension  - 2 x daily - 7 x weekly - 3 sets - 10 reps - 5 seconds hold  - Shoulder extension with resistance - Neutral  - 1 x daily - 7 x weekly - 2-3 sets - 10 reps  Yellow band  - First Rib Mobilization with Strap  - 3 x daily - 7 x weekly - 1 sets - 5 reps - 30 seconds  hold - Seated Shoulder Extension and Scapular Retraction with Resistance  - 1 x daily - 7 x weekly - 3 sets - 10  reps   ASSESSMENT:  CLINICAL IMPRESSION: Decreased thoracolumbar pressure/pain with treatment to decrease L thoracolumbar rotation. Worked on trunk strengthening and thoracic extension to promote movement throughout spine as well as decrease stress to affected areas. Pt tolerated session well without aggravation of symptoms. Pt will benefit from continued skilled physical therapy services to decrease pain, improve strength and function.      OBJECTIVE IMPAIRMENTS: decreased ROM, decreased strength, improper body mechanics, postural dysfunction, and pain.   ACTIVITY LIMITATIONS: lifting, bending, sleeping, stairs, and transfers  PARTICIPATION LIMITATIONS:   PERSONAL FACTORS: Fitness, Past/current experiences, Time since onset of injury/illness/exacerbation, and 3+ comorbidities: anxiety, DM, insomnia, depression are also affecting patient's functional outcome.   REHAB POTENTIAL: Fair    CLINICAL DECISION MAKING: Evolving/moderate complexity Pain is worsening based on subjective reports  EVALUATION COMPLEXITY: Moderate   GOALS: Goals reviewed with patient? Yes  SHORT TERM GOALS: Target date: 10/03/2023  Pt will be independent with her initial HEP to decrease pain and improve strength, and function. Baseline: Goal status: INITIAL   LONG TERM GOALS: Target date: 11/21/2023  Pt will have a decrease in thoracic and upper low back pain to  3/10 or less at worst to promote ability to don and doff clothes, perform transfers, tolerate the supine and side lying positions more comfortably.  Baseline: Thoracic and upper low back pain. 9/10 at most for the past 3 months (09/23/2023) Goal status: INITIAL  2.  Pt will improve B hip strength by at least 1/2 MMT to promote ability to perform standing tasks more comfortably.  Baseline:  MMT Right eval Left eval  Hip flexion 4 4-  Hip extension 3- with back pain 3- with back pain  Hip abduction 4 4- with low back pain   Goal status:  INITIAL  3.  Pt will improve her Modified Oswestry Low back Pain disability Questionnaire by at least 10% as a demonstration of improved function.  Baseline: 12/50 (24%) (09/23/2023) Goal status: INITIAL   PLAN:  PT FREQUENCY: 1-2x/week  PT DURATION: 8 weeks  PLANNED INTERVENTIONS: 97110-Therapeutic exercises, 97530- Therapeutic activity, 97112- Neuromuscular re-education, 97535- Self Care, [id number]- Manual therapy, [id number]- Electrical stimulation (unattended), [phone]- Traction (mechanical), [id number]- Ionotophoresis 4mg /ml Dexamethasone, Patient/Family education, Joint mobilization, Spinal manipulation, and Spinal mobilization.  PLAN FOR NEXT SESSION: Posture, scapular, trunk, and hip strengthening, thoracic extension, manual techniques, modalities PRN.    Omah Dewalt, PT, DPT 10/14/2023, 4:13 PM

## 2023-10-17 ENCOUNTER — Telehealth: Payer: Self-pay

## 2023-10-17 ENCOUNTER — Ambulatory Visit: Attending: Surgical

## 2023-10-17 DIAGNOSIS — M542 Cervicalgia: Secondary | ICD-10-CM | POA: Insufficient documentation

## 2023-10-17 DIAGNOSIS — M546 Pain in thoracic spine: Secondary | ICD-10-CM | POA: Insufficient documentation

## 2023-10-17 DIAGNOSIS — M5459 Other low back pain: Secondary | ICD-10-CM | POA: Insufficient documentation

## 2023-10-17 NOTE — Telephone Encounter (Signed)
 No show. Called patient and left a message pertaining to appointment and a reminder for the next follow up session. Return phone call requested. Phone number [phone]) provided.

## 2023-10-21 ENCOUNTER — Ambulatory Visit

## 2023-10-21 DIAGNOSIS — M5459 Other low back pain: Secondary | ICD-10-CM

## 2023-10-21 DIAGNOSIS — M542 Cervicalgia: Secondary | ICD-10-CM | POA: Diagnosis not present

## 2023-10-21 DIAGNOSIS — M546 Pain in thoracic spine: Secondary | ICD-10-CM

## 2023-10-21 NOTE — Therapy (Signed)
 OUTPATIENT PHYSICAL THERAPY TREATMENT   Patient Name: Jacqueline Padilla MRN: [id number] DOB:17-Sep-1975, 48 y.o., female Today's Date: 10/21/2023  END OF SESSION:  PT End of Session - 10/21/23 1119     Visit Number 6    Number of Visits 18    Date for Recertification  11/21/23    Progress Note Due on Visit 10    PT Start Time 1119    PT Stop Time 1207    PT Time Calculation (min) 48 min    Activity Tolerance Patient tolerated treatment well;No increased pain;Patient limited by pain    Behavior During Therapy [facility] for tasks assessed/performed              Past Medical History:  Diagnosis Date   Anemia    Anxiety    Asthma    Bipolar disorder, mixed (HCC)    Depression    Diabetes mellitus without complication (HCC)    Generalized anxiety disorder    Herpes    1/2 +   History of chicken pox    History of urinary tract infection    Insomnia    Iron deficiency    Vitamin D  deficiency    Past Surgical History:  Procedure Laterality Date   BELPHAROPTOSIS REPAIR Bilateral    eye lid surgery   COLONOSCOPY WITH PROPOFOL  N/A 08/26/2022   Procedure: COLONOSCOPY WITH PROPOFOL ;  Surgeon: Jinny Carmine, MD;  Location: [facility];  Service: Endoscopy;  Laterality: N/A;   DILATION AND CURETTAGE OF UTERUS  01/15/1999   s/p miscarriage   ENDOMETRIAL ABLATION  01/15/2004   hx of abnormal pap   POLYPECTOMY  08/26/2022   Procedure: POLYPECTOMY;  Surgeon: Jinny Carmine, MD;  Location: [facility];  Service: Endoscopy;;   TONSILLECTOMY AND ADENOIDECTOMY  01/15/1992   TUBAL LIGATION  01/15/2004   Patient Active Problem List   Diagnosis Date Noted   Type 2 diabetes mellitus with hyperglycemia, without long-term current use of insulin  (HCC) 12/26/2022   Polyp of sigmoid colon 08/26/2022   Encounter for screening colonoscopy 08/26/2022   Obesity (BMI 30-39.9) 08/07/2022   IUD (intrauterine device) in place 07/26/2019   Depression, recurrent 06/23/2019    Abnormal uterine bleeding 04/17/2018   Insomnia 02/04/2018   Snoring 02/04/2018   GAD (generalized anxiety disorder) 07/10/2017   Iron deficiency anemia 07/10/2017   Vitamin D  deficiency 06/23/2017   Tobacco abuse 06/18/2017   Anxiety and depression 06/18/2017   Anemia 06/18/2017   STD exposure 05/30/2017   Muscle spasm 05/30/2017   Mixed hyperlipidemia 06/01/2015   Type 2 diabetes with complication (HCC) 06/01/2015   Diabetic eye exam (HCC) 03/08/2014   Hx of abnormal cervical Pap smear 05/05/2013    PCP:    Bernardo Fend, DO    REFERRING PROVIDER: Scheeler, Donnice PARAS, PA-C  REFERRING DIAG: Diagnosis N62 (ICD-10-CM) - Macromastia M54.6 (ICD-10-CM) - Bilateral thoracic back pain, unspecified chronicity  Rationale for Evaluation and Treatment: Rehabilitation  THERAPY DIAG:  Other low back pain  Pain in thoracic spine  Cervicalgia  ONSET DATE: 07/31/2023 (Date PT referral signed. Chronic condition)  SUBJECTIVE:  SUBJECTIVE STATEMENT: L low back is hurting 8/10 currently. Started bothering her more since the past couple of days. Has been taking muscle relaxers but it knocks her out and puts her to sleep.   Mid back is a 5-6-7/10 currently.          PERTINENT HISTORY:  Thoracic and upper low back pain. Pt also has B shoulder pain around the bra strap area, B upper trap area and lower cervical spine.  Pain is chronic. Pain has been bothering her for the last 5 years. Looking into getting a breast reduction surgery around May 2026.   Pt also reports L low back pain with L L5 dermatome symptoms to ankle.   No blood pressure problems per pt.  No latex allergies  PAIN:  Are you having pain? Yes: NPRS scale: 9/10 Pain location: Thoracic and upper low back pain. Pain  description: tight, tender, sore.  Aggravating factors: getting up and down from a seated position, supine, R or L S/L position, raising her arm, donning and doffing shirts and bra, bending over to pick up items from the floor; stair negotiation at times.  Relieving factors: leaning back, seated trunk flexion, lifting her breasts up relieves back pressure.   PRECAUTIONS: None  RED FLAGS: Bowel or bladder incontinence: No and Cauda equina syndrome: No   WEIGHT BEARING RESTRICTIONS: No  FALLS:  Has patient fallen in last 6 months? 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   NEXT MD VISIT: none yet  OBJECTIVE:  Note: Objective measures were completed at Evaluation unless otherwise noted.  DIAGNOSTIC FINDINGS:    PATIENT SURVEYS:  Modified Oswestry:  MODIFIED OSWESTRY DISABILITY SCALE  Date: 09/23/2023 Score  Pain intensity 3 =  Pain medication provides me with moderate relief from pain.  2. Personal care (washing, dressing, etc.) 0 =  I can take care of myself normally without causing increased pain.  3. Lifting 1 = I can lift heavy weights, but it causes increased pain.  4. Walking 1 = Pain prevents me from walking more than 1 mile.  5. Sitting 1 =  I can only sit in my favorite chair as long as I like.  6. Standing 1 =  I can stand as long as I want but, it increases my pain.  7. Sleeping 1 = I can sleep well only by using pain medication.  8. Social Life 1 =  My social life is normal, but it increases my level of pain.  9. Traveling 1 =  I can travel anywhere, but it increases my pain.  10. Employment/ Homemaking 2 = I can perform most of my homemaking/job duties, but pain prevents me  from performing more physically stressful activities (eg, lifting, vacuuming).  Total 12/50 (24%)   Interpretation of scores: Score Category Description  0-20% Minimal Disability The patient can cope with most living activities. Usually no treatment is indicated apart from advice on lifting, sitting and exercise  21-40% Moderate Disability The patient experiences more pain and difficulty with sitting, lifting and standing. Travel and social life are more difficult and they may be disabled from work. Personal care, sexual activity and sleeping are not grossly affected, and the patient can usually be managed by conservative means  41-60% Severe Disability  Pain remains the main problem in this group, but activities of daily living are affected. These patients require a detailed investigation  61-80% Crippled Back pain impinges on all aspects of the patient's life. Positive intervention is required  81-100% Bed-bound  These patients are either bed-bound or exaggerating their symptoms  Bluford FORBES Zoe DELENA Karon DELENA, et al. Surgery versus conservative management of stable thoracolumbar fracture: the PRESTO feasibility RCT. [place]): [employer]; 2021 Nov. [facility] Technology Assessment, No. 25.62.) Appendix 3, Oswestry Disability Index category descriptors. Available from: FindJewelers.cz  Minimally Clinically Important Difference (MCID) = 12.8%  COGNITION: Overall cognitive status: Within functional limits for tasks assessed     SENSATION:   MUSCLE LENGTH:   POSTURE: forward neck, R shoulder slightly higher, movement crease/preference around C6/C7, as well as around C3/C4 area, slight L trunk rotation, L LE weight shift, R knee higher  Movement preference around L2/L3 area, slight R lateral shift around that area, slight R lumbar convexity around L2/3 area.   Decreased pain with manual R lateral shift correction.   PALPATION:   LUMBAR ROM:    AROM eval  Flexion WFL, with B lumbar thoracic paraspinal muscle tightness and ache  Extension WFL with lower thoracic and upper lumbar pain. No R LE symptoms.   Right lateral flexion WFL with L low back pain  Left lateral flexion WFL with R low back pain  Right rotation Full with thoracic pain with overpressure  Left rotation Full with upper low back pain.    (Blank rows = not tested)  LOWER EXTREMITY ROM:     Passive  Right eval Left eval  Hip flexion    Hip extension    Hip abduction    Hip adduction    Hip internal rotation 25 with R hip pain 35 with R hip pain.   Hip external rotation 23 with L hip pain 45 with L hip pain  Knee flexion    Knee extension    Ankle dorsiflexion    Ankle plantarflexion    Ankle inversion    Ankle eversion     (Blank rows = not tested)  LOWER EXTREMITY MMT:    MMT Right eval Left eval  Hip flexion 4 4-  Hip extension 3- with back pain 3- with back pain  Hip abduction 4 4- with low back pain  Hip adduction    Hip internal rotation    Hip external rotation    Knee flexion 4 4+  Knee extension 5 5  Ankle dorsiflexion    Ankle plantarflexion    Ankle inversion    Ankle eversion     (Blank rows = not tested)  LUMBAR SPECIAL TESTS:  (+) repeated flexion test with R low back and R sciatic area pain to thigh.   Supine position: R LE longer Long sit position: R LE longer    FUNCTIONAL TESTS:    GAIT: Distance walked: 60 ft Assistive device utilized: None Level of assistance: Complete Independence Comments: decreased trunk rotatoin, slight decreased stance R LE, slight R thoracolumbar side bend during R LE stance phase  TREATMENT DATE: 10/21/2023  Neuromuscular re education Performed to improve posture, trunk strength, trunk mobility to decrease stress to affected areas and nerves  Sitting  posture  L anterior pelvic rotation     Seated R trunk rotation 10x5 seconds for 2 sets  L trunk rotation increases pain  Reclined   Hip IR stretch with PT    R 1 min x 3   Hip at 90/90    Hip IR PROM with PT     R 10x3     L low back feels better per pt    L 10x3  Hooklying   Crunch to the R 10x3  Supine piriformis stretch   L 30 seconds x 3   R 30 seconds x 3  Decreased L low back pain after aforementioned exercises  Seated hip IR   L 10x5 seconds   R 10x5 seconds   Improved technique, movement at target joints, use of target muscles after mod verbal, visual, tactile cues.       PATIENT EDUCATION:  Education details: there-ex, HEP, POC Person educated: Patient Education method: Explanation, Demonstration, Tactile cues, Verbal cues, and Handouts Education comprehension: verbalized understanding and returned demonstration  HOME EXERCISE PROGRAM: Access Code: [email or web address] URL: [email or web address] Date: 09/23/2023 Prepared by: Emil Glassman  Exercises - Seated Piriformis Stretch  - 3 x daily - 7 x weekly - 1 sets - 5 reps - 30 seconds hold - Hooklying Single Knee to Chest Stretch  - 3 x daily - 7 x weekly - 3 sets - 10 reps - 10 seconds  hold (DISCONTINUED on 09/30/2023)   - Standing Lumbar Extension  - 2 x daily - 7 x weekly - 3 sets - 10 reps - 5 seconds hold  - Shoulder extension with resistance - Neutral  - 1 x daily - 7 x weekly - 2-3 sets - 10 reps  Yellow band  - First Rib Mobilization with Strap  - 3 x daily - 7 x weekly - 1 sets - 5 reps - 30 seconds  hold - Seated Shoulder Extension and Scapular Retraction with Resistance  - 1 x daily - 7 x weekly - 3 sets - 10 reps  - Supine Piriformis Stretch  - 3 x daily - 7 x weekly - 1 sets - 5 reps - 30 seconds to 1 minute hold - Curl Up with Arms Crossed  - 1 x daily - 7 x weekly - 3 sets - 10 reps - Seated Hip Internal Rotation AROM  - 3 x daily - 7 x weekly - 3 sets - 10  reps    ASSESSMENT:  CLINICAL IMPRESSION: Worked on improving B hip IR L > R, decreasing piriformis muscle tension and improving trunk strength to decrease L anterior pelvic rotation posture and stress to L low back. Decreased back pain reported after session. Pt will benefit from continued skilled physical therapy services to decrease pain, improve strength and function.      OBJECTIVE IMPAIRMENTS: decreased ROM, decreased strength, improper body mechanics, postural dysfunction, and pain.   ACTIVITY LIMITATIONS: lifting, bending, sleeping, stairs, and transfers  PARTICIPATION LIMITATIONS:   PERSONAL FACTORS: Fitness, Past/current experiences, Time since onset of injury/illness/exacerbation, and 3+ comorbidities: anxiety, DM, insomnia, depression are also affecting patient's functional outcome.   REHAB POTENTIAL: Fair    CLINICAL DECISION MAKING: Evolving/moderate complexity Pain is worsening based on subjective reports  EVALUATION COMPLEXITY: Moderate   GOALS: Goals reviewed with patient? Yes  SHORT TERM GOALS: Target date: 10/03/2023  Pt will  be independent with her initial HEP to decrease pain and improve strength, and function. Baseline: Goal status: INITIAL   LONG TERM GOALS: Target date: 11/21/2023  Pt will have a decrease in thoracic and upper low back pain to 3/10 or less at worst to promote ability to don and doff clothes, perform transfers, tolerate the supine and side lying positions more comfortably.  Baseline: Thoracic and upper low back pain. 9/10 at most for the past 3 months (09/23/2023) Goal status: INITIAL  2.  Pt will improve B hip strength by at least 1/2 MMT to promote ability to perform standing tasks more comfortably.  Baseline:  MMT Right eval Left eval  Hip flexion 4 4-  Hip extension 3- with back pain 3- with back pain  Hip abduction 4 4- with low back pain   Goal status: INITIAL  3.  Pt will improve her Modified Oswestry Low back Pain  disability Questionnaire by at least 10% as a demonstration of improved function.  Baseline: 12/50 (24%) (09/23/2023) Goal status: INITIAL   PLAN:  PT FREQUENCY: 1-2x/week  PT DURATION: 8 weeks  PLANNED INTERVENTIONS: 97110-Therapeutic exercises, 97530- Therapeutic activity, 97112- Neuromuscular re-education, 97535- Self Care, 02859- Manual therapy, G0283- Electrical stimulation (unattended), [phone]- Traction (mechanical), [id number]- Ionotophoresis 4mg /ml Dexamethasone, Patient/Family education, Joint mobilization, Spinal manipulation, and Spinal mobilization.  PLAN FOR NEXT SESSION: Posture, scapular, trunk, and hip strengthening, thoracic extension, manual techniques, modalities PRN.    Delmy Holdren, PT, DPT 10/21/2023, 12:22 PM

## 2023-10-27 ENCOUNTER — Encounter: Payer: Self-pay | Admitting: Internal Medicine

## 2023-10-27 NOTE — Progress Notes (Unsigned)
 PCP: Jacqueline Fend, DO   No chief complaint on file.   HPI:      [name] is a 48 y.o. G2P1011 whose LMP was No LMP recorded. (Menstrual status: IUD)., presents today for her annual examination.  Her menses are random with IUD, lasting 3-5 days, light flow, mild dysmen/LBP. Would like Rx RF naproxen  prn dysmen. Mirena  IUD placed 04/23/18 for cycle control.   Sex activity: single partner, contraception - tubal ligation. She does not have vaginal dryness/pain/bleeding.   Last Pap: 07/26/19 Results were: no abnormalities /neg HPV DNA.  Hx of STDs: trichomonas on 7/21 pap  Last mammogram: 07/14/23 Results were: normal--routine follow-up in 12 months There is a FH of breast cancer in her PGM, genetic testing not done, age unknown. There is no FH of ovarian cancer. The patient does do self-breast exams.  Colonoscopy: 8/24 at [facility] with non-cancerous polyp; repeat due after 10 yrs  Tobacco use: The patient denies current or previous tobacco use. Alcohol use: none No drug use Exercise: min active  She does get adequate calcium  but not Vitamin D  in her diet.  Labs with PCP.   Patient Active Problem List   Diagnosis Date Noted   Type 2 diabetes mellitus with hyperglycemia, without long-term current use of insulin  (HCC) 12/26/2022   Polyp of sigmoid colon 08/26/2022   Encounter for screening colonoscopy 08/26/2022   Obesity (BMI 30-39.9) 08/07/2022   IUD (intrauterine device) in place 07/26/2019   Depression, recurrent 06/23/2019   Abnormal uterine bleeding 04/17/2018   Insomnia 02/04/2018   Snoring 02/04/2018   GAD (generalized anxiety disorder) 07/10/2017   Iron deficiency anemia 07/10/2017   Vitamin D  deficiency 06/23/2017   Tobacco abuse 06/18/2017   Anxiety and depression 06/18/2017   Anemia 06/18/2017   STD exposure 05/30/2017   Muscle spasm 05/30/2017   Mixed hyperlipidemia 06/01/2015   Type 2 diabetes with complication (HCC) 06/01/2015    Diabetic eye exam (HCC) 03/08/2014   Hx of abnormal cervical Pap smear 05/05/2013    Past Surgical History:  Procedure Laterality Date   BELPHAROPTOSIS REPAIR Bilateral    eye lid surgery   COLONOSCOPY WITH PROPOFOL  N/A 08/26/2022   Procedure: COLONOSCOPY WITH PROPOFOL ;  Surgeon: Jacqueline Carmine, MD;  Location: [facility];  Service: Endoscopy;  Laterality: N/A;   DILATION AND CURETTAGE OF UTERUS  01/15/1999   s/p miscarriage   ENDOMETRIAL ABLATION  01/15/2004   hx of abnormal pap   POLYPECTOMY  08/26/2022   Procedure: POLYPECTOMY;  Surgeon: Jacqueline Carmine, MD;  Location: [facility];  Service: Endoscopy;;   TONSILLECTOMY AND ADENOIDECTOMY  01/15/1992   TUBAL LIGATION  01/15/2004    Family History  Problem Relation Age of Onset   Diabetes Mother    Hypertension Mother    Hypertension Father    Heart disease Father        CABG   Bipolar disorder Sister    Anxiety disorder Brother    Depression Brother    Stroke Maternal Grandmother    Cancer Maternal Grandfather        Lung cancer - non smoker   Breast cancer Paternal Grandmother        age unknown   Bipolar disorder Cousin    Stomach cancer Other        maternal uncle   Ovarian cancer Neg Hx    Colon cancer Neg Hx     Social History   Socioeconomic History   Marital status: Married  Spouse name: Not on file   Number of children: 1   Years of education: 15   Highest education level: Not on file  Occupational History   Occupation: Unit Clerk    Comment: [employer]   Occupation: Advertising copywriter    Comment: [employer]  Tobacco Use   Smoking status: Former    Current packs/day: 0.25    Average packs/day: 0.3 packs/day for 19.2 years (4.8 ttl pk-yrs)    Types: Cigarettes    Start date: 07/14/2017   Smokeless tobacco: Never  Vaping Use   Vaping status: Never Used  Substance and Sexual Activity   Alcohol use: Yes    Alcohol/week: 0.0 standard drinks of alcohol    Comment: Occassional  glass of wine   Drug use: No   Sexual activity: Yes    Partners: Male    Birth control/protection: Surgical, I.U.D.    Comment: Mirena /Tubal ligation  Other Topics Concern   Not on file  Social History Narrative   Jacqueline Padilla grew up in [place] , [place]. She lives in [place] with her Husband and their son. Her husband has 3 daughters that live with them as well. She has 53 y.o son as of 06/2017. 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 She enjoys spending time with the family, outdoor activities, shopping.   Married x 16 years       Exercise - not currently   Caffeine - 1 cup daily   Social Drivers of Corporate investment banker Strain: Not on file  Food Insecurity: Not on file  Transportation Needs: Not on file  Physical Activity: Not on file  Stress: Not on file  Social Connections: Not on file  Intimate Partner Violence: Not on file     Current Outpatient Medications:    OZEMPIC , 0.25 OR 0.5 MG/DOSE, 2 MG/3ML SOPN, INJECT 0.5MG  SUBCUTANEOUSLY ONCE A WEEK, Disp: 6 mL, Rfl: 0   rosuvastatin  (CRESTOR ) 10 MG tablet, Take 1 tablet (10 mg total) by mouth daily., Disp: 90 tablet, Rfl: 1   tiZANidine  (ZANAFLEX ) 4 MG tablet, Take 1 tablet (4 mg total) by mouth at bedtime as needed., Disp: 30 tablet, Rfl: 0     ROS:  Review of Systems  Constitutional:  Positive for fatigue. Negative for fever and unexpected weight change.  Respiratory:  Negative for cough, shortness of breath and wheezing.   Cardiovascular:  Negative for chest pain, palpitations and leg swelling.  Gastrointestinal:  Negative for blood in stool, constipation, diarrhea, nausea and vomiting.  Endocrine: Negative for cold intolerance, heat intolerance and polyuria.  Genitourinary:  Negative for dyspareunia, dysuria, flank pain, frequency, genital sores, hematuria, menstrual problem, pelvic pain, urgency, vaginal bleeding, vaginal  discharge and vaginal pain.  Musculoskeletal:  Negative for back pain, joint swelling and myalgias.  Skin:  Negative for rash.  Neurological:  Negative for dizziness, syncope, light-headedness, numbness and headaches.  Hematological:  Negative for adenopathy.  Psychiatric/Behavioral:  Negative for agitation, confusion, sleep disturbance and suicidal ideas. The patient is not nervous/anxious.    BREAST: No symptoms    Objective: There were no vitals taken for this visit.   Physical Exam Constitutional:      Appearance: She is well-developed.  Genitourinary:     Vulva normal.     Right Labia: No rash, tenderness or lesions.    Left Labia: No tenderness, lesions or rash.    No  vaginal discharge, erythema or tenderness.      Right Adnexa: not tender and no mass present.    Left Adnexa: not tender and no mass present.    No cervical friability or polyp.     IUD strings visualized.     Uterus is not enlarged or tender.  Breasts:    Right: No mass, nipple discharge, skin change or tenderness.     Left: No mass, nipple discharge, skin change or tenderness.  Neck:     Thyroid : No thyromegaly.  Cardiovascular:     Rate and Rhythm: Normal rate and regular rhythm.     Heart sounds: Normal heart sounds. No murmur heard. Pulmonary:     Effort: Pulmonary effort is normal.     Breath sounds: Normal breath sounds.  Abdominal:     Palpations: Abdomen is soft.     Tenderness: There is no abdominal tenderness. There is no guarding or rebound.  Musculoskeletal:        General: Normal range of motion.     Cervical back: Normal range of motion.  Lymphadenopathy:     Cervical: No cervical adenopathy.  Neurological:     General: No focal deficit present.     Mental Status: She is alert and oriented to person, place, and time.     Cranial Nerves: No cranial nerve deficit.  Skin:    General: Skin is warm and dry.  Psychiatric:        Mood and Affect: Mood normal.        Behavior:  Behavior normal.        Thought Content: Thought content normal.        Judgment: Judgment normal.  Vitals reviewed.    Assessment/Plan:  Encounter for annual routine gynecological examination  Encounter for screening mammogram for malignant neoplasm of breast - Plan: MM 3D SCREENING MAMMOGRAM BILATERAL BREAST; pt to schedule mammo  Encounter for routine checking of intrauterine contraceptive device (IUD); has 8 yr indication  Dysmenorrhea - Plan: naproxen  (NAPROSYN ) 500 MG tablet; Rx eRxd. F/u prn.   Screening for colon cancer - Plan: Ambulatory referral to Gastroenterology; refer to Dr. Jinny per pt pref.   No orders of the defined types were placed in this encounter.        GYN counsel breast self exam, mammography screening, adequate intake of calcium  and vitamin D , diet and exercise    F/U  No follow-ups on file.  Lennart Gladish B. Sabrina Keough, PA-C 10/27/2023 3:29 PM

## 2023-10-28 ENCOUNTER — Ambulatory Visit: Admitting: Surgical

## 2023-10-28 ENCOUNTER — Ambulatory Visit: Admitting: Obstetrics and Gynecology

## 2023-10-28 ENCOUNTER — Encounter: Payer: Self-pay | Admitting: Obstetrics and Gynecology

## 2023-10-28 ENCOUNTER — Other Ambulatory Visit (HOSPITAL_COMMUNITY)
Admission: RE | Admit: 2023-10-28 | Discharge: 2023-10-28 | Disposition: A | Discharge status: Home / Self Care | Source: Ambulatory Visit | Attending: Obstetrics and Gynecology | Admitting: Obstetrics and Gynecology

## 2023-10-28 VITALS — Wt 155.0 lb

## 2023-10-28 VITALS — BP 122/89 | HR 111 | Ht 61.0 in | Wt 157.1 lb

## 2023-10-28 DIAGNOSIS — Z1231 Encounter for screening mammogram for malignant neoplasm of breast: Secondary | ICD-10-CM

## 2023-10-28 DIAGNOSIS — Z124 Encounter for screening for malignant neoplasm of cervix: Secondary | ICD-10-CM | POA: Insufficient documentation

## 2023-10-28 DIAGNOSIS — M542 Cervicalgia: Secondary | ICD-10-CM

## 2023-10-28 DIAGNOSIS — N62 Hypertrophy of breast: Secondary | ICD-10-CM

## 2023-10-28 DIAGNOSIS — M546 Pain in thoracic spine: Secondary | ICD-10-CM

## 2023-10-28 DIAGNOSIS — Z01411 Encounter for gynecological examination (general) (routine) with abnormal findings: Secondary | ICD-10-CM | POA: Diagnosis not present

## 2023-10-28 DIAGNOSIS — Z1151 Encounter for screening for human papillomavirus (HPV): Secondary | ICD-10-CM

## 2023-10-28 DIAGNOSIS — Z30431 Encounter for routine checking of intrauterine contraceptive device: Secondary | ICD-10-CM

## 2023-10-28 DIAGNOSIS — N946 Dysmenorrhea, unspecified: Secondary | ICD-10-CM | POA: Diagnosis not present

## 2023-10-28 DIAGNOSIS — Z01419 Encounter for gynecological examination (general) (routine) without abnormal findings: Secondary | ICD-10-CM

## 2023-10-28 MED ORDER — NAPROXEN 500 MG PO TABS: 500.0000 mg | ORAL_TABLET | Freq: Two times a day (BID) | ORAL | 1 refills | Status: AC

## 2023-10-28 NOTE — Patient Instructions (Signed)
 I value your feedback and you entrusting [place] with your care. If you get a [place] patient survey, I would appreciate you taking the time to let us know about your experience today. Thank you! ? ? ?

## 2023-10-28 NOTE — Progress Notes (Signed)
   Referring Provider Bernardo Fend, DO [address] [place],  [place] [address]   CC:  Chief Complaint  Patient presents with   Follow-up      Jacqueline Padilla is an 48 y.o. female.  HPI: Patient is a 48 y.o. 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 She reports that she is still having upper back, mid back, shoulder and neck pain.  She reports physical therapy was helpful, but unfortunately she continues to have the pain in the areas described above.  She does notice relief when she lifts her breasts up manually.  She also has mid back pain where her bra strap connects.  She is interested in continuing to pursue surgical intervention for symptomatic mammary hypertrophy.  Review of Systems General: Positive back pain, shoulder pain and neck pain  Physical Exam    10/28/2023    3:22 PM 10/28/2023    9:57 AM 09/26/2023   10:59 AM  Vitals with BMI  Height  5' 1 5' 1  Weight 155 lbs 157 lbs 2 oz 157 lbs 5 oz  BMI 29.3 29.7 29.74  Systolic  122 118  Diastolic  89 76  Pulse  111 95    General:  No acute distress,  Alert and oriented, Non-Toxic, Normal speech and affect Psych: Normal behavior and mood    Assessment/Plan Patient is interested in pursuing surgical intervention for bilateral breast reduction. Patient has completed at least 6 weeks of physical therapy for pain related to macromastia.  Discussed with patient we would submit to insurance for authorization, discussed approval could take up to 6 weeks.   Will resubmit for review.  Jacqueline Padilla 10/28/2023, 3:27 PM

## 2023-10-29 ENCOUNTER — Ambulatory Visit: Payer: Self-pay | Admitting: Obstetrics and Gynecology

## 2023-10-29 NOTE — Progress Notes (Signed)
 Pls have lab add gon/chlam/trich given pap results. Thx.

## 2023-10-30 ENCOUNTER — Ambulatory Visit: Admitting: Internal Medicine

## 2023-10-30 LAB — CYTOLOGY - PAP
Comment: NEGATIVE
Comment: NEGATIVE
Comment: NEGATIVE
Comment: NORMAL
Diagnosis: NEGATIVE
Diagnosis: NEGATIVE
High risk HPV: NEGATIVE
Trichomonas: POSITIVE — AB

## 2023-10-30 MED ORDER — METRONIDAZOLE 500 MG PO TABS: ORAL_TABLET | ORAL | 0 refills | Status: AC

## 2023-10-30 NOTE — Addendum Note (Signed)
 Addended by: WATT HILA B on: 10/30/2023 04:51 PM   Modules accepted: Orders

## 2023-11-19 ENCOUNTER — Encounter: Payer: Self-pay | Admitting: Internal Medicine

## 2023-11-19 ENCOUNTER — Ambulatory Visit: Admitting: Internal Medicine

## 2023-11-19 ENCOUNTER — Other Ambulatory Visit: Payer: Self-pay | Admitting: Internal Medicine

## 2023-11-19 ENCOUNTER — Other Ambulatory Visit: Payer: Self-pay

## 2023-11-19 VITALS — BP 120/78 | HR 83 | Temp 98.5°F | Resp 16 | Ht 61.0 in | Wt 159.9 lb

## 2023-11-19 DIAGNOSIS — G4700 Insomnia, unspecified: Secondary | ICD-10-CM | POA: Diagnosis not present

## 2023-11-19 DIAGNOSIS — M62838 Other muscle spasm: Secondary | ICD-10-CM

## 2023-11-19 MED ORDER — TRAZODONE HCL 50 MG PO TABS: 25.0000 mg | ORAL_TABLET | Freq: Every evening | ORAL | 1 refills | Status: AC | PRN

## 2023-11-19 NOTE — Progress Notes (Signed)
 Acute Office Visit  Subjective:     Patient ID: Jacqueline Padilla, female    DOB: 13-Jun-1975, 48 y.o.   MRN: [id number]  Chief Complaint  Patient presents with   Insomnia    Insomnia   Patient is in today for insomnia.   Discussed the use of AI scribe software for clinical note transcription with the patient, who gave verbal consent to proceed.  History of Present Illness Jacqueline Padilla is a 48 year old female who presents with trouble sleeping.  She experiences difficulty staying asleep, waking between 3:00 and 5:00 AM despite going to bed at midnight. She feels as though her sleep cycle is complete but experiences tiredness and irritability if she does not return to sleep. She has no difficulty falling asleep but struggles with an active mind that keeps her awake for about an hour. She is concerned about using sleep aids like Ambien due to potential side effects and habit formation. She is sensitive to Benadryl, tolerating only half a tablet without adverse effects. She has previously taken Zoloft  and experienced vivid dreams with certain medications. She has not been on psychiatric medications for over a year and manages her depression and stress independently. She believes her sleep issues may be related to stress and anxiety. She used to snore but does not experience waking up gasping for air.   Review of Systems  Psychiatric/Behavioral:  The patient has insomnia.         Objective:    BP 120/78 (Cuff Size: Large)   Pulse 83   Temp 98.5 F (36.9 C) (Oral)   Resp 16   Ht 5' 1 (1.549 m)   Wt 159 lb 14.4 oz (72.5 kg)   SpO2 98%   BMI 30.21 kg/m  BP Readings from Last 3 Encounters:  11/19/23 120/78  10/28/23 122/89  09/26/23 118/76   Wt Readings from Last 3 Encounters:  11/19/23 159 lb 14.4 oz (72.5 kg)  10/28/23 155 lb (70.3 kg)  10/28/23 157 lb 1.6 oz (71.3 kg)      Physical Exam Constitutional:      Appearance: Normal appearance.  HENT:      Head: Normocephalic and atraumatic.  Eyes:     Conjunctiva/sclera: Conjunctivae normal.  Cardiovascular:     Rate and Rhythm: Normal rate and regular rhythm.  Pulmonary:     Effort: Pulmonary effort is normal.     Breath sounds: Normal breath sounds.  Skin:    General: Skin is warm and dry.  Neurological:     General: No focal deficit present.     Mental Status: She is alert. Mental status is at baseline.  Psychiatric:        Mood and Affect: Mood normal.        Behavior: Behavior normal.     No results found for any visits on 11/19/23.      Assessment & Plan:   Assessment & Plan Insomnia Difficulty staying asleep, waking early, irritability, and fatigue. Discussed sleep hygiene and stress/anxiety impact. Concerns about habit-forming medications. Trazodone chosen for sleep and mood stabilization. - Start trazodone 25 mg at bedtime, increase to 50 mg if needed. - Consider melatonin 3 mg extended release as alternative or adjunct. - Evaluate trazodone effectiveness at December follow-up. - If ineffective, consider sleep study.  - traZODone (DESYREL) 50 MG tablet; Take 0.5-1 tablets (25-50 mg total) by mouth at bedtime as needed for sleep.  Dispense: 30 tablet; Refill: 1   Return for already  scheduled.  Sharyle Fischer, DO

## 2023-11-20 NOTE — Telephone Encounter (Signed)
 Requested medications are due for refill today.  yes  Requested medications are on the active medications list.  yes  Last refill. 09/26/2023 #30 0 rf  Future visit scheduled.   yes  Notes to clinic.  Refill not delegated    Requested Prescriptions  Pending Prescriptions Disp Refills   tiZANidine  (ZANAFLEX ) 4 MG tablet [Pharmacy Med Name: tiZANidine  HCl 4 MG Oral Tablet] 30 tablet 0    Sig: TAKE 1 TABLET BY MOUTH AT BEDTIME AS NEEDED     Not Delegated - Cardiovascular:  Alpha-2 Agonists - tizanidine  Failed - 11/20/2023  5:01 PM      Failed - This refill cannot be delegated      Passed - Valid encounter within last 6 months    Recent Outpatient Visits           Yesterday Insomnia, unspecified type   [facility] Bernardo Fend, DO   1 month ago Type 2 diabetes mellitus with hyperglycemia, without long-term current use of insulin  [facility])   [facility] Bernardo Fend, DO   4 months ago Type 2 diabetes mellitus with hyperglycemia, without long-term current use of insulin  [facility])   [facility] Bernardo Fend, DO   7 months ago Type 2 diabetes mellitus with hyperglycemia, without long-term current use of insulin  [facility])   [facility] Bernardo Fend, [place]

## 2023-11-26 DIAGNOSIS — Z419 Encounter for procedure for purposes other than remedying health state, unspecified: Secondary | ICD-10-CM | POA: Diagnosis not present

## 2023-12-19 ENCOUNTER — Other Ambulatory Visit: Payer: Self-pay | Admitting: Internal Medicine

## 2023-12-19 DIAGNOSIS — M62838 Other muscle spasm: Secondary | ICD-10-CM

## 2023-12-21 ENCOUNTER — Other Ambulatory Visit: Payer: Self-pay | Admitting: Internal Medicine

## 2023-12-21 DIAGNOSIS — E782 Mixed hyperlipidemia: Secondary | ICD-10-CM

## 2023-12-22 NOTE — Telephone Encounter (Signed)
 Requested medication (s) are due for refill today: yes  Requested medication (s) are on the active medication list: yes  Last refill:  09/26/23  Future visit scheduled: {Yes  Notes to clinic:  Unable to refill per protocol, cannot delegate.      Requested Prescriptions  Pending Prescriptions Disp Refills   tiZANidine  (ZANAFLEX ) 4 MG tablet [Pharmacy Med Name: tiZANidine  HCl 4 MG Oral Tablet] 30 tablet 0    Sig: TAKE 1 TABLET BY MOUTH AT BEDTIME AS NEEDED     Not Delegated - Cardiovascular:  Alpha-2 Agonists - tizanidine  Failed - 12/22/2023  3:52 PM      Failed - This refill cannot be delegated      Passed - Valid encounter within last 6 months    Recent Outpatient Visits           1 month ago Insomnia, unspecified type   [facility] Bernardo Fend, DO   2 months ago Type 2 diabetes mellitus with hyperglycemia, without long-term current use of insulin  [facility])   [facility] Bernardo Fend, DO   5 months ago Type 2 diabetes mellitus with hyperglycemia, without long-term current use of insulin  [facility])   [facility] Bernardo Fend, DO   9 months ago Type 2 diabetes mellitus with hyperglycemia, without long-term current use of insulin  [facility])   [facility] Bernardo Fend, [place]

## 2023-12-23 NOTE — Telephone Encounter (Signed)
 Requested Prescriptions  Pending Prescriptions Disp Refills   rosuvastatin  (CRESTOR ) 10 MG tablet [Pharmacy Med Name: Rosuvastatin  Calcium  10 MG Oral Tablet] 90 tablet 0    Sig: Take 1 tablet by mouth once daily     Cardiovascular:  Antilipid - Statins 2 Failed - 12/23/2023  2:53 PM      Failed - Lipid Panel in normal range within the last 12 months    Cholesterol  Date Value Ref Range Status  06/26/2023 228 (H) <200 mg/dL Final   LDL Cholesterol (Calc)  Date Value Ref Range Status  06/26/2023 161 (H) mg/dL (calc) Final    Comment:    Reference range: <100 . Desirable range <100 mg/dL for primary prevention;   <70 mg/dL for patients with CHD or diabetic patients  with > or = 2 CHD risk factors. SABRA LDL-C is now calculated using the Martin-Hopkins  calculation, which is a validated novel method providing  better accuracy than the Friedewald equation in the  estimation of LDL-C.  Gladis APPLETHWAITE et al. SANDREA. 7986;689(80): 2061-2068  (http://education.[employer].com/faq/FAQ164)    HDL  Date Value Ref Range Status  06/26/2023 48 (L) > OR = 50 mg/dL Final   Triglycerides  Date Value Ref Range Status  06/26/2023 88 <150 mg/dL Final         Passed - Cr in normal range and within 360 days    Creat  Date Value Ref Range Status  06/26/2023 0.83 0.50 - 0.99 mg/dL Final   Creatinine, Urine  Date Value Ref Range Status  06/26/2023 191 20 - 275 mg/dL Final         Passed - Patient is not pregnant      Passed - Valid encounter within last 12 months    Recent Outpatient Visits           1 month ago Insomnia, unspecified type   [facility] Bernardo Fend, DO   2 months ago Type 2 diabetes mellitus with hyperglycemia, without long-term current use of insulin  [facility])   [facility] Bernardo Fend, DO   6 months ago Type 2 diabetes mellitus with hyperglycemia, without long-term current use of insulin  [facility])   [facility]  [facility] Bernardo Fend, DO   9 months ago Type 2 diabetes mellitus with hyperglycemia, without long-term current use of insulin  [facility])   [facility] Bernardo Fend, [place]

## 2023-12-26 ENCOUNTER — Ambulatory Visit: Admitting: Internal Medicine

## 2023-12-26 DIAGNOSIS — Z419 Encounter for procedure for purposes other than remedying health state, unspecified: Secondary | ICD-10-CM | POA: Diagnosis not present

## 2023-12-28 ENCOUNTER — Other Ambulatory Visit: Payer: Self-pay | Admitting: Internal Medicine

## 2023-12-28 DIAGNOSIS — E1165 Type 2 diabetes mellitus with hyperglycemia: Secondary | ICD-10-CM

## 2023-12-29 ENCOUNTER — Ambulatory Visit: Admitting: Internal Medicine

## 2023-12-30 NOTE — Telephone Encounter (Signed)
 Requested Prescriptions  Pending Prescriptions Disp Refills   OZEMPIC , 0.25 OR 0.5 MG/DOSE, 2 MG/3ML SOPN [Pharmacy Med Name: Ozempic  (0.25 or 0.5 MG/DOSE) 2 MG/3ML Subcutaneous Solution Pen-injector] 9 mL 0    Sig: INJECT 0.5MG  SUBCUTANEOUSLY ONCE A WEEK     Endocrinology:  Diabetes - GLP-1 Receptor Agonists - semaglutide  Failed - 12/30/2023  4:11 PM      Failed - HBA1C in normal range and within 180 days    Hemoglobin A1C  Date Value Ref Range Status  09/26/2023 6.0 (A) 4.0 - 5.6 % Final  05/27/2019 6.3  Final   Hgb A1c MFr Bld  Date Value Ref Range Status  06/26/2023 5.9 (H) <5.7 % Final    Comment:    For someone without known diabetes, a hemoglobin  A1c value between 5.7% and 6.4% is consistent with prediabetes and should be confirmed with a  follow-up test. . For someone with known diabetes, a value <7% indicates that their diabetes is well controlled. A1c targets should be individualized based on duration of diabetes, age, comorbid conditions, and other considerations. . This assay result is consistent with an increased risk of diabetes. . Currently, no consensus exists regarding use of hemoglobin A1c for diagnosis of diabetes for children. .          Passed - Cr in normal range and within 360 days    Creat  Date Value Ref Range Status  06/26/2023 0.83 0.50 - 0.99 mg/dL Final   Creatinine, Urine  Date Value Ref Range Status  06/26/2023 191 20 - 275 mg/dL Final         Passed - Valid encounter within last 6 months    Recent Outpatient Visits           1 month ago Insomnia, unspecified type   [facility] Bernardo Fend, DO   3 months ago Type 2 diabetes mellitus with hyperglycemia, without long-term current use of insulin  [facility])   [facility] Bernardo Fend, DO   6 months ago Type 2 diabetes mellitus with hyperglycemia, without long-term current use of insulin  [facility])   [facility] Bernardo Fend, DO   9 months ago Type 2 diabetes mellitus with hyperglycemia, without long-term current use of insulin  [facility])   [facility] Bernardo Fend, [place]

## 2024-02-03 ENCOUNTER — Ambulatory Visit: Admitting: Internal Medicine

## 2024-02-05 ENCOUNTER — Telehealth: Payer: Self-pay

## 2024-02-05 NOTE — Telephone Encounter (Signed)
 Copied from CRM #[id number]. Topic: Clinical - Medical Advice >> Feb 05, 2024  3:28 PM Lonell PEDLAR wrote: Reason for CRM: Patient called to advise that she missed her apt on 1/20 due to confusing the date with 1/23. Patient r/s for 2/10 and is worried this may affect her medication for Ozempic . Please contact patient to best advise. [phone]

## 2024-02-05 NOTE — Telephone Encounter (Signed)
 FYI

## 2024-02-13 NOTE — Telephone Encounter (Signed)
 Lvm for pt to call back if she needs a sooner appt

## 2024-02-24 ENCOUNTER — Ambulatory Visit: Admitting: Internal Medicine
